# Patient Record
Sex: Female | Born: 1950
Health system: Southern US, Community
[De-identification: ages and names within clinical notes are randomized; demographics above are authoritative.]

## PROBLEM LIST (undated history)

## (undated) DIAGNOSIS — E669 Obesity, unspecified: Secondary | ICD-10-CM

## (undated) DIAGNOSIS — G4733 Obstructive sleep apnea (adult) (pediatric): Secondary | ICD-10-CM

## (undated) DIAGNOSIS — E785 Hyperlipidemia, unspecified: Secondary | ICD-10-CM

## (undated) DIAGNOSIS — I10 Essential (primary) hypertension: Secondary | ICD-10-CM

## (undated) DIAGNOSIS — E119 Type 2 diabetes mellitus without complications: Secondary | ICD-10-CM

## (undated) HISTORY — DX: Obstructive sleep apnea (adult) (pediatric): G47.33

## (undated) HISTORY — DX: Obesity, unspecified: E66.9

## (undated) HISTORY — PX: ABDOMINAL HYSTERECTOMY: SHX81

## (undated) HISTORY — DX: Hyperlipidemia, unspecified: E78.5

## (undated) HISTORY — DX: Essential (primary) hypertension: I10

## (undated) HISTORY — PX: COLONOSCOPY: SHX174

## (undated) HISTORY — DX: Type 2 diabetes mellitus without complications: E11.9

---

## 2004-08-26 ENCOUNTER — Ambulatory Visit: Payer: Self-pay

## 2005-06-21 HISTORY — PX: BILATERAL OOPHORECTOMY: SHX1221

## 2005-08-27 ENCOUNTER — Ambulatory Visit: Payer: Self-pay | Admitting: Otolaryngology

## 2005-09-09 ENCOUNTER — Ambulatory Visit: Payer: Self-pay | Admitting: Family Medicine

## 2005-09-15 ENCOUNTER — Other Ambulatory Visit: Payer: Self-pay

## 2005-09-23 ENCOUNTER — Inpatient Hospital Stay: Payer: Self-pay | Admitting: Unknown Physician Specialty

## 2005-10-13 ENCOUNTER — Ambulatory Visit: Payer: Self-pay | Admitting: Family Medicine

## 2006-09-14 ENCOUNTER — Ambulatory Visit: Payer: Self-pay | Admitting: Internal Medicine

## 2007-10-04 ENCOUNTER — Ambulatory Visit: Payer: Self-pay | Admitting: Internal Medicine

## 2008-12-18 ENCOUNTER — Ambulatory Visit: Payer: Self-pay | Admitting: Internal Medicine

## 2009-02-17 ENCOUNTER — Ambulatory Visit: Payer: Self-pay | Admitting: Family Medicine

## 2010-01-23 ENCOUNTER — Ambulatory Visit: Payer: Self-pay | Admitting: Internal Medicine

## 2010-07-12 ENCOUNTER — Encounter: Payer: Self-pay | Admitting: Nephrology

## 2011-02-09 ENCOUNTER — Telehealth: Payer: Self-pay | Admitting: Internal Medicine

## 2011-02-09 NOTE — Telephone Encounter (Signed)
Needs refill Citropram 20 mg, [facility] [address]

## 2011-02-10 NOTE — Telephone Encounter (Signed)
Patient called and needs a refill on her citalopram 20 mg one tablet daily.  Is this ok to refill?   I don't remember patient that is why I am asking.  Please advise.

## 2011-02-10 NOTE — Telephone Encounter (Signed)
Ok to refill citalopram with 3 refills

## 2011-02-11 MED ORDER — CITALOPRAM HYDROBROMIDE 20 MG PO TABS: 20.0000 mg | ORAL_TABLET | Freq: Every day | ORAL | Status: DC

## 2011-02-11 MED ORDER — AMLODIPINE BESYLATE 10 MG PO TABS: 10.0000 mg | ORAL_TABLET | Freq: Every day | ORAL | Status: DC

## 2011-02-15 ENCOUNTER — Telehealth: Payer: Self-pay | Admitting: Internal Medicine

## 2011-02-15 DIAGNOSIS — F329 Major depressive disorder, single episode, unspecified: Secondary | ICD-10-CM

## 2011-02-15 DIAGNOSIS — F32A Depression, unspecified: Secondary | ICD-10-CM

## 2011-02-15 MED ORDER — CITALOPRAM HYDROBROMIDE 20 MG PO TABS: ORAL_TABLET | ORAL | Status: DC

## 2011-02-15 NOTE — Telephone Encounter (Signed)
Patient called and stated the pharmacy will not refill the Rx we just sent in for citalopram 20 mg, because it being refilled too soon.  She stated you told her she could double up taking two tablets instead of one while on her period.  She said we have to send in a new Rx to the pharmacy for them to be able to refill it.  How does it need to be worded and I will send in it.  Please advise

## 2011-02-15 NOTE — Telephone Encounter (Signed)
Patient is requesting Rx to be changed, please contact patient ASAP

## 2011-02-15 NOTE — Telephone Encounter (Signed)
Dr Darrick Huntsman sent new rx for Celexa with an additional #7 per month (3 months equals 111) to pharmacy

## 2011-02-16 NOTE — Telephone Encounter (Signed)
Patient notified of Rx Refill

## 2011-03-08 ENCOUNTER — Telehealth: Payer: Self-pay | Admitting: Internal Medicine

## 2011-03-08 NOTE — Telephone Encounter (Signed)
Pt is faxing over  A statement from state health regarding her medication.  They need dr Darrick Huntsman approval stating that Penny Butler has to take medication. This needs to be done 03/21/11 The phone number  [phone]

## 2011-03-24 ENCOUNTER — Telehealth: Payer: Self-pay | Admitting: Internal Medicine

## 2011-03-24 NOTE — Telephone Encounter (Signed)
Opened in error

## 2011-06-01 ENCOUNTER — Encounter: Payer: Self-pay | Admitting: Internal Medicine

## 2011-06-01 ENCOUNTER — Ambulatory Visit (INDEPENDENT_AMBULATORY_CARE_PROVIDER_SITE_OTHER): Payer: BC Managed Care – PPO | Admitting: Internal Medicine

## 2011-06-01 VITALS — BP 118/64 | HR 96 | Temp 98.2°F | Resp 14 | Ht 61.0 in | Wt 171.2 lb

## 2011-06-01 DIAGNOSIS — E785 Hyperlipidemia, unspecified: Secondary | ICD-10-CM

## 2011-06-01 DIAGNOSIS — E119 Type 2 diabetes mellitus without complications: Secondary | ICD-10-CM

## 2011-06-01 DIAGNOSIS — E669 Obesity, unspecified: Secondary | ICD-10-CM

## 2011-06-01 DIAGNOSIS — I10 Essential (primary) hypertension: Secondary | ICD-10-CM | POA: Insufficient documentation

## 2011-06-01 DIAGNOSIS — E1169 Type 2 diabetes mellitus with other specified complication: Secondary | ICD-10-CM | POA: Insufficient documentation

## 2011-06-01 MED ORDER — PHENTERMINE HCL 37.5 MG PO TABS: 37.5000 mg | ORAL_TABLET | Freq: Every day | ORAL | Status: DC

## 2011-06-01 MED ORDER — AMLODIPINE BESYLATE 5 MG PO TABS: 5.0000 mg | ORAL_TABLET | Freq: Every day | ORAL | Status: DC

## 2011-06-01 MED ORDER — PHENTERMINE HCL 37.5 MG PO CAPS: 18.7500 mg | ORAL_CAPSULE | Freq: Two times a day (BID) | ORAL | Status: DC

## 2011-06-01 NOTE — Progress Notes (Signed)
  Subjective:    Patient ID: Penny Butler, female    DOB: Oct 28, 1950, 60 y.o.   MRN: [id number]  HPI Penny Butler is a 60 yo AA female with a history of boederline diabetes, hypertension and obesity who has been taking phentermine, metformin and citalopram as patt of a comprehesive weight loss program for the past 6 months.  She began exercising 2 months ago using the [employer] [facility].  She has no chest pain , elevated blood pressure, or tachycardia.  She has felt light headed for the last several days.  Past Medical History  Diagnosis Date  . Hypertension   . Obesity    Current Outpatient Prescriptions on File Prior to Visit  Medication Sig Dispense Refill  . citalopram (CELEXA) 20 MG tablet Take 1 tablet daily, and 2 daily during menstrual period  111 tablet  3       Review of Systems  Constitutional: Negative for fever, chills and unexpected weight change.  HENT: Negative for hearing loss, ear pain, nosebleeds, congestion, sore throat, facial swelling, rhinorrhea, sneezing, mouth sores, trouble swallowing, neck pain, neck stiffness, voice change, postnasal drip, sinus pressure, tinnitus and ear discharge.   Eyes: Negative for pain, discharge, redness and visual disturbance.  Respiratory: Negative for cough, chest tightness, shortness of breath, wheezing and stridor.   Cardiovascular: Negative for chest pain, palpitations and leg swelling.  Musculoskeletal: Negative for myalgias and arthralgias.  Skin: Negative for color change and rash.  Neurological: Negative for dizziness, weakness, light-headedness and headaches.  Hematological: Negative for adenopathy.   BP 118/64  Pulse 96  Temp(Src) 98.2 F (36.8 C) (Oral)  Resp 14  Ht 5\' 1"  (1.549 m)  Wt 171 lb 4 oz (77.678 kg)  BMI 32.36 kg/m2  SpO2 98%     Objective:   Physical Exam  Constitutional: She is oriented to person, place, and time. She appears well-developed and well-nourished.  HENT:  Mouth/Throat: Oropharynx is  clear and moist.  Eyes: EOM are normal. Pupils are equal, round, and reactive to light. No scleral icterus.  Neck: Normal range of motion. Neck supple. No JVD present. No thyromegaly present.  Cardiovascular: Normal rate, regular rhythm, normal heart sounds and intact distal pulses.   Pulmonary/Chest: Effort normal and breath sounds normal.  Abdominal: Soft. Bowel sounds are normal. She exhibits no mass. There is no tenderness.  Musculoskeletal: Normal range of motion. She exhibits no edema.  Lymphadenopathy:    She has no cervical adenopathy.  Neurological: She is alert and oriented to person, place, and time.  Skin: Skin is warm and dry.  Psychiatric: She has a normal mood and affect.       Assessment & Plan:

## 2011-06-01 NOTE — Assessment & Plan Note (Signed)
She has had hypertriglyceridemia and low HDL that she has been treating witih diet and exercise..  Repeat lipids due

## 2011-06-01 NOTE — Patient Instructions (Addendum)
Reduce the amlodipine to 5 mg daily for blood pressure   Return next Thursday for fasting labs including A1c and cholesterol

## 2011-06-01 NOTE — Assessment & Plan Note (Signed)
Improved with weight loss.  Reduce amlodipine to 5 mg daily.

## 2011-06-01 NOTE — Assessment & Plan Note (Signed)
Improving with use of phentermine.  She has lost 9 lbs sine July and is now exercising 4 days per week .Marland Kitchen

## 2011-06-02 ENCOUNTER — Encounter: Payer: Self-pay | Admitting: Internal Medicine

## 2011-06-02 DIAGNOSIS — G4733 Obstructive sleep apnea (adult) (pediatric): Secondary | ICD-10-CM | POA: Insufficient documentation

## 2011-06-10 ENCOUNTER — Other Ambulatory Visit (INDEPENDENT_AMBULATORY_CARE_PROVIDER_SITE_OTHER): Payer: Self-pay | Admitting: *Deleted

## 2011-06-10 DIAGNOSIS — E119 Type 2 diabetes mellitus without complications: Secondary | ICD-10-CM

## 2011-06-10 LAB — COMPREHENSIVE METABOLIC PANEL
ALT: 21 U/L (ref 0–35)
Albumin: 4.1 g/dL (ref 3.5–5.2)
Alkaline Phosphatase: 83 U/L (ref 39–117)
CO2: 26 mEq/L (ref 19–32)
GFR: 77.7 mL/min (ref >60.00)
Potassium: 3.7 mEq/L (ref 3.5–5.1)
Sodium: 141 mEq/L (ref 135–145)
Total Bilirubin: 0.5 mg/dL (ref 0.3–1.2)
Total Protein: 6.9 g/dL (ref 6.0–8.3)

## 2011-06-10 LAB — LDL CHOLESTEROL, DIRECT: Direct LDL: 178.4 mg/dL

## 2011-06-10 LAB — LIPID PANEL: HDL: 50.9 mg/dL (ref >39.00)

## 2011-06-10 LAB — HEMOGLOBIN A1C: Hgb A1c MFr Bld: 5.4 % (ref 4.6–6.5)

## 2011-07-13 ENCOUNTER — Telehealth: Payer: Self-pay | Admitting: Internal Medicine

## 2011-07-13 DIAGNOSIS — J310 Chronic rhinitis: Secondary | ICD-10-CM

## 2011-07-13 MED ORDER — FLUTICASONE PROPIONATE 50 MCG/ACT NA SUSP: 2.0000 | Freq: Every day | NASAL | Status: DC

## 2011-07-13 NOTE — Telephone Encounter (Signed)
I have sent an rx for a [facility] spray [facility].  1 squirt in each nostril daily

## 2011-07-13 NOTE — Telephone Encounter (Signed)
Patient having a lot of nasal drainage nothing over the counter is helping, wanting something called into the pharmacy.

## 2011-08-01 ENCOUNTER — Other Ambulatory Visit: Payer: Self-pay | Admitting: Internal Medicine

## 2011-08-02 ENCOUNTER — Telehealth: Payer: Self-pay | Admitting: Internal Medicine

## 2011-08-02 MED ORDER — METFORMIN HCL 500 MG PO TABS: ORAL_TABLET | ORAL | Status: DC

## 2011-08-02 NOTE — Telephone Encounter (Signed)
Metformin er 500mg  tablet   Take 1 at bedtime   90 supply   [facility] [address] Pt only has 1 pill left  [phone]

## 2011-09-23 ENCOUNTER — Ambulatory Visit: Payer: Self-pay | Admitting: Internal Medicine

## 2011-09-30 ENCOUNTER — Telehealth: Payer: Self-pay | Admitting: Internal Medicine

## 2011-09-30 NOTE — Telephone Encounter (Signed)
Left message asking patient to return my call, I called to notify her that her mammogram is normal.

## 2011-10-05 ENCOUNTER — Encounter: Payer: Self-pay | Admitting: Internal Medicine

## 2011-10-11 ENCOUNTER — Telehealth: Payer: Self-pay | Admitting: Internal Medicine

## 2011-10-11 NOTE — Telephone Encounter (Signed)
Patient notified her mammogram was normal.

## 2011-12-30 ENCOUNTER — Encounter: Payer: Self-pay | Admitting: Internal Medicine

## 2011-12-30 ENCOUNTER — Ambulatory Visit (INDEPENDENT_AMBULATORY_CARE_PROVIDER_SITE_OTHER): Payer: BC Managed Care – PPO | Admitting: Internal Medicine

## 2011-12-30 VITALS — BP 128/80 | HR 78 | Temp 98.4°F | Resp 16 | Wt 164.8 lb

## 2011-12-30 DIAGNOSIS — E669 Obesity, unspecified: Secondary | ICD-10-CM

## 2011-12-30 DIAGNOSIS — I10 Essential (primary) hypertension: Secondary | ICD-10-CM

## 2011-12-30 DIAGNOSIS — E785 Hyperlipidemia, unspecified: Secondary | ICD-10-CM

## 2011-12-30 LAB — LIPID PANEL
Cholesterol: 266 mg/dL — ABNORMAL HIGH (ref 0–200)
LDL Cholesterol: 190 mg/dL — ABNORMAL HIGH (ref 0–99)
Triglycerides: 110 mg/dL (ref <150)

## 2011-12-30 NOTE — Progress Notes (Signed)
Patient ID: Penny Butler, female   DOB: [birth year], 61 y.o.   MRN: [id number]  Patient Active Problem List  Diagnosis  . Obesity (BMI 30-39.9)  . Other and unspecified hyperlipidemia  . Hypertension  . OSA (obstructive sleep apnea)    Subjective:  CC:   Chief Complaint  Patient presents with  . Follow-up    HPI:   Penny Bambach Cooperis a 61 y.o. female who presents for  follow up on obesity, hypertension and hyperlipidemia.  She continues to lose weight using a low glycemic index diet and regular exercise.Marland Kitchen Has decreased her use of phentermine to once or twice a month but has resumed metformin ER 500 mg twice daily because her wt loss has slowed down compared to previous months.  Feels great, no diarrhea, joint pain, abdominal pain.  Had blood drawn for fasting lipids today.   Past Medical History  Diagnosis Date  . Hypertension   . Obesity   . OSA (obstructive sleep apnea)     now off of CAPAP without symptoms     Past Surgical History  Procedure Date  . Abdominal hysterectomy   . Bilateral oophorectomy 2007    [place]    The following portions of the patient's history were reviewed and updated as appropriate: Allergies, current medications, and problem list.    Review of Systems:  Comprehensive  review of systems was negative .     History   Social History  . Marital Status: Married    Spouse Name: N/A    Number of Children: N/A  . Years of Education: N/A   Occupational History  . Not on file.   Social History Main Topics  . Smoking status: Never Smoker   . Smokeless tobacco: Never Used  . Alcohol Use: Yes  . Drug Use: No  . Sexually Active: Yes   Other Topics Concern  . Not on file   Social History Narrative  . No narrative on file    Objective:  BP 128/80  Pulse 78  Temp 98.4 F (36.9 C) (Oral)  Resp 16  Wt 164 lb 12 oz (74.73 kg)  SpO2 95%  General appearance: alert, cooperative and appears stated age Ears: normal TM's and  external ear canals both ears Throat: lips, mucosa, and tongue normal; teeth and gums normal Neck: no adenopathy, no carotid bruit, supple, symmetrical, trachea midline and thyroid not enlarged, symmetric, no tenderness/mass/nodules Back: symmetric, no curvature. ROM normal. No CVA tenderness. Lungs: clear to auscultation bilaterally Heart: regular rate and rhythm, S1, S2 normal, no murmur, click, rub or gallop Abdomen: soft, non-tender; bowel sounds normal; no masses,  no organomegaly Pulses: 2+ and symmetric Skin: Skin color, texture, turgor normal. No rashes or lesions Lymph nodes: Cervical, supraclavicular, and axillary nodes normal.  Assessment and Plan:  Hypertension Improving with wt loss.  At prior visit amlodipine dose was reduced to 5 mg daily.  Continue for now,  Drop to 2.5 mg for persistent systolics < 120  Other and unspecified hyperlipidemia Despite wt loss LDL reamins high at 190.  Recommending trial of red yeast rice  Obesity (BMI 30-39.9) Improving steadily, BMI now 31.  contineu diet and exercise and use of phentermine reviewed.    Updated Medication List Outpatient Encounter Prescriptions as of 12/30/2011  Medication Sig Dispense Refill  . amLODipine (NORVASC) 5 MG tablet Take 1 tablet (5 mg total) by mouth daily.  90 tablet  1  . citalopram (CELEXA) 20 MG tablet Take 1 tablet daily,  and 2 daily during menstrual period  111 tablet  3  . metFORMIN (GLUCOPHAGE-XR) 500 MG 24 hr tablet TAKE ONE TABLET BY MOUTH AT BEDTIME  90 tablet  3  . phentermine 37.5 MG capsule Take 37.5 mg by mouth every morning.      Marland Kitchen DISCONTD: fish oil-omega-3 fatty acids 1000 MG capsule Take 2 g by mouth daily.        Marland Kitchen DISCONTD: fluticasone (FLONASE) 50 MCG/ACT nasal spray Place 2 sprays into the nose daily.  1 g  5  . DISCONTD: metFORMIN (GLUCOPHAGE) 500 MG tablet Take one tablet at bedtime.  90 tablet  1     Orders Placed This Encounter  Procedures  . Lipid panel    No Follow-up on  file.

## 2011-12-30 NOTE — Patient Instructions (Signed)
If bp starts to stay below 120/80,  You can reduce the amlodipine to 1/2 tablet or 2.5  Mg daily

## 2011-12-31 NOTE — Assessment & Plan Note (Signed)
Despite wt loss LDL reamins high at 190.  Recommending trial of red yeast rice

## 2011-12-31 NOTE — Assessment & Plan Note (Signed)
Improving with wt loss.  At prior visit amlodipine dose was reduced to 5 mg daily.  Continue for now,  Drop to 2.5 mg for persistent systolics < 120

## 2011-12-31 NOTE — Assessment & Plan Note (Signed)
Improving steadily, BMI now 31.  contineu diet and exercise and use of phentermine reviewed.

## 2012-01-03 ENCOUNTER — Telehealth: Payer: Self-pay | Admitting: *Deleted

## 2012-01-03 NOTE — Telephone Encounter (Signed)
Pt called back regarding results of labs, pt informed of lab results and of MD's advisement.

## 2012-01-04 MED ORDER — RED YEAST RICE EXTRACT 600 MG PO CAPS: 1.0000 | ORAL_CAPSULE | Freq: Two times a day (BID) | ORAL | Status: DC

## 2012-01-04 NOTE — Addendum Note (Signed)
Addended by: Duncan Dull on: 01/04/2012 05:59 PM   Modules accepted: Orders

## 2012-01-26 ENCOUNTER — Other Ambulatory Visit: Payer: Self-pay | Admitting: Internal Medicine

## 2012-01-27 ENCOUNTER — Other Ambulatory Visit: Payer: Self-pay | Admitting: Internal Medicine

## 2012-01-27 NOTE — Telephone Encounter (Signed)
I'm not sure what voice mail she is referring to,  I do not give my cell phone number to patients .  Please remind patientas about our refill policy

## 2012-01-27 NOTE — Telephone Encounter (Signed)
Rx called to [facility]

## 2012-01-27 NOTE — Telephone Encounter (Signed)
Patient reminded.  Please advise on refill.

## 2012-01-27 NOTE — Telephone Encounter (Signed)
Pt called checking on rx of phentermine  Pt stated she left detailed message on your voice mail yesterday [facility] [address] Pt checked with drug store this morning and they didn't have rx Please advise pt when called in

## 2012-01-28 MED ORDER — PHENTERMINE HCL 37.5 MG PO CAPS: 37.5000 mg | ORAL_CAPSULE | ORAL | Status: DC

## 2012-01-28 NOTE — Telephone Encounter (Signed)
Rx called in ,. Patient notified

## 2012-01-28 NOTE — Addendum Note (Signed)
Addended by: Darletta Moll A on: 01/28/2012 08:51 AM   Modules accepted: Orders

## 2012-01-28 NOTE — Telephone Encounter (Signed)
Ok to refill for 30 days with 2 refills

## 2012-04-03 ENCOUNTER — Other Ambulatory Visit: Payer: Self-pay | Admitting: Internal Medicine

## 2012-04-03 NOTE — Telephone Encounter (Signed)
Pt was calling back about rx she is wondering if she could get it called in today if possible.

## 2012-04-04 ENCOUNTER — Other Ambulatory Visit: Payer: Self-pay | Admitting: *Deleted

## 2012-04-04 MED ORDER — CITALOPRAM HYDROBROMIDE 20 MG PO TABS: ORAL_TABLET | ORAL | Status: DC

## 2012-04-05 ENCOUNTER — Other Ambulatory Visit: Payer: Self-pay

## 2012-05-03 ENCOUNTER — Other Ambulatory Visit: Payer: Self-pay | Admitting: Internal Medicine

## 2012-05-04 NOTE — Telephone Encounter (Signed)
Refill request for Amlodipine 5 mg # 90 0 R sent to [facility]

## 2012-06-28 ENCOUNTER — Telehealth: Payer: Self-pay | Admitting: General Practice

## 2012-06-28 ENCOUNTER — Encounter: Payer: Self-pay | Admitting: General Practice

## 2012-06-28 MED ORDER — PHENTERMINE HCL 37.5 MG PO TABS: 18.0000 mg | ORAL_TABLET | Freq: Two times a day (BID) | ORAL | Status: DC

## 2012-06-28 NOTE — Telephone Encounter (Signed)
Pt wants to know due to severe PMS she would like to know if she could try Belviq. Has not been on phentermine in months. Cravings are so severe do we prescribe this new med. If not can she get back on Phentermine.

## 2012-06-28 NOTE — Telephone Encounter (Signed)
I have not used Belviq yet, so am not comfortable with it. Will authorize phentermine.  Ok to refill,  Authorized in epic

## 2012-06-29 NOTE — Telephone Encounter (Signed)
Phentermin called in to pharmacy.

## 2012-07-19 ENCOUNTER — Other Ambulatory Visit: Payer: Self-pay | Admitting: Internal Medicine

## 2012-09-05 ENCOUNTER — Other Ambulatory Visit: Payer: Self-pay | Admitting: Internal Medicine

## 2012-09-05 NOTE — Telephone Encounter (Signed)
Patient needs CMET prior to refill of any more of medication  It has been over a year.

## 2012-09-07 NOTE — Telephone Encounter (Signed)
Med filled. Pt needs OV for additional refills.

## 2012-09-08 ENCOUNTER — Other Ambulatory Visit (INDEPENDENT_AMBULATORY_CARE_PROVIDER_SITE_OTHER): Payer: BC Managed Care – PPO

## 2012-09-08 DIAGNOSIS — E785 Hyperlipidemia, unspecified: Secondary | ICD-10-CM

## 2012-09-08 LAB — COMPREHENSIVE METABOLIC PANEL
ALT: 29 U/L (ref 0–35)
AST: 24 U/L (ref 0–37)
Albumin: 3.8 g/dL (ref 3.5–5.2)
BUN: 17 mg/dL (ref 6–23)
Calcium: 9.3 mg/dL (ref 8.4–10.5)
Chloride: 106 mEq/L (ref 96–112)
Potassium: 3.9 mEq/L (ref 3.5–5.1)
Sodium: 139 mEq/L (ref 135–145)
Total Protein: 6.8 g/dL (ref 6.0–8.3)

## 2012-09-08 LAB — LIPID PANEL: Triglycerides: 81 mg/dL (ref 0.0–149.0)

## 2012-09-08 LAB — LDL CHOLESTEROL, DIRECT: Direct LDL: 194.2 mg/dL

## 2012-09-11 ENCOUNTER — Encounter: Payer: Self-pay | Admitting: General Practice

## 2012-10-02 ENCOUNTER — Other Ambulatory Visit: Payer: Self-pay | Admitting: Internal Medicine

## 2012-10-02 NOTE — Telephone Encounter (Signed)
Pt last seen on 12/2011. Please advise

## 2012-11-23 ENCOUNTER — Ambulatory Visit: Payer: Self-pay | Admitting: Internal Medicine

## 2013-01-01 ENCOUNTER — Other Ambulatory Visit: Payer: Self-pay | Admitting: Internal Medicine

## 2013-01-05 ENCOUNTER — Encounter: Payer: Self-pay | Admitting: Internal Medicine

## 2013-03-13 ENCOUNTER — Other Ambulatory Visit: Payer: Self-pay | Admitting: Internal Medicine

## 2013-03-13 NOTE — Telephone Encounter (Signed)
Last seen on 12/30/11-okay to refill? No future appt scheduled

## 2013-07-30 ENCOUNTER — Other Ambulatory Visit: Payer: Self-pay | Admitting: Internal Medicine

## 2013-07-30 ENCOUNTER — Telehealth: Payer: Self-pay | Admitting: Internal Medicine

## 2013-07-30 NOTE — Telephone Encounter (Signed)
The patient only has one pill left  amLODipine (NORVASC) 5 MG tablet

## 2013-07-31 NOTE — Telephone Encounter (Signed)
Last visit 12/30/11, refill?

## 2013-08-01 MED ORDER — AMLODIPINE BESYLATE 5 MG PO TABS: ORAL_TABLET | ORAL | Status: DC

## 2013-08-01 NOTE — Telephone Encounter (Signed)
Refill one 30 days only.  Has not been seen in over one year so she needs a  30 minute visit.

## 2013-08-01 NOTE — Telephone Encounter (Signed)
Patient notified

## 2013-08-01 NOTE — Telephone Encounter (Signed)
Please advise may I refill.

## 2013-08-01 NOTE — Telephone Encounter (Signed)
Patient called inquiring about refill,Patient notified she needs to schedule appointment.May refill?

## 2013-08-01 NOTE — Telephone Encounter (Signed)
Patient called back to follow up about medication she is scheduled for next Tuesday at 1:15. Please advise her when her medication has been called in.

## 2013-08-01 NOTE — Telephone Encounter (Signed)
refill sent 

## 2013-08-07 ENCOUNTER — Ambulatory Visit: Payer: BC Managed Care – PPO | Admitting: Internal Medicine

## 2013-08-09 ENCOUNTER — Encounter: Payer: Self-pay | Admitting: Internal Medicine

## 2013-08-09 ENCOUNTER — Ambulatory Visit (INDEPENDENT_AMBULATORY_CARE_PROVIDER_SITE_OTHER): Payer: BC Managed Care – PPO | Admitting: Internal Medicine

## 2013-08-09 ENCOUNTER — Encounter (INDEPENDENT_AMBULATORY_CARE_PROVIDER_SITE_OTHER): Payer: Self-pay

## 2013-08-09 VITALS — BP 136/82 | HR 83 | Temp 98.6°F | Resp 16 | Wt 187.5 lb

## 2013-08-09 DIAGNOSIS — Z90722 Acquired absence of ovaries, bilateral: Secondary | ICD-10-CM

## 2013-08-09 DIAGNOSIS — Z1239 Encounter for other screening for malignant neoplasm of breast: Secondary | ICD-10-CM

## 2013-08-09 DIAGNOSIS — N951 Menopausal and female climacteric states: Secondary | ICD-10-CM

## 2013-08-09 DIAGNOSIS — I10 Essential (primary) hypertension: Secondary | ICD-10-CM

## 2013-08-09 DIAGNOSIS — E785 Hyperlipidemia, unspecified: Secondary | ICD-10-CM

## 2013-08-09 DIAGNOSIS — E669 Obesity, unspecified: Secondary | ICD-10-CM

## 2013-08-09 DIAGNOSIS — Z90710 Acquired absence of both cervix and uterus: Secondary | ICD-10-CM

## 2013-08-09 DIAGNOSIS — E559 Vitamin D deficiency, unspecified: Secondary | ICD-10-CM

## 2013-08-09 DIAGNOSIS — G4733 Obstructive sleep apnea (adult) (pediatric): Secondary | ICD-10-CM

## 2013-08-09 DIAGNOSIS — Z9079 Acquired absence of other genital organ(s): Secondary | ICD-10-CM

## 2013-08-09 LAB — LDL CHOLESTEROL, DIRECT: Direct LDL: 192.6 mg/dL

## 2013-08-09 LAB — COMPREHENSIVE METABOLIC PANEL
ALBUMIN: 4.4 g/dL (ref 3.5–5.2)
ALK PHOS: 90 U/L (ref 39–117)
ALT: 28 U/L (ref 0–35)
AST: 25 U/L (ref 0–37)
BUN: 25 mg/dL — AB (ref 6–23)
CALCIUM: 10.1 mg/dL (ref 8.4–10.5)
CHLORIDE: 108 meq/L (ref 96–112)
CO2: 25 mEq/L (ref 19–32)
Creatinine, Ser: 0.9 mg/dL (ref 0.4–1.2)
GFR: 83.16 mL/min (ref >60.00)
Glucose, Bld: 97 mg/dL (ref 70–99)
POTASSIUM: 4.2 meq/L (ref 3.5–5.1)
SODIUM: 142 meq/L (ref 135–145)
TOTAL PROTEIN: 7.7 g/dL (ref 6.0–8.3)
Total Bilirubin: 0.3 mg/dL (ref 0.3–1.2)

## 2013-08-09 LAB — TSH: TSH: 2.04 u[IU]/mL (ref 0.35–5.50)

## 2013-08-09 LAB — HEMOGLOBIN A1C: Hgb A1c MFr Bld: 5.7 % (ref 4.6–6.5)

## 2013-08-09 LAB — MICROALBUMIN / CREATININE URINE RATIO
Creatinine,U: 251.9 mg/dL
MICROALB UR: 1.2 mg/dL (ref 0.0–1.9)
Microalb Creat Ratio: 0.5 mg/g (ref 0.0–30.0)

## 2013-08-09 NOTE — Patient Instructions (Addendum)
I recommend taking 2 or 3 Fibercon capsules with a full glass of water or coffee 30 minutes before your main meals to help curb your appetite (they will expand in your stomach and fill you up.)  You need to find a "buddy" or a regular exercise program to stick to and maintain  4 or 5 days per week   I  will send you the contact information for my trainer, Nichelle Crump-Murray.  She lives in [place] and has a variety of programs,  Including on line exercise plans (try "you tubing"  "[employer], " which is her company)  This is my version of a  "Low GI"  Diet:  It will still lower your blood sugars and allow you to lose 4 to 8  lbs  per month if you follow it carefully.  Your goal with exercise is a minimum of 30 minutes of aerobic exercise 5 days per week (Walking does not count once it becomes easy!)    All of the foods can be found at grocery stores and in bulk at [employer].  The Atkins protein bars and shakes are available in more varieties at Target, [employer]Mart and [employer].     7 AM Breakfast:  Choose from the following:  Low carbohydrate Protein  Shakes (I recommend the [employer]vantEdge "Carb Control" shakes  Or the low carb shakes by Atkins.    2.5 carbs   Arnold's "Sandwhich Thin"toasted  w/ peanut butter (no jelly: about 20 net carbs  "Bagel Thin" with cream cheese and salmon: about 20 carbs   a scrambled egg/bacon/cheese burrito made with Mission's "carb balance" whole wheat tortilla  (about 10 net carbs )   Avoid cereal and bananas, oatmeal and cream of wheat and grits. They are loaded with carbohydrates!   10 AM: high protein snack  Protein bar by Atkins (the snack size, under 200 cal, usually < 6 net carbs).    A stick of cheese:  Around 1 carb,  100 cal     Dannon Light n Fit [place] Yogurt  (80 cal, 8 carbs)  Other so called "protein bars" and Greek yogurts tend to be loaded with carbohydrates.  Remember, in food advertising, the word "energy" is synonymous for "  carbohydrate."  Lunch:   A Sandwich using the bread choices listed, Can use any  Eggs,  lunchmeat, grilled meat or canned tuna), avocado, regular mayo/mustard  and cheese.  A Salad using blue cheese, ranch,  Goddess or vinagrette,  No croutons or "confetti" and no "candied nuts" but regular nuts OK.   No pretzels or chips.  Pickles and miniature sweet peppers are a good low carb alternative that provide a "crunch"  The bread is the only source of carbohydrate in a sandwich and  can be decreased by trying some of these alternatives to traditional loaf bread  Joseph's makes a pita bread and a flat bread that are 50 cal and 4 net carbs available at BJs and WalMart.  This can be toasted to use with hummous as well  Toufayan makes a low carb flatbread that's 100 cal and 9 net carbs available at [employer] and [employer] makes 2 sizes of  Low carb whole wheat tortilla  (The large one is 210 cal and 6 net carbs) Avoid "Low fat dressings, as well as Reyne Dumas and [address] dressings They are loaded with sugar!   3 PM/ Mid day  Snack:  Consider  1 ounce  of  almonds, walnuts, pistachios, pecans, peanuts,  Macadamia nuts or a nut medley.  Avoid "granola"; the dried cranberries and raisins are loaded with carbohydrates. Mixed nuts as long as there are no raisins,  cranberries or dried fruit.     6 PM  Dinner:     Meat/fowl/fish with a green salad, and either broccoli, cauliflower, green beans, spinach, brussel sprouts or  Lima beans. DO NOT BREAD THE PROTEIN!!      There is a low carb pasta by Dreamfield's that is acceptable and tastes great: only 5 digestible carbs/serving.( All grocery stores but BJs carry it )  Try Kai LevinsMichel Angelo's chicken piccata or chicken or eggplant parm over low carb pasta.(Lowes and BJs)   Clifton CustardAaron Sanchez's "Carnitas" (pulled pork, no sauce,  0 carbs) or his beef pot roast to make a dinner burrito (at BJ's)  Pesto over low carb pasta (bj's sells a good quality pesto in the  center refrigerated section of the deli   Whole wheat pasta is still full of digestible carbs and  Not as low in glycemic index as Dreamfield's.   Brown rice is still rice,  So skip the rice and noodles if you eat Congohinese or [place]hai (or at least limit to 1/2 cup)  9 PM snack :   Breyer's "low carb" fudgsicle or  ice cream bar (Carb Smart line), or  Weight Watcher's ice cream bar , or another "no sugar added" ice cream;  a serving of fresh berries/cherries with whipped cream   Cheese or DANNON'S LlGHT [employer] FIT [place] YOGURT  Avoid bananas, pineapple, grapes  and watermelon on a regular basis because they are high in sugar.  THINK OF THEM AS DESSERT  Remember that snack Substitutions should be less than 10 NET carbs per serving and meals < 20 carbs. Remember to subtract fiber grams to get the "net carbs."

## 2013-08-09 NOTE — Progress Notes (Signed)
Patient ID: Penny Butler, female   DOB: 12-Dec-1950, 63 y.o.   MRN: [id number]  .    Patient Active Problem List   Diagnosis Date Noted  . OSA (obstructive sleep apnea)   . Obesity (BMI 30-39.9) 06/01/2011  . Other and unspecified hyperlipidemia 06/01/2011  . Hypertension 06/01/2011    Subjective:  CC:   Chief Complaint  Patient presents with  . Follow-up    medication    HPI:   Penny Butler is a 63 y.o. female who presents for  Follow up on chronic conditions including obesity, hyperlipidemia and  Hypertension.  She was last  seen July 2013.  Has gained > 20 lbs since last visit, BMI is now 35   She is following a low carb diet, 5 to 6  Smaller meals daily , but not exercising regularly.  She had success losing weight while on phentermine but when she stopped it, her appetite returned.  She has  tried other meds from the bariatric center in the past.   Menopausal syndrome has become very distressing due to periodic food cravings, voriacious appetite  accompanied by breast tenderness and bloating.  She is using natural progesterone spray which is helping the hot flashes.  .     Past Medical History  Diagnosis Date  . Hypertension   . Obesity   . OSA (obstructive sleep apnea)     now off of CAPAP without symptoms     Past Surgical History  Procedure Laterality Date  . Abdominal hysterectomy    . Bilateral oophorectomy  2007    [place]       The following portions of the patient's history were reviewed and updated as appropriate: Allergies, current medications, and problem list.    Review of Systems:   Patient denies headache, fevers, malaise, unintentional weight loss, skin rash, eye pain, sinus congestion and sinus pain, sore throat, dysphagia,  hemoptysis , cough, dyspnea, wheezing, chest pain, palpitations, orthopnea, edema, abdominal pain, nausea, melena, diarrhea, constipation, flank pain, dysuria, hematuria, urinary  Frequency, nocturia, numbness,  tingling, seizures,  Focal weakness, Loss of consciousness,  Tremor, insomnia, depression, anxiety, and suicidal ideation.     History   Social History  . Marital Status: Married    Spouse Name: N/A    Number of Children: N/A  . Years of Education: N/A   Occupational History  . Not on file.   Social History Main Topics  . Smoking status: Never Smoker   . Smokeless tobacco: Never Used  . Alcohol Use: Yes  . Drug Use: No  . Sexual Activity: Yes   Other Topics Concern  . Not on file   Social History Narrative  . No narrative on file    Objective:  Filed Vitals:   08/09/13 1122  BP: 136/82  Pulse: 83  Temp: 98.6 F (37 C)  Resp: 16     General appearance: alert, cooperative and appears stated age Ears: normal TM's and external ear canals both ears Throat: lips, mucosa, and tongue normal; teeth and gums normal Neck: no adenopathy, no carotid bruit, supple, symmetrical, trachea midline and thyroid not enlarged, symmetric, no tenderness/mass/nodules Back: symmetric, no curvature. ROM normal. No CVA tenderness. Lungs: clear to auscultation bilaterally Heart: regular rate and rhythm, S1, S2 normal, no murmur, click, rub or gallop Abdomen: soft, non-tender; bowel sounds normal; no masses,  no organomegaly Pulses: 2+ and symmetric Skin: Skin color, texture, turgor normal. No rashes or lesions Lymph nodes: Cervical, supraclavicular, and  axillary nodes normal.  Assessment and Plan:  Hypertension Well controlled on current regimen. Renal function stable, no changes today.  Lab Results  Component Value Date   CREATININE 0.9 08/09/2013   Lab Results  Component Value Date   NA 142 08/09/2013   K 4.2 08/09/2013   CL 108 08/09/2013   CO2 25 08/09/2013     Obesity (BMI 30-39.9) I have addressed  BMI and recommended wt loss of 10% of body weight over the next 6 months using a low glycemic index diet and regular exercise a minimum of 5 days per week. She does not want to  resume phentermine.  Recommended contineing metformin and a trial of fiber supplements 30 minutes prior to main meals to create early satiety    Other and unspecified hyperlipidemia No improvement with red yeast rice on LDL,  which remains > 190. Her 10 yr risk of CAD is 15% .  Will recommend low dose atorvastatin and baby aspirin   OSA (obstructive sleep apnea) Diagnosed by sleep study. She is wearing her CPAP every night a minimum of 6 hours per night.   Menopause syndrome Not well controlled on topical compounded progesterone .  Discussed trial of Brisdelle,  Samples given.    A total of 40 minutes was spent with patient more than half of which was spent in counseling, reviewing records from other prviders and coordination of care.  Updated Medication List Outpatient Encounter Prescriptions as of 08/09/2013  Medication Sig  . amLODipine (NORVASC) 5 MG tablet TAKE ONE TABLET BY MOUTH ONCE DAILY  . citalopram (CELEXA) 20 MG tablet TAKE ONE TABLET BY MOUTH EVERY DAY AND TWO TABLETS DAILY DURING MENSTRUAL PERIOD  . citalopram (CELEXA) 20 MG tablet TAKE ONE TABLET BY MOUTH EVERY DAY AND TWO TABLETS DAILY DURING MENSTRUAL PERIOD  . phentermine (ADIPEX-P) 37.5 MG tablet Take 0.5 tablets (18.75 mg total) by mouth 2 (two) times daily before a meal.  . metFORMIN (GLUCOPHAGE-XR) 500 MG 24 hr tablet TAKE ONE TABLET BY MOUTH AT BEDTIME. *NEEDS OFFICE VISIT FOR ADDITIONAL REFILLS*  . PARoxetine Mesylate (BRISDELLE) 7.5 MG CAPS Take 1 tablet by mouth daily.  . Red Yeast Rice Extract ([facility] RED YEAST RICE) 600 MG CAPS Take 1 capsule (600 mg total) by mouth 2 (two) times daily before a meal.

## 2013-08-10 ENCOUNTER — Telehealth: Payer: Self-pay | Admitting: Internal Medicine

## 2013-08-10 LAB — VITAMIN D 25 HYDROXY (VIT D DEFICIENCY, FRACTURES): Vit D, 25-Hydroxy: 83 ng/mL (ref 30–89)

## 2013-08-10 NOTE — Telephone Encounter (Signed)
Relevant patient education assigned to patient using Emmi. ° °

## 2013-08-11 ENCOUNTER — Encounter: Payer: Self-pay | Admitting: Internal Medicine

## 2013-08-11 DIAGNOSIS — Z90710 Acquired absence of both cervix and uterus: Secondary | ICD-10-CM | POA: Insufficient documentation

## 2013-08-11 DIAGNOSIS — Z9079 Acquired absence of other genital organ(s): Secondary | ICD-10-CM

## 2013-08-11 DIAGNOSIS — N951 Menopausal and female climacteric states: Secondary | ICD-10-CM | POA: Insufficient documentation

## 2013-08-11 DIAGNOSIS — Z90722 Acquired absence of ovaries, bilateral: Secondary | ICD-10-CM

## 2013-08-11 MED ORDER — PAROXETINE MESYLATE 7.5 MG PO CAPS
1.0000 | ORAL_CAPSULE | Freq: Every day | ORAL | Status: DC
Start: 2013-08-11 — End: 2013-11-16

## 2013-08-11 NOTE — Assessment & Plan Note (Signed)
No improvement with red yeast rice on LDL,  which remains > 190. Her 10 yr risk of CAD is 15% .  Will recommend low dose atorvastatin and baby aspirin

## 2013-08-11 NOTE — Assessment & Plan Note (Signed)
I have addressed  BMI and recommended wt loss of 10% of body weight over the next 6 months using a low glycemic index diet and regular exercise a minimum of 5 days per week. She does not want to resume phentermine

## 2013-08-11 NOTE — Assessment & Plan Note (Addendum)
Not well controlled on topical compounded progesterone .  Discussed trial of Brisdelle,  Samples given.

## 2013-08-11 NOTE — Assessment & Plan Note (Signed)
Well controlled on current regimen. Renal function stable, no changes today.  Lab Results  Component Value Date   CREATININE 0.9 08/09/2013   Lab Results  Component Value Date   NA 142 08/09/2013   K 4.2 08/09/2013   CL 108 08/09/2013   CO2 25 08/09/2013

## 2013-08-11 NOTE — Assessment & Plan Note (Signed)
Diagnosed by sleep study. She is wearing her CPAP every night a minimum of 6 hours per night.  

## 2013-08-13 ENCOUNTER — Encounter: Payer: Self-pay | Admitting: *Deleted

## 2013-08-13 ENCOUNTER — Encounter: Payer: Self-pay | Admitting: Internal Medicine

## 2013-08-13 MED ORDER — SIMVASTATIN 20 MG PO TABS: 20.0000 mg | ORAL_TABLET | Freq: Every day | ORAL | Status: DC

## 2013-08-13 NOTE — Addendum Note (Signed)
Addended by: Sherlene ShamsULLO, Constancia Geeting L on: 08/13/2013 05:44 PM   Modules accepted: Orders

## 2013-08-14 MED ORDER — LORCASERIN HCL 10 MG PO TABS: 1.0000 | ORAL_TABLET | Freq: Every day | ORAL | Status: DC

## 2013-08-14 NOTE — Telephone Encounter (Signed)
Patient requesting trial of belviq per last visit discussion,  Can it be called in? Let patient  know if not,

## 2013-08-15 NOTE — Telephone Encounter (Signed)
Rx faxed to pharmacy  

## 2013-08-23 ENCOUNTER — Encounter: Payer: Self-pay | Admitting: Internal Medicine

## 2013-09-10 ENCOUNTER — Encounter: Payer: Self-pay | Admitting: Internal Medicine

## 2013-09-11 ENCOUNTER — Encounter: Payer: Self-pay | Admitting: Internal Medicine

## 2013-10-14 ENCOUNTER — Other Ambulatory Visit: Payer: Self-pay | Admitting: Internal Medicine

## 2013-10-14 ENCOUNTER — Encounter: Payer: Self-pay | Admitting: Internal Medicine

## 2013-10-14 MED ORDER — METFORMIN HCL ER 500 MG PO TB24: ORAL_TABLET | ORAL | Status: DC

## 2013-10-16 ENCOUNTER — Other Ambulatory Visit: Payer: Self-pay | Admitting: Internal Medicine

## 2013-10-16 MED ORDER — METFORMIN HCL ER 500 MG PO TB24: ORAL_TABLET | ORAL | Status: DC

## 2013-10-24 ENCOUNTER — Telehealth: Payer: Self-pay | Admitting: Internal Medicine

## 2013-10-24 MED ORDER — AMLODIPINE BESYLATE 5 MG PO TABS: ORAL_TABLET | ORAL | Status: DC

## 2013-10-24 NOTE — Telephone Encounter (Signed)
Amlodipine has not been rejected, no refill request received. Rx sent to pharmacy by escript. Pt notified, left message on number provided below.

## 2013-10-24 NOTE — Telephone Encounter (Signed)
Pt called checking to see why her amlodpine was rejected.  Please advise  Pt needs rx sent to [facility] in [place]  She took last pill last night  Please let her know when this is called in   You can leave message  At work number   [phone] ext [phone]

## 2013-10-31 ENCOUNTER — Encounter: Payer: Self-pay | Admitting: Internal Medicine

## 2013-10-31 DIAGNOSIS — E669 Obesity, unspecified: Secondary | ICD-10-CM

## 2013-11-05 ENCOUNTER — Encounter: Payer: Self-pay | Admitting: Internal Medicine

## 2013-11-13 ENCOUNTER — Telehealth: Payer: Self-pay | Admitting: Internal Medicine

## 2013-11-13 NOTE — Telephone Encounter (Signed)
Patient is dissatisfied with the services she is receiving from the CPAP equipment she has been using [facility] in [place]. She states they haven't contacted her back and she needs the supplies called some where local. Please advise. Patient does have an appt this Friday.

## 2013-11-14 NOTE — Telephone Encounter (Signed)
Please advise supplies needed.

## 2013-11-14 NOTE — Telephone Encounter (Signed)
Please find out where and when her sleep study was done.,  Then  call [facility] and find out if we can order supplies for patiient  without a prior study from them

## 2013-11-16 ENCOUNTER — Ambulatory Visit (INDEPENDENT_AMBULATORY_CARE_PROVIDER_SITE_OTHER): Payer: BC Managed Care – PPO | Admitting: Internal Medicine

## 2013-11-16 ENCOUNTER — Encounter: Payer: Self-pay | Admitting: Internal Medicine

## 2013-11-16 VITALS — BP 148/80 | HR 98 | Temp 98.5°F | Resp 18 | Ht 60.0 in | Wt 191.5 lb

## 2013-11-16 DIAGNOSIS — E785 Hyperlipidemia, unspecified: Secondary | ICD-10-CM

## 2013-11-16 DIAGNOSIS — Z1211 Encounter for screening for malignant neoplasm of colon: Secondary | ICD-10-CM

## 2013-11-16 DIAGNOSIS — Z79899 Other long term (current) drug therapy: Secondary | ICD-10-CM

## 2013-11-16 DIAGNOSIS — I10 Essential (primary) hypertension: Secondary | ICD-10-CM

## 2013-11-16 DIAGNOSIS — G4733 Obstructive sleep apnea (adult) (pediatric): Secondary | ICD-10-CM

## 2013-11-16 DIAGNOSIS — E669 Obesity, unspecified: Secondary | ICD-10-CM

## 2013-11-16 NOTE — Progress Notes (Signed)
Pre-visit discussion using our clinic review tool. No additional management support is needed unless otherwise documented below in the visit note.  

## 2013-11-16 NOTE — Telephone Encounter (Signed)
[facility] sleep study, has been 9 years since last sleep study.FYI

## 2013-11-16 NOTE — Progress Notes (Signed)
Patient ID: Penny Butler, female   DOB: 1951/05/25, 63 y.o.   MRN: [id number]  Patient Active Problem List   Diagnosis Date Noted  . Menopause syndrome 08/11/2013  . S/P TAH-BSO (total abdominal hysterectomy and bilateral salpingo-oophorectomy) 08/11/2013  . OSA (obstructive sleep apnea)   . Obesity (BMI 30-39.9) 06/01/2011  . Other and unspecified hyperlipidemia 06/01/2011  . Hypertension 06/01/2011    Subjective:  CC:   Chief Complaint  Patient presents with  . Follow-up    3 month  . Hyperlipidemia    Red yeast rice    HPI:   Penny Butler is a 63 y.o. female who presents for Follow up on obesity ,  OSA and hypertension   Has not had a sleep study in 9 years,  Done at [facility],  may have been ordered by prior ENT doctor.   She is dissatisfied with the company supplying her CPAP supplies and wants to change providers.  Mask is ill fitting  [facility] [place].     2) wants to undergo bariatric surgery .  Has attended the seminar. Has to meet her deductible      Past Surgical History  Procedure Laterality Date  . Abdominal hysterectomy    . Bilateral oophorectomy  2007    [place]       The following portions of the patient's history were reviewed and updated as appropriate: Allergies, current medications, and problem list.    Review of Systems:   Patient denies headache, fevers, malaise, unintentional weight loss, skin rash, eye pain, sinus congestion and sinus pain, sore throat, dysphagia,  hemoptysis , cough, dyspnea, wheezing, chest pain, palpitations, orthopnea, edema, abdominal pain, nausea, melena, diarrhea, constipation, flank pain, dysuria, hematuria, urinary  Frequency, nocturia, numbness, tingling, seizures,  Focal weakness, Loss of consciousness,  Tremor, insomnia, depression, anxiety, and suicidal ideation.     History   Social History  . Marital Status: Married    Spouse Name: N/A    Number of Children: N/A  .  Years of Education: N/A   Occupational History  . Not on file.   Social History Main Topics  . Smoking status: Never Smoker   . Smokeless tobacco: Never Used  . Alcohol Use: Yes  . Drug Use: No  . Sexual Activity: Yes   Other Topics Concern  . Not on file   Social History Narrative  . No narrative on file    Objective:  Filed Vitals:   11/16/13 1418  BP: 148/80  Pulse: 98  Temp: 98.5 F (36.9 C)  Resp: 18     General appearance: alert, cooperative and appears stated age Ears: normal TM's and external ear canals both ears Throat: lips, mucosa, and tongue normal; teeth and gums normal Neck: no adenopathy, no carotid bruit, supple, symmetrical, trachea midline and thyroid not enlarged, symmetric, no tenderness/mass/nodules Back: symmetric, no curvature. ROM normal. No CVA tenderness. Lungs: clear to auscultation bilaterally Heart: regular rate and rhythm, S1, S2 normal, no murmur, click, rub or gallop Abdomen: soft, non-tender; bowel sounds normal; no masses,  no organomegaly Pulses: 2+ and symmetric Skin: Skin color, texture, turgor normal. No rashes or lesions Lymph nodes: Cervical, supraclavicular, and axillary nodes normal.  Assessment and Plan:  Other and unspecified hyperlipidemia Has been taking  red yeast rice  twice daily since last visit and will return for fasting lipids,  Untreated LDL was 190. Her 10 yyr risk of CAD is 15% .   Lab Results  Component Value Date   CHOL 256* 09/08/2012   HDL 48.60 09/08/2012   LDLCALC 190* 12/30/2011   LDLDIRECT 192.6 08/09/2013   TRIG 81.0 09/08/2012   CHOLHDL 5 09/08/2012    Hypertension Well controlled on current regimen. Renal function stable, no changes today.  Obesity (BMI 30-39.9) I have addressed  BMI and recommended a low glycemic index diet utilizing smaller more frequent meals to increase metabolism.  I have also recommended that patient start exercising with a goal of 30 minutes of aerobic exercise a minimum  of 5 days per week.  She is considering bariatric surgery    OSA (obstructive sleep apnea) Diagnosed by sleep study over 6 years ago at [facility]. . She is wearing her CPAP every night a minimum of 6 hours per night but is dissatisfied with her current supplier and is requesting referral to another provider.     Updated Medication List Outpatient Encounter Prescriptions as of 11/16/2013  Medication Sig  . amLODipine (NORVASC) 5 MG tablet TAKE ONE TABLET BY MOUTH ONCE DAILY  . citalopram (CELEXA) 20 MG tablet TAKE ONE TABLET BY MOUTH EVERY DAY AND TWO TABLETS DAILY DURING MENSTRUAL PERIOD  . citalopram (CELEXA) 20 MG tablet TAKE ONE TABLET BY MOUTH EVERY DAY AND TWO TABLETS DAILY DURING MENSTRUAL PERIOD  . metFORMIN (GLUCOPHAGE-XR) 500 MG 24 hr tablet TAKE ONE TABLET BY MOUTH AT BEDTIME.  . Red Yeast Rice Extract ([facility] RED YEAST RICE) 600 MG CAPS Take 1 capsule (600 mg total) by mouth 2 (two) times daily before a meal.  . Lorcaserin HCl 10 MG TABS Take 1 tablet by mouth daily at 12 noon.  . [DISCONTINUED] PARoxetine Mesylate (BRISDELLE) 7.5 MG CAPS Take 1 tablet by mouth daily.  . [DISCONTINUED] phentermine (ADIPEX-P) 37.5 MG tablet Take 0.5 tablets (18.75 mg total) by mouth 2 (two) times daily before a meal.  . [DISCONTINUED] simvastatin (ZOCOR) 20 MG tablet Take 1 tablet (20 mg total) by mouth at bedtime.     Orders Placed This Encounter  Procedures  . Comprehensive metabolic panel  . TSH  . Lipid panel  . Ambulatory referral to Gastroenterology    No Follow-up on file.

## 2013-11-18 ENCOUNTER — Encounter: Payer: Self-pay | Admitting: Internal Medicine

## 2013-11-18 NOTE — Assessment & Plan Note (Signed)
Well controlled on current regimen. Renal function stable, no changes today. 

## 2013-11-18 NOTE — Assessment & Plan Note (Signed)
I have addressed  BMI and recommended a low glycemic index diet utilizing smaller more frequent meals to increase metabolism.  I have also recommended that patient start exercising with a goal of 30 minutes of aerobic exercise a minimum of 5 days per week.  She is considering bariatric surgery

## 2013-11-18 NOTE — Assessment & Plan Note (Addendum)
Has been taking  red yeast rice  twice daily since last visit and will return for fasting lipids,  Untreated LDL was 190. Her 10 yyr risk of CAD is 15% .   Lab Results  Component Value Date   CHOL 256* 09/08/2012   HDL 48.60 09/08/2012   LDLCALC 190* 12/30/2011   LDLDIRECT 192.6 08/09/2013   TRIG 81.0 09/08/2012   CHOLHDL 5 09/08/2012

## 2013-11-18 NOTE — Assessment & Plan Note (Signed)
Diagnosed by sleep study over 6 years ago at [facility]. . She is wearing her CPAP every night a minimum of 6 hours per night but is dissatisfied with her current supplier and is requesting referral to another provider.

## 2013-11-19 ENCOUNTER — Encounter: Payer: Self-pay | Admitting: Internal Medicine

## 2013-11-20 ENCOUNTER — Telehealth: Payer: Self-pay | Admitting: Internal Medicine

## 2013-11-20 DIAGNOSIS — G4733 Obstructive sleep apnea (adult) (pediatric): Secondary | ICD-10-CM

## 2013-11-20 DIAGNOSIS — Z9989 Dependence on other enabling machines and devices: Principal | ICD-10-CM

## 2013-11-20 NOTE — Telephone Encounter (Signed)
The patient is needing an order for CPAP supplies and to check her CPAP machine with [facility].

## 2013-11-20 NOTE — Telephone Encounter (Signed)
Order made.  Patient will likely need to have another sleep study because it has been over 5 years and no prior recordds are available.

## 2013-11-20 NOTE — Telephone Encounter (Signed)
Please advise 

## 2013-11-21 NOTE — Telephone Encounter (Signed)
Please let patient know,.  i will set up sleep study if she wants to proceed.

## 2013-11-21 NOTE — Telephone Encounter (Signed)
Tried to return call to patient to inform patient that yes study was completed but no titration study was done. Patient voicemail stated not excepting calls,

## 2013-11-21 NOTE — Telephone Encounter (Signed)
Pt returned call.  States she did complete sleep study, stayed overnight.  States all paperwork/records were to be sent to [place] yesterday.  Pt states she needs to pick up new supplies tomorrow.  Asking for a call to confirm she did complete study and to be sure we received records.

## 2013-11-21 NOTE — Telephone Encounter (Signed)
Patient is wanting [facility] to monitor and service a C_PAP provided by another company not supplies: [facility] monitors their on CPAP machines and supplies but not another companies because  Of responsibilities, plus insurance will not pay according to [facility] without study and titration.

## 2013-11-21 NOTE — Telephone Encounter (Signed)
Talked with [facility] concerning patient the sleep study was never completed by patient will need this before insurance will pay. [facility] can provide at patient cost. FYI

## 2013-11-21 NOTE — Telephone Encounter (Signed)
See if [facility] will supply CPA without the titration study

## 2013-11-22 ENCOUNTER — Other Ambulatory Visit (INDEPENDENT_AMBULATORY_CARE_PROVIDER_SITE_OTHER): Payer: BC Managed Care – PPO

## 2013-11-22 DIAGNOSIS — Z79899 Other long term (current) drug therapy: Secondary | ICD-10-CM

## 2013-11-22 DIAGNOSIS — E785 Hyperlipidemia, unspecified: Secondary | ICD-10-CM

## 2013-11-22 DIAGNOSIS — R748 Abnormal levels of other serum enzymes: Secondary | ICD-10-CM

## 2013-11-22 LAB — COMPREHENSIVE METABOLIC PANEL
ALBUMIN: 3.7 g/dL (ref 3.5–5.2)
ALT: 65 U/L — ABNORMAL HIGH (ref 0–35)
AST: 45 U/L — ABNORMAL HIGH (ref 0–37)
Alkaline Phosphatase: 94 U/L (ref 39–117)
BUN: 13 mg/dL (ref 6–23)
CALCIUM: 9.3 mg/dL (ref 8.4–10.5)
CHLORIDE: 105 meq/L (ref 96–112)
CO2: 26 meq/L (ref 19–32)
Creatinine, Ser: 0.8 mg/dL (ref 0.4–1.2)
GFR: 95.33 mL/min (ref >60.00)
GLUCOSE: 101 mg/dL — AB (ref 70–99)
POTASSIUM: 3.4 meq/L — AB (ref 3.5–5.1)
Sodium: 138 mEq/L (ref 135–145)
Total Bilirubin: 0.6 mg/dL (ref 0.2–1.2)
Total Protein: 6.6 g/dL (ref 6.0–8.3)

## 2013-11-22 LAB — LIPID PANEL
CHOLESTEROL: 172 mg/dL (ref 0–200)
HDL: 42.2 mg/dL (ref >39.00)
LDL CALC: 108 mg/dL — AB (ref 0–99)
NonHDL: 129.8
TRIGLYCERIDES: 107 mg/dL (ref 0.0–149.0)
Total CHOL/HDL Ratio: 4
VLDL: 21.4 mg/dL (ref 0.0–40.0)

## 2013-11-22 LAB — TSH: TSH: 1.67 u[IU]/mL (ref 0.35–4.50)

## 2013-11-23 ENCOUNTER — Telehealth: Payer: Self-pay | Admitting: Internal Medicine

## 2013-11-23 NOTE — Telephone Encounter (Signed)
Patient would like the referral for new sleep study.

## 2013-11-23 NOTE — Telephone Encounter (Signed)
Patient taking OTC chloracetin for sinus and  BP has increased to 167/102 and patient missed 2 days of  BP medication patient ask if she can or should take another Amlodipine 5mg . Advised patient not to take anything until hearing from MD

## 2013-11-23 NOTE — Telephone Encounter (Signed)
Referral is in process as requested 

## 2013-11-23 NOTE — Telephone Encounter (Signed)
Spoke with pt. Discussed that BP elevated likely because she missed her medication, not because of antihistamine. She is taking second dose of Amlodipine tonight. Going to recheck BP in morning. Will be in touch with office Monday if persistent BP elevation.

## 2013-11-25 ENCOUNTER — Encounter: Payer: Self-pay | Admitting: Internal Medicine

## 2013-11-25 DIAGNOSIS — R748 Abnormal levels of other serum enzymes: Secondary | ICD-10-CM | POA: Insufficient documentation

## 2013-11-25 NOTE — Assessment & Plan Note (Signed)
Patient asked to return in one month for additional testing.

## 2013-11-25 NOTE — Addendum Note (Signed)
Addended by: Sherlene Shams on: 11/25/2013 09:38 AM   Modules accepted: Orders

## 2013-11-26 ENCOUNTER — Encounter: Payer: Self-pay | Admitting: Internal Medicine

## 2013-11-26 ENCOUNTER — Telehealth: Payer: Self-pay | Admitting: Internal Medicine

## 2013-11-26 NOTE — Telephone Encounter (Signed)
Patient called about paper work that was filled out and a message sent to her via Earleen Reaper stating that it was ready. Patient stated that the doctors name needs to be changed to Dr. Smitty Cords. Please call when paper is ready/msn

## 2013-11-27 ENCOUNTER — Ambulatory Visit: Payer: Self-pay | Admitting: Internal Medicine

## 2013-11-27 LAB — HM MAMMOGRAPHY: HM MAMMO: NEGATIVE

## 2013-11-27 NOTE — Telephone Encounter (Signed)
Also attached meds and problem list, per pt request in mychart message

## 2013-11-27 NOTE — Telephone Encounter (Signed)
Notes have been attached to letter, pt notified ready for pickup

## 2013-11-27 NOTE — Telephone Encounter (Signed)
Letter has been corrected, pt notified ready for pick up

## 2013-11-27 NOTE — Telephone Encounter (Signed)
Revised letter. Pt notified ready for pickup.

## 2013-11-29 NOTE — Telephone Encounter (Signed)
Asking about CPAP.  Does she need another sleep study?  States she has been waiting for a response to her questions. Has several questions and would like a call.

## 2013-11-30 NOTE — Telephone Encounter (Signed)
Pt called to check status of sleep study.  States she spoke with [facility] this morning and was told that they can use an SD card to get her records out of her machine.  Pt states [facility] but is waiting for the referral to be complete.  States that she was told she does not have to redo the sleep study or the titration if they can use the SD card to get the information from her machine.  States [facility] does not have any information on her regarding being referred, but the pt states this was to have been done weeks ago.  Pt states this needs to be done urgently as she has been waiting and needs to get all of this taken care of before she goes back to work.

## 2013-12-03 ENCOUNTER — Encounter: Payer: Self-pay | Admitting: Internal Medicine

## 2013-12-04 ENCOUNTER — Ambulatory Visit: Payer: Self-pay | Admitting: Specialist

## 2013-12-04 NOTE — Telephone Encounter (Signed)
Faxed DME and office notes along with insurance to [facility] for patient.

## 2013-12-12 ENCOUNTER — Encounter: Payer: Self-pay | Admitting: *Deleted

## 2013-12-24 ENCOUNTER — Ambulatory Visit: Payer: Self-pay | Admitting: Unknown Physician Specialty

## 2013-12-25 ENCOUNTER — Encounter: Payer: Self-pay | Admitting: Internal Medicine

## 2013-12-25 NOTE — Progress Notes (Signed)
Mailed patient a copy of unread MyChart Message for 12/18/13.

## 2013-12-27 LAB — PATHOLOGY REPORT

## 2014-02-11 ENCOUNTER — Encounter: Payer: Self-pay | Admitting: Internal Medicine

## 2014-02-15 ENCOUNTER — Encounter: Payer: Self-pay | Admitting: Internal Medicine

## 2014-05-08 ENCOUNTER — Other Ambulatory Visit: Payer: Self-pay | Admitting: Internal Medicine

## 2014-06-11 ENCOUNTER — Other Ambulatory Visit: Payer: Self-pay | Admitting: Internal Medicine

## 2014-06-17 ENCOUNTER — Telehealth: Payer: Self-pay | Admitting: Internal Medicine

## 2014-06-17 NOTE — Telephone Encounter (Signed)
Pt called to request appt to be seen. Pt needs refill on blood pressure medications. Pt has enough rx for the end of the week. Please advise where to add to schedule/msn

## 2014-06-17 NOTE — Telephone Encounter (Signed)
This was refilled 06/11/14, can schedule next available

## 2014-07-16 ENCOUNTER — Ambulatory Visit (INDEPENDENT_AMBULATORY_CARE_PROVIDER_SITE_OTHER): Payer: BC Managed Care – PPO | Admitting: Internal Medicine

## 2014-07-16 ENCOUNTER — Ambulatory Visit: Payer: BC Managed Care – PPO | Admitting: Internal Medicine

## 2014-07-16 ENCOUNTER — Encounter: Payer: Self-pay | Admitting: Internal Medicine

## 2014-07-16 VITALS — BP 122/74 | HR 72 | Resp 14 | Ht 60.0 in | Wt 194.5 lb

## 2014-07-16 DIAGNOSIS — G4733 Obstructive sleep apnea (adult) (pediatric): Secondary | ICD-10-CM

## 2014-07-16 DIAGNOSIS — E669 Obesity, unspecified: Secondary | ICD-10-CM

## 2014-07-16 DIAGNOSIS — N951 Menopausal and female climacteric states: Secondary | ICD-10-CM

## 2014-07-16 MED ORDER — AMLODIPINE BESYLATE 5 MG PO TABS: 5.0000 mg | ORAL_TABLET | Freq: Every day | ORAL | Status: DC

## 2014-07-16 MED ORDER — METFORMIN HCL ER 500 MG PO TB24: 1000.0000 mg | ORAL_TABLET | Freq: Every day | ORAL | Status: DC

## 2014-07-16 MED ORDER — SPIRONOLACTONE 25 MG PO TABS: 25.0000 mg | ORAL_TABLET | Freq: Every day | ORAL | Status: DC

## 2014-07-16 MED ORDER — METFORMIN HCL ER 500 MG PO TB24: ORAL_TABLET | ORAL | Status: DC

## 2014-07-16 NOTE — Progress Notes (Signed)
Pre visit review using our clinic review tool, if applicable. No additional management support is needed unless otherwise documented below in the visit note. 

## 2014-07-16 NOTE — Progress Notes (Signed)
Patient ID: Penny Butler, female   DOB: 03-Jul-1950, 64 y.o.   MRN: [id number]   / Patient Active Problem List   Diagnosis Date Noted  . Other nonspecific abnormal serum enzyme levels 11/25/2013  . Menopause syndrome 08/11/2013  . S/P TAH-BSO (total abdominal hysterectomy and bilateral salpingo-oophorectomy) 08/11/2013  . OSA (obstructive sleep apnea)   . Obesity (BMI 30-39.9) 06/01/2011  . Other and unspecified hyperlipidemia 06/01/2011  . Hypertension 06/01/2011    Subjective:  CC:   Chief Complaint  Patient presents with  . Medication Refill    HPI:   Penny Butler is a 64 y.o. female who presents for   FOLOW UP ON OBESITY.  Has gained 3 lbs since last visit in May  Considering bariatric surgery but would not be covered by insurance.  Still struggles with a voracious  Appetite once a momth  for 10 days,  Out of every month   Worried about a cyst on kidney found on ultrasound  . Wt Readings from Last 3 Encounters:  07/16/14 194 lb 8 oz (88.225 kg)  11/16/13 191 lb 8 oz (86.864 kg)  08/09/13 187 lb 8 oz (85.049 kg)    She=is tolerating her CPAP brtter with new machine    Past Medical History  Diagnosis Date  . Hypertension   . Obesity   . OSA (obstructive sleep apnea)     now off of CAPAP without symptoms     Past Surgical History  Procedure Laterality Date  . Abdominal hysterectomy    . Bilateral oophorectomy  2007    [place]       The following portions of the patient's history were reviewed and updated as appropriate: Allergies, current medications, and problem list.    Review of Systems:   Patient denies headache, fevers, malaise, unintentional weight loss, skin rash, eye pain, sinus congestion and sinus pain, sore throat, dysphagia,  hemoptysis , cough, dyspnea, wheezing, chest pain, palpitations, orthopnea, edema, abdominal pain, nausea, melena, diarrhea, constipation, flank pain, dysuria, hematuria, urinary  Frequency, nocturia,  numbness, tingling, seizures,  Focal weakness, Loss of consciousness,  Tremor, insomnia, depression, anxiety, and suicidal ideation.     History   Social History  . Marital Status: Married    Spouse Name: N/A    Number of Children: N/A  . Years of Education: N/A   Occupational History  . Not on file.   Social History Main Topics  . Smoking status: Never Smoker   . Smokeless tobacco: Never Used  . Alcohol Use: Yes  . Drug Use: No  . Sexual Activity: Yes   Other Topics Concern  . Not on file   Social History Narrative    Objective:  Filed Vitals:   07/16/14 1601  BP: 122/74  Pulse: 72  Resp: 14     General appearance: alert, cooperative and appears stated age Ears: normal TM's and external ear canals both ears Throat: lips, mucosa, and tongue normal; teeth and gums normal Neck: no adenopathy, no carotid bruit, supple, symmetrical, trachea midline and thyroid not enlarged, symmetric, no tenderness/mass/nodules Back: symmetric, no curvature. ROM normal. No CVA tenderness. Lungs: clear to auscultation bilaterally Heart: regular rate and rhythm, S1, S2 normal, no murmur, click, rub or gallop Abdomen: soft, non-tender; bowel sounds normal; no masses,  no organomegaly Pulses: 2+ and symmetric Skin: Skin color, texture, turgor normal. No rashes or lesions Lymph nodes: Cervical, supraclavicular, and axillary nodes normal.  Assessment and Plan:  Obesity (BMI 30-39.9) I have addressed  BMI and recommended a low glycemic index diet utilizing smaller more frequent meals to increase metabolism.  I have also recommended that patient start exercising with a goal of 30 minutes of aerobic exercise a minimum of 5 days per week.  She is considering bariatric surgery       OSA (obstructive sleep apnea) Diagnosed by sleep study. She is wearing her CPAP every night a minimum of 6 hours per night.    Menopause syndrome Discussed trial of lexapro.  A total of 25    minutes of  face to face time was spent with patient more than half of which was spent in counselling and coordination of care      Updated Medication List Outpatient Encounter Prescriptions as of 07/16/2014  Medication Sig  . amLODipine (NORVASC) 5 MG tablet Take 1 tablet (5 mg total) by mouth daily.  . [DISCONTINUED] amLODipine (NORVASC) 5 MG tablet TAKE 1 TABLET BY MOUTH EVERY DAY  . [DISCONTINUED] metFORMIN (GLUCOPHAGE-XR) 500 MG 24 hr tablet TAKE ONE TABLET BY MOUTH AT BEDTIME.  . [DISCONTINUED] metFORMIN (GLUCOPHAGE-XR) 500 MG 24 hr tablet TAKE ONE TABLET BY MOUTH AT BEDTIME.  . [DISCONTINUED] metFORMIN (GLUCOPHAGE-XR) 500 MG 24 hr tablet Take 2 tablets (1,000 mg total) by mouth daily. TAKE ONE TABLET BY MOUTH AT BEDTIME.  Marland Kitchen spironolactone (ALDACTONE) 25 MG tablet Take 1 tablet (25 mg total) by mouth daily.  . [DISCONTINUED] citalopram (CELEXA) 20 MG tablet TAKE ONE TABLET BY MOUTH EVERY DAY AND TWO TABLETS DAILY DURING MENSTRUAL PERIOD  . [DISCONTINUED] citalopram (CELEXA) 20 MG tablet TAKE ONE TABLET BY MOUTH EVERY DAY AND TWO TABLETS DAILY DURING MENSTRUAL PERIOD  . [DISCONTINUED] Lorcaserin HCl 10 MG TABS Take 1 tablet by mouth daily at 12 noon.  . [DISCONTINUED] Red Yeast Rice Extract ([facility]S RED YEAST RICE) 600 MG CAPS Take 1 capsule (600 mg total) by mouth 2 (two) times daily before a meal.     No orders of the defined types were placed in this encounter.    No Follow-up on file.

## 2014-07-16 NOTE — Patient Instructions (Signed)
You can try Muscle Milk chocolate  It is dairy free.  And may come in strawberry as well.  Ow carb   I also recommend trying the [employer]  Product   You can use spironolactone as needed for bloating, it is a mild diuretic so take it in the morning

## 2014-07-17 ENCOUNTER — Other Ambulatory Visit: Payer: Self-pay | Admitting: *Deleted

## 2014-07-17 ENCOUNTER — Telehealth: Payer: Self-pay | Admitting: Internal Medicine

## 2014-07-17 MED ORDER — METFORMIN HCL ER 500 MG PO TB24: 1000.0000 mg | ORAL_TABLET | Freq: Every day | ORAL | Status: DC

## 2014-07-17 NOTE — Assessment & Plan Note (Signed)
I have addressed  BMI and recommended a low glycemic index diet utilizing smaller more frequent meals to increase metabolism.  I have also recommended that patient start exercising with a goal of 30 minutes of aerobic exercise a minimum of 5 days per week.  She is considering bariatric surgery   

## 2014-07-17 NOTE — Telephone Encounter (Signed)
sent my chart

## 2014-07-17 NOTE — Assessment & Plan Note (Signed)
Diagnosed by sleep study. She is wearing her CPAP every night a minimum of 6 hours per night.  

## 2014-07-17 NOTE — Assessment & Plan Note (Signed)
Discussed trial of lexapro.

## 2014-07-17 NOTE — Telephone Encounter (Signed)
Pt request to find out a GYN that Dr. Darrick Huntsmanullo would recommend.msn

## 2014-07-17 NOTE — Telephone Encounter (Signed)
Penny Butler  At [facility]

## 2014-08-30 ENCOUNTER — Ambulatory Visit: Payer: BC Managed Care – PPO | Admitting: Internal Medicine

## 2014-09-18 ENCOUNTER — Ambulatory Visit: Payer: BC Managed Care – PPO | Admitting: Internal Medicine

## 2014-11-19 ENCOUNTER — Telehealth: Payer: Self-pay | Admitting: *Deleted

## 2014-11-19 NOTE — Telephone Encounter (Signed)
Spoke with pt, advised of MDs message 

## 2014-11-19 NOTE — Telephone Encounter (Signed)
I do not call in  Antibiotics for "sinus infections"  And I do not treat patient =s with antibiotics even if the have an appt unless patient has taken a non antibiotic regimen for 5 days. Tell her to take generic OTC benadryl  In the evening for drainage,  Sudafed PE  10 to 20 mg every 6 hours for the congestion,  and flush her sinuses twice daily with [facility]'s snus rinse

## 2014-11-19 NOTE — Telephone Encounter (Signed)
Pt called states she has a sinus infection, she is requesting a Rx to be called in.  I offered pt an appoint she declined.  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  Please advise

## 2014-12-06 ENCOUNTER — Telehealth: Payer: Self-pay

## 2014-12-06 ENCOUNTER — Other Ambulatory Visit: Payer: Self-pay | Admitting: Internal Medicine

## 2014-12-06 DIAGNOSIS — Z1231 Encounter for screening mammogram for malignant neoplasm of breast: Secondary | ICD-10-CM

## 2014-12-06 MED ORDER — PHENTERMINE HCL 37.5 MG PO TABS: ORAL_TABLET | ORAL | Status: DC

## 2014-12-06 MED ORDER — ESCITALOPRAM OXALATE 10 MG PO TABS: 10.0000 mg | ORAL_TABLET | Freq: Every day | ORAL | Status: DC

## 2014-12-06 MED ORDER — CITALOPRAM HYDROBROMIDE 10 MG PO TABS: 10.0000 mg | ORAL_TABLET | Freq: Every day | ORAL | Status: DC

## 2014-12-06 NOTE — Telephone Encounter (Signed)
Fax form pharmacy needing PA for Phentermine, started online and approved. Pharmacy notified.

## 2014-12-06 NOTE — Telephone Encounter (Signed)
Called [facility] and cancelled Lexapro and will call patient and let her know

## 2014-12-06 NOTE — Telephone Encounter (Signed)
Please call pharmacy and cancel the  Lexapro,  But fill the citalopram and phentermine

## 2014-12-06 NOTE — Telephone Encounter (Signed)
Pt wanted to be put back on Celexa NOT Lexapro, she has never been on it. Please advise. Thank you.

## 2014-12-06 NOTE — Telephone Encounter (Signed)
The patient called and is hoping speak with the nurse regarding her increasing anxiety.  I offered her an appointment with Dr.Tullo for the next 30 min slot (July 6th) but she refused stating she was hoping the nurse might be able to provide advise.  Pt callback - [phone]

## 2014-12-06 NOTE — Telephone Encounter (Signed)
Pt states that she would like to get back on the low carb diet program that she was on 2 years ago with Dr Arlana Pouch. States that she has gotten back up to over 200 pounds and just can't take it anymore. She has been under a lot of stresses with family issues and it is playing havoc with her HBP and pre diabetes. She wanted to get back into see you but you did not have an appointment until July and she is going on vacation next Saturday. She would like get back on Phentermine and Lexapro for the craving and axiety. Please advise.

## 2014-12-06 NOTE — Addendum Note (Signed)
Addended by: Sherlene Shams on: 12/06/2014 02:00 PM   Modules accepted: Orders, Medications

## 2014-12-06 NOTE — Telephone Encounter (Signed)
I will refill both and please give her an appt in July..  Makes sure she starts with 5 mg of lexapro for the first week, then increases to full tablet

## 2014-12-09 ENCOUNTER — Ambulatory Visit
Admission: RE | Admit: 2014-12-09 | Discharge: 2014-12-09 | Disposition: A | Discharge status: Home / Self Care | Payer: BC Managed Care – PPO | Source: Ambulatory Visit | Attending: Internal Medicine | Admitting: Internal Medicine

## 2014-12-09 DIAGNOSIS — Z1231 Encounter for screening mammogram for malignant neoplasm of breast: Secondary | ICD-10-CM | POA: Diagnosis not present

## 2014-12-30 ENCOUNTER — Encounter: Payer: Self-pay | Admitting: Internal Medicine

## 2014-12-30 ENCOUNTER — Ambulatory Visit (INDEPENDENT_AMBULATORY_CARE_PROVIDER_SITE_OTHER): Payer: BC Managed Care – PPO | Admitting: Internal Medicine

## 2014-12-30 VITALS — BP 130/78 | HR 91 | Temp 98.0°F | Resp 14 | Ht 60.0 in | Wt 200.0 lb

## 2014-12-30 DIAGNOSIS — Z1159 Encounter for screening for other viral diseases: Secondary | ICD-10-CM

## 2014-12-30 DIAGNOSIS — F411 Generalized anxiety disorder: Secondary | ICD-10-CM

## 2014-12-30 DIAGNOSIS — E669 Obesity, unspecified: Secondary | ICD-10-CM

## 2014-12-30 DIAGNOSIS — E785 Hyperlipidemia, unspecified: Secondary | ICD-10-CM | POA: Diagnosis not present

## 2014-12-30 DIAGNOSIS — I10 Essential (primary) hypertension: Secondary | ICD-10-CM

## 2014-12-30 DIAGNOSIS — R5383 Other fatigue: Secondary | ICD-10-CM

## 2014-12-30 LAB — LDL CHOLESTEROL, DIRECT: LDL DIRECT: 163 mg/dL

## 2014-12-30 LAB — COMPREHENSIVE METABOLIC PANEL
ALBUMIN: 4.2 g/dL (ref 3.5–5.2)
ALK PHOS: 81 U/L (ref 39–117)
ALT: 18 U/L (ref 0–35)
AST: 17 U/L (ref 0–37)
BILIRUBIN TOTAL: 0.3 mg/dL (ref 0.2–1.2)
BUN: 23 mg/dL (ref 6–23)
CALCIUM: 10.1 mg/dL (ref 8.4–10.5)
CHLORIDE: 106 meq/L (ref 96–112)
CO2: 23 meq/L (ref 19–32)
CREATININE: 1.03 mg/dL (ref 0.40–1.20)
GFR: 69.93 mL/min (ref >60.00)
Glucose, Bld: 93 mg/dL (ref 70–99)
POTASSIUM: 4.4 meq/L (ref 3.5–5.1)
Sodium: 140 mEq/L (ref 135–145)
Total Protein: 6.9 g/dL (ref 6.0–8.3)

## 2014-12-30 LAB — CBC WITH DIFFERENTIAL/PLATELET
Basophils Absolute: 0 10*3/uL (ref 0.0–0.1)
Basophils Relative: 0.5 % (ref 0.0–3.0)
EOS ABS: 0.1 10*3/uL (ref 0.0–0.7)
EOS PCT: 1.8 % (ref 0.0–5.0)
HEMATOCRIT: 41.1 % (ref 36.0–46.0)
Hemoglobin: 13.8 g/dL (ref 12.0–15.0)
LYMPHS PCT: 31.6 % (ref 12.0–46.0)
Lymphs Abs: 2.6 10*3/uL (ref 0.7–4.0)
MCHC: 33.7 g/dL (ref 30.0–36.0)
MCV: 84.2 fl (ref 78.0–100.0)
Monocytes Absolute: 0.7 10*3/uL (ref 0.1–1.0)
Monocytes Relative: 8.7 % (ref 3.0–12.0)
Neutro Abs: 4.6 10*3/uL (ref 1.4–7.7)
Neutrophils Relative %: 57.4 % (ref 43.0–77.0)
Platelets: 283 10*3/uL (ref 150.0–400.0)
RBC: 4.88 Mil/uL (ref 3.87–5.11)
RDW: 13.5 % (ref 11.5–15.5)
WBC: 8.1 10*3/uL (ref 4.0–10.5)

## 2014-12-30 LAB — TSH: TSH: 1.66 u[IU]/mL (ref 0.35–4.50)

## 2014-12-30 MED ORDER — PHENTERMINE HCL 37.5 MG PO TABS: ORAL_TABLET | ORAL | Status: DC

## 2014-12-30 NOTE — Patient Instructions (Signed)
.  I have authorized the use of phentermine for 3 months.  Please  return to see me in 3 months  r take the whole tablet in the morning,  Goal weight loss is  5% of your starting weight by the end of the  3 months   For you ,  Based on today's reading,  Your goal Is 10  lbs

## 2014-12-30 NOTE — Progress Notes (Signed)
Pre visit review using our clinic review tool, if applicable. No additional management support is needed unless otherwise documented below in the visit note. 

## 2014-12-30 NOTE — Progress Notes (Signed)
Subjective:  Patient ID: Penny Butler, female    DOB: 1951-03-29  Age: 64 y.o. MRN: [id number]  CC: The primary encounter diagnosis was Obesity. Diagnoses of Essential hypertension, Hyperlipidemia, Need for hepatitis C screening test, Other fatigue, Obesity (BMI 30-39.9), and GAD (generalized anxiety disorder) were also pertinent to this visit.  HPI Penny Butler presents for follow up on  Hypertension, GAD and obesity.  She resumed citalopram and phentermine one month ago.  Her mood has greatly improved ,  She is noting an im[rovement in her constant worrying ,  And sleeping better.  She has been folllowing a carbohydrate restricted diet, even while on vacatio last week.  But has not started exercising yet.   Mammgram and colonoscopy are  up to date.   Outpatient Prescriptions Prior to Visit  Medication Sig Dispense Refill  . amLODipine (NORVASC) 5 MG tablet Take 1 tablet (5 mg total) by mouth daily. 90 tablet 3  . citalopram (CELEXA) 10 MG tablet Take 1 tablet (10 mg total) by mouth daily. 30 tablet 1  . metFORMIN (GLUCOPHAGE-XR) 500 MG 24 hr tablet Take 2 tablets (1,000 mg total) by mouth daily. 180 tablet 1  . spironolactone (ALDACTONE) 25 MG tablet Take 1 tablet (25 mg total) by mouth daily. 30 tablet 1  . phentermine (ADIPEX-P) 37.5 MG tablet 1/2 tablet in the am and early afternoon 30 tablet o   No facility-administered medications prior to visit.    Review of Systems;  Patient denies headache, fevers, malaise, unintentional weight loss, skin rash, eye pain, sinus congestion and sinus pain, sore throat, dysphagia,  hemoptysis , cough, dyspnea, wheezing, chest pain, palpitations, orthopnea, edema, abdominal pain, nausea, melena, diarrhea, constipation, flank pain, dysuria, hematuria, urinary  Frequency, nocturia, numbness, tingling, seizures,  Focal weakness, Loss of consciousness,  Tremor, insomnia, depression, anxiety, and suicidal ideation.      Objective:  BP 130/78  mmHg  Pulse 91  Temp(Src) 98 F (36.7 C) (Oral)  Resp 14  Ht 5' (1.524 m)  Wt 200 lb (90.719 kg)  BMI 39.06 kg/m2  SpO2 97%  BP Readings from Last 3 Encounters:  12/30/14 130/78  07/16/14 122/74  11/16/13 148/80    Wt Readings from Last 3 Encounters:  12/30/14 200 lb (90.719 kg)  07/16/14 194 lb 8 oz (88.225 kg)  11/16/13 191 lb 8 oz (86.864 kg)    General appearance: alert, cooperative and appears stated age Ears: normal TM's and external ear canals both ears Throat: lips, mucosa, and tongue normal; teeth and gums normal Neck: no adenopathy, no carotid bruit, supple, symmetrical, trachea midline and thyroid not enlarged, symmetric, no tenderness/mass/nodules Back: symmetric, no curvature. ROM normal. No CVA tenderness. Lungs: clear to auscultation bilaterally Heart: regular rate and rhythm, S1, S2 normal, no murmur, click, rub or gallop Abdomen: soft, non-tender; bowel sounds normal; no masses,  no organomegaly Pulses: 2+ and symmetric Skin: Skin color, texture, turgor normal. No rashes or lesions Lymph nodes: Cervical, supraclavicular, and axillary nodes normal.  Lab Results  Component Value Date   HGBA1C 5.7 08/09/2013   HGBA1C 5.4 06/10/2011    Lab Results  Component Value Date   CREATININE 1.03 12/30/2014   CREATININE 0.8 11/22/2013   CREATININE 0.9 08/09/2013    Lab Results  Component Value Date   WBC 8.1 12/30/2014   HGB 13.8 12/30/2014   HCT 41.1 12/30/2014   PLT 283.0 12/30/2014   GLUCOSE 93 12/30/2014   CHOL 172 11/22/2013   TRIG 107.0 11/22/2013  HDL 42.20 11/22/2013   LDLDIRECT 163.0 12/30/2014   LDLCALC 108* 11/22/2013   ALT 18 12/30/2014   AST 17 12/30/2014   NA 140 12/30/2014   K 4.4 12/30/2014   CL 106 12/30/2014   CREATININE 1.03 12/30/2014   BUN 23 12/30/2014   CO2 23 12/30/2014   TSH 1.66 12/30/2014   HGBA1C 5.7 08/09/2013   MICROALBUR 1.2 08/09/2013    Mm Digital Screening Bilateral  12/09/2014   CLINICAL DATA:  Screening.   EXAM: DIGITAL SCREENING BILATERAL MAMMOGRAM WITH CAD  COMPARISON:  Previous exam(s).  ACR Breast Density Category b: There are scattered areas of fibroglandular density.  FINDINGS: There are no findings suspicious for malignancy. Images were processed with CAD.  IMPRESSION: No mammographic evidence of malignancy. A result letter of this screening mammogram will be mailed directly to the patient.  RECOMMENDATION: Screening mammogram in one year. (Code:[id number]-B-01Y)  BI-RADS CATEGORY  1: Negative.   Electronically Signed   By: Harmon Pier M.D.   On: 12/09/2014 12:37    Assessment & Plan:   Problem List Items Addressed This Visit      Unprioritized   Obesity (BMI 30-39.9)    She has had difficulty losing weight due to increased appetite and lack of regular exercise and is requesting a refill  Phentermine.  She understands that  the medication will be discontinued if she has not lost 5% of her body weight over the next 3 months, which , based on today's weight is 10 lbs.  Wt Readings from Last 3 Encounters:  12/30/14 200 lb (90.719 kg)  07/16/14 194 lb 8 oz (88.225 kg)  11/16/13 191 lb 8 oz (86.864 kg)        Relevant Medications   phentermine (ADIPEX-P) 37.5 MG tablet   Hypertension    Well controlled on current regimen. Renal function stable, no changes today.  Lab Results  Component Value Date   CREATININE 1.03 12/30/2014   Lab Results  Component Value Date   NA 140 12/30/2014   K 4.4 12/30/2014   CL 106 12/30/2014   CO2 23 12/30/2014         Relevant Orders   Comprehensive metabolic panel (Completed)   GAD (generalized anxiety disorder)    Improved with initiation of citalopram      Hyperlipidemia   Relevant Orders   LDL cholesterol, direct (Completed)   TSH (Completed)    Other Visit Diagnoses    Obesity    -  Primary    Relevant Medications    phentermine (ADIPEX-P) 37.5 MG tablet    Other Relevant Orders    TSH (Completed)    Need for hepatitis C screening test         Relevant Orders    Hepatitis C antibody (Completed)    Other fatigue        Relevant Orders    CBC with Differential/Platelet (Completed)       I am having Penny Butler maintain her amLODipine, spironolactone, metFORMIN, citalopram, and phentermine.  Meds ordered this encounter  Medications  . phentermine (ADIPEX-P) 37.5 MG tablet    Sig: 1/2 tablet in the am and early afternoon    Dispense:  30 tablet    Refill:  2    Medications Discontinued During This Encounter  Medication Reason  . phentermine (ADIPEX-P) 37.5 MG tablet Reorder    Follow-up: No Follow-up on file.   Sherlene Shams, MD

## 2014-12-31 DIAGNOSIS — F411 Generalized anxiety disorder: Secondary | ICD-10-CM | POA: Insufficient documentation

## 2014-12-31 LAB — HEPATITIS C ANTIBODY: HCV Ab: NEGATIVE

## 2014-12-31 NOTE — Assessment & Plan Note (Signed)
She has had difficulty losing weight due to increased appetite and lack of regular exercise and is requesting a refill  Phentermine.  She understands that  the medication will be discontinued if she has not lost 5% of her body weight over the next 3 months, which , based on today's weight is 10 lbs.  Wt Readings from Last 3 Encounters:  12/30/14 200 lb (90.719 kg)  07/16/14 194 lb 8 oz (88.225 kg)  11/16/13 191 lb 8 oz (86.864 kg)

## 2014-12-31 NOTE — Assessment & Plan Note (Signed)
Improved with initiation of citalopram

## 2014-12-31 NOTE — Assessment & Plan Note (Signed)
Well controlled on current regimen. Renal function stable, no changes today.  Lab Results  Component Value Date   CREATININE 1.03 12/30/2014   Lab Results  Component Value Date   NA 140 12/30/2014   K 4.4 12/30/2014   CL 106 12/30/2014   CO2 23 12/30/2014

## 2015-01-02 ENCOUNTER — Encounter: Payer: Self-pay | Admitting: Internal Medicine

## 2015-01-17 NOTE — Telephone Encounter (Signed)
Mailed patient the [employer].

## 2015-02-08 ENCOUNTER — Other Ambulatory Visit: Payer: Self-pay | Admitting: Internal Medicine

## 2015-03-04 ENCOUNTER — Other Ambulatory Visit: Payer: Self-pay | Admitting: *Deleted

## 2015-03-04 MED ORDER — CITALOPRAM HYDROBROMIDE 10 MG PO TABS: ORAL_TABLET | ORAL | Status: DC

## 2015-04-10 ENCOUNTER — Telehealth: Payer: Self-pay

## 2015-04-10 NOTE — Telephone Encounter (Signed)
PA for phentermine completed, Spoke with Raynelle [facility]Julie at [facility], approved for 03/11/15-04/09/16 case Number [id number]

## 2015-06-19 ENCOUNTER — Telehealth: Payer: Self-pay | Admitting: *Deleted

## 2015-06-19 DIAGNOSIS — G4733 Obstructive sleep apnea (adult) (pediatric): Secondary | ICD-10-CM

## 2015-06-19 NOTE — Telephone Encounter (Signed)
Last OV with PCP 12/30/14 Please advise

## 2015-06-19 NOTE — Telephone Encounter (Signed)
Patient stated that she uses a Cpap machine and at this pont the machine has started to leak air from the hose, and she's not resting well. Patient has requested a script from Dr. Darrick [facility]ullo to [facility] to [facility] supplies for her Cpap. The script can be faxed to [phone]. Please call patient if script when script is sent over.

## 2015-06-20 NOTE — Telephone Encounter (Signed)
Notified patient of message per Dr. Melina Schoolsullo's note.  Script faxed.

## 2015-06-20 NOTE — Telephone Encounter (Signed)
dme order written , printed  .  But she may requird a different mask if the leak is not fixed by replacing the hoses , if she has lost weight her mask may not be fitting well.

## 2015-07-25 ENCOUNTER — Ambulatory Visit (INDEPENDENT_AMBULATORY_CARE_PROVIDER_SITE_OTHER): Payer: BC Managed Care – PPO | Admitting: Family Medicine

## 2015-07-25 ENCOUNTER — Encounter: Payer: Self-pay | Admitting: Family Medicine

## 2015-07-25 ENCOUNTER — Telehealth: Payer: Self-pay | Admitting: Internal Medicine

## 2015-07-25 VITALS — BP 140/92 | HR 86 | Temp 98.5°F | Ht 60.0 in | Wt 190.0 lb

## 2015-07-25 DIAGNOSIS — R05 Cough: Secondary | ICD-10-CM | POA: Diagnosis not present

## 2015-07-25 DIAGNOSIS — R059 Cough, unspecified: Secondary | ICD-10-CM

## 2015-07-25 MED ORDER — PREDNISONE 50 MG PO TABS: ORAL_TABLET | ORAL | Status: DC

## 2015-07-25 MED ORDER — BENZONATATE 100 MG PO CAPS
100.0000 mg | ORAL_CAPSULE | Freq: Three times a day (TID) | ORAL | Status: DC | PRN
Start: 2015-07-25 — End: 2015-10-21

## 2015-07-25 MED ORDER — HYDROCOD POLST-CPM POLST ER 10-8 MG/5ML PO SUER: 5.0000 mL | Freq: Two times a day (BID) | ORAL | Status: DC | PRN

## 2015-07-25 NOTE — Assessment & Plan Note (Signed)
New problem. Exam reassuring. Likely viral in origin. Treating with tessalon and tussionex. Rx for prednisone given if fails to improve or worsens.

## 2015-07-25 NOTE — Telephone Encounter (Signed)
Pt going to see Dr. Adriana Simas today at 11:30am

## 2015-07-25 NOTE — Progress Notes (Signed)
Pre visit review using our clinic review tool, if applicable. No additional management support is needed unless otherwise documented below in the visit note. 

## 2015-07-25 NOTE — Patient Instructions (Signed)
Use the Tessalon during the day for cough.  Use the tussionex at night.  If this lingers, take the prednisone as prescribed.  Follow up as needed.  Take care  Dr. Adriana Simas

## 2015-07-25 NOTE — Progress Notes (Signed)
Subjective:  Patient ID: Penny Butler, female    DOB: 1951-01-24  Age: 65 y.o. MRN: [id number]  CC: Cough, congestion  HPI:  65 year old female presents to clinic today for an acute visit with the above complaints.  Patient states that she has had postnasal drip and cough since Sunday. She states that she feels like it settling in her chest. Cough is nonproductive and intermittent. No associated fevers or chills. She does report some associated chest tightness. No exacerbating or relieving factors.  Social Hx   Social History   Social History  . Marital Status: Married    Spouse Name: N/A  . Number of Children: N/A  . Years of Education: N/A   Social History Main Topics  . Smoking status: Never Smoker   . Smokeless tobacco: Never Used  . Alcohol Use: Yes  . Drug Use: No  . Sexual Activity: Yes   Other Topics Concern  . None   Social History Narrative   Review of Systems  Constitutional: Negative.   HENT: Positive for postnasal drip.   Respiratory: Positive for cough and chest tightness.    Objective:  BP 140/92 mmHg  Pulse 86  Temp(Src) 98.5 F (36.9 C) (Oral)  Ht 5' (1.524 m)  Wt 190 lb (86.183 kg)  BMI 37.11 kg/m2  SpO2 98%  BP/Weight 07/25/2015 12/30/2014 07/16/2014  Systolic BP 140 130 122  Diastolic BP 92 78 74  Wt. (Lbs) 190 200 194.5  BMI 37.11 39.06 37.99   Physical Exam  Constitutional: She is oriented to person, place, and time. She appears well-developed. No distress.  HENT:  Head: Normocephalic and atraumatic.  Mouth/Throat: Oropharynx is clear and moist.  Normal TM's bilaterally.  Cardiovascular: Normal rate and regular rhythm.   No murmur heard. Pulmonary/Chest: Effort normal. No respiratory distress. She has no wheezes. She has no rales.  Neurological: She is alert and oriented to person, place, and time.  Psychiatric: She has a normal mood and affect.  Vitals reviewed.  Lab Results  Component Value Date   WBC 8.1 12/30/2014   HGB  13.8 12/30/2014   HCT 41.1 12/30/2014   PLT 283.0 12/30/2014   GLUCOSE 93 12/30/2014   CHOL 172 11/22/2013   TRIG 107.0 11/22/2013   HDL 42.20 11/22/2013   LDLDIRECT 163.0 12/30/2014   LDLCALC 108* 11/22/2013   ALT 18 12/30/2014   AST 17 12/30/2014   NA 140 12/30/2014   K 4.4 12/30/2014   CL 106 12/30/2014   CREATININE 1.03 12/30/2014   BUN 23 12/30/2014   CO2 23 12/30/2014   TSH 1.66 12/30/2014   HGBA1C 5.7 08/09/2013   MICROALBUR 1.2 08/09/2013   Assessment & Plan:   Problem List Items Addressed This Visit    Cough - Primary    New problem. Exam reassuring. Likely viral in origin. Treating with tessalon and tussionex. Rx for prednisone given if fails to improve or worsens.         Meds ordered this encounter  Medications  . benzonatate (TESSALON) 100 MG capsule    Sig: Take 1 capsule (100 mg total) by mouth 3 (three) times daily as needed.    Dispense:  30 capsule    Refill:  0  . chlorpheniramine-HYDROcodone (TUSSIONEX PENNKINETIC ER) 10-8 MG/5ML SUER    Sig: Take 5 mLs by mouth every 12 (twelve) hours as needed.    Dispense:  115 mL    Refill:  0  . predniSONE (DELTASONE) 50 MG tablet  Sig: 1 tablet daily x 5 days.    Dispense:  5 tablet    Refill:  0   Follow-up: PRN  Everlene Other DO [facility]

## 2015-07-25 NOTE — Telephone Encounter (Signed)
Pt called about Coughing deep down congestion pain in chest from coughing/Possible Bronchitis. Pt does not want to see any other provider. No appt avail to sch. Let me know where to sch. Call pt @ [phone].

## 2015-10-09 ENCOUNTER — Other Ambulatory Visit: Payer: Self-pay | Admitting: Internal Medicine

## 2015-10-15 ENCOUNTER — Ambulatory Visit: Payer: BC Managed Care – PPO | Admitting: Nurse Practitioner

## 2015-10-21 ENCOUNTER — Encounter: Payer: Self-pay | Admitting: Internal Medicine

## 2015-10-21 ENCOUNTER — Ambulatory Visit (INDEPENDENT_AMBULATORY_CARE_PROVIDER_SITE_OTHER): Payer: BC Managed Care – PPO | Admitting: Internal Medicine

## 2015-10-21 VITALS — BP 140/90 | HR 87 | Temp 98.4°F | Resp 12 | Ht 60.0 in | Wt 195.8 lb

## 2015-10-21 DIAGNOSIS — G4733 Obstructive sleep apnea (adult) (pediatric): Secondary | ICD-10-CM

## 2015-10-21 DIAGNOSIS — E669 Obesity, unspecified: Secondary | ICD-10-CM

## 2015-10-21 DIAGNOSIS — E785 Hyperlipidemia, unspecified: Secondary | ICD-10-CM

## 2015-10-21 DIAGNOSIS — I10 Essential (primary) hypertension: Secondary | ICD-10-CM | POA: Diagnosis not present

## 2015-10-21 DIAGNOSIS — N63 Unspecified lump in breast: Secondary | ICD-10-CM

## 2015-10-21 DIAGNOSIS — N6320 Unspecified lump in the left breast, unspecified quadrant: Secondary | ICD-10-CM

## 2015-10-21 DIAGNOSIS — Z803 Family history of malignant neoplasm of breast: Secondary | ICD-10-CM | POA: Diagnosis not present

## 2015-10-21 DIAGNOSIS — R7301 Impaired fasting glucose: Secondary | ICD-10-CM

## 2015-10-21 MED ORDER — AMLODIPINE BESYLATE 10 MG PO TABS: 10.0000 mg | ORAL_TABLET | Freq: Every day | ORAL | Status: DC

## 2015-10-21 MED ORDER — NALTREXONE-BUPROPION HCL ER 8-90 MG PO TB12: ORAL_TABLET | ORAL | Status: DC

## 2015-10-21 NOTE — Progress Notes (Signed)
Pre-visit discussion using our clinic review tool. No additional management support is needed unless otherwise documented below in the visit note.  

## 2015-10-21 NOTE — Assessment & Plan Note (Addendum)
Not well controlled on current regimen of amlodipine 5 mg  ,  Will increase to 10 mg . Lab Results  Component Value Date   CREATININE 1.03 12/30/2014   Lab Results  Component Value Date   NA 140 12/30/2014   K 4.4 12/30/2014   CL 106 12/30/2014   CO2 23 12/30/2014

## 2015-10-21 NOTE — Patient Instructions (Addendum)
We need to get your blood pressure under better control before we start contrave.  Please increase the amlodipine to 10 mg daily and return for fasting labs and BP check in one week .    Bupropion; Naltrexone extended-release tablets What is this medicine? BUPROPION; NALTREXONE (byoo PROE pee on; nal TREX one) is a combination product used to promote and maintain weight loss in obese adults or overweight adults who also have weight related medical problems. This medicine should be used with a reduced calorie diet and increased physical activity. This medicine may be used for other purposes; ask your health care provider or pharmacist if you have questions. What should I tell my health care provider before I take this medicine? They need to know if you have any of these conditions: -an eating disorder, such as anorexia or bulimia -diabetes -glaucoma -head injury -heart disease -high blood pressure -history of a drug or alcohol abuse problem -history of a tumor or infection of your brain or spine -history of stroke -history of irregular heartbeat -kidney disease -liver disease -mental illness such as bipolar disorder or psychosis -seizures -suicidal thoughts, plans, or attempt; a previous suicide attempt by you or a family member -an unusual or allergic reaction to bupropion, naltrexone, other medicines, foods, dyes, or preservatives breast-feeding -pregnant or trying to become pregnant How should I use this medicine? Take this medicine by mouth with a glass of water. Follow the directions on the prescription label. Take this medicine in the morning and in the evenings as directed by your healthcare professional. Bonita Quin can take it with or without food. Do not take with high-fat meals as this may increase your risk of seizures. Do not crush, chew, or cut these tablets. Do not take your medicine more often than directed. Do not stop taking this medicine suddenly except upon the advice of your  doctor. A special MedGuide will be given to you by the pharmacist with each prescription and refill. Be sure to read this information carefully each time. Talk to your pediatrician regarding the use of this medicine in children. Special care may be needed. Overdosage: If you think you have taken too much of this medicine contact a poison control center or emergency room at once. NOTE: This medicine is only for you. Do not share this medicine with others. What if I miss a dose? If you miss a dose, skip the missed dose and take your next tablet at the regular time. Do not take double or extra doses. What may interact with this medicine? Do not take this medicine with any of the following medications: -any prescription or street opioid drug like codiene, heroin, methadone -linezolid -MAOIs like Carbex, Eldepryl, Marplan, Nardil, and Parnate -methylene blue (injected into a vein) -other medicines that contain bupropion like Zyban or Wellbutrin This medicine may also interact with the following medications: -alcohol -certain medicines for anxiety or sleep -certain medicines for blood pressure like metoprolol, propranolol -certain medicines for depression or psychotic disturbances -certain medicines for HIV or AIDS like efavirenz, lopinavir, nelfinavir, ritonavir -certain medicines for irregular heart beat like propafenone, flecainide -certain medicines for Parkinson's disease like amantadine, levodopa -certain medicines for seizures like carbamazepine, phenytoin, phenobarbital -cimetidine -clopidogrel -cyclophosphamide -disulfiram -furazolidone -isoniazid -nicotine -orphenadrine -procarbazine -steroid medicines like prednisone or cortisone -stimulant medicines for attention disorders, weight loss, or to stay awake -tamoxifen -theophylline -thioridazine -thiotepa -ticlopidine -tramadol -warfarin This list may not describe all possible interactions. Give your health care provider a  list of all the  medicines, herbs, non-prescription drugs, or dietary supplements you use. Also tell them if you smoke, drink alcohol, or use illegal drugs. Some items may interact with your medicine. What should I watch for while using this medicine? This medicine is intended to be used in addition to a healthy diet and appropriate exercise. The best results are achieved this way. Do not increase or in any way change your dose without consulting your doctor or health care professional. Do not take this medicine with other prescription or over-the-counter weight loss products without consulting your doctor or health care professional. Your doctor should tell you to stop taking this medicine if you do not lose a certain amount of weight within the first 12 weeks of treatment. Visit your doctor or health care professional for regular checkups. Your doctor may order blood tests or other tests to see how you are doing. This medicine may affect blood sugar levels. If you have diabetes, check with your doctor or health care professional before you change your diet or the dose of your diabetic medicine. Patients and their families should watch out for new or worsening depression or thoughts of suicide. Also watch out for sudden changes in feelings such as feeling anxious, agitated, panicky, irritable, hostile, aggressive, impulsive, severely restless, overly excited and hyperactive, or not being able to sleep. If this happens, especially at the beginning of treatment or after a change in dose, call your health care professional. Avoid alcoholic drinks while taking this medicine. Drinking large amounts of alcoholic beverages, using sleeping or anxiety medicines, or quickly stopping the use of these agents while taking this medicine may increase your risk for a seizure. What side effects may I notice from receiving this medicine? Side effects that you should report to your doctor or health care professional as soon as  possible: -allergic reactions like skin rash, itching or hives, swelling of the face, lips, or tongue -breathing problems -changes in vision, hearing -chest pain -confusion -dark urine -depressed mood -fast or irregular heart beat -fever -hallucination, loss of contact with reality -increased blood pressure -light-colored stools -redness, blistering, peeling or loosening of the skin, including inside the mouth -right upper belly pain -seizures -suicidal thoughts or other mood changes -unusually weak or tired -vomiting -yellowing of the eyes or skin Side effects that usually do not require medical attention (Report these to your doctor or health care professional if they continue or are bothersome.): -constipation -diarrhea -dizziness -dry mouth -headache -nausea -trouble sleeping This list may not describe all possible side effects. Call your doctor for medical advice about side effects. You may report side effects to FDA at 1-800-FDA-1088. Where should I keep my medicine? Keep out of the reach of children. Store at room temperature between 15 and 30 degrees C (59 and 86 degrees F). Throw away any unused medicine after the expiration date. NOTE: This sheet is a summary. It may not cover all possible information. If you have questions about this medicine, talk to your doctor, pharmacist, or health care provider.    2016, [employer]vier/[employer] Standard. (2013-03-14 15:17:29)

## 2015-10-21 NOTE — Progress Notes (Signed)
Subjective:  Patient ID: Penny Butler, female    DOB: 03-24-1951  Age: 65 y.o. MRN: [id number]  CC: The primary encounter diagnosis was Breast mass, left. Diagnoses of Family history of breast cancer in first degree relative, Obesity, Essential hypertension, Impaired fasting glucose, Obesity (BMI 30-39.9), OSA (obstructive sleep apnea), and Hyperlipidemia were also pertinent to this visit.  HPI Penny Butler presents for follow up on multiple issues:   1) she noted a tender nodule in left axilla last week .  Sister was diagnosed 2 weeks ago with stage 2 Breast ca at age of 53 . Patient is up to date on mammograms and has not had any recent infections    2) Obesity   Has stopped taking phentermine because it failed to achieve sustainable weight loss.  She is frustrated by her inability to control her appetite and maintain a consistent regimen with regard to diet.  She blames her difficult n being an "estrogen overproducer." and has been reading about bioidentical hormone and naturopathic therapies and has been using a progesterone cream that she purchased OTC from [facility].  She is also taking a product that is supposed to be a "liver cleanse "   She has had a net weight gain of 20 lbs over the last 5 years.   Treated for viral URI recently.      Outpatient Prescriptions Prior to Visit  Medication Sig Dispense Refill  . metFORMIN (GLUCOPHAGE-XR) 500 MG 24 hr tablet Take 2 tablets (1,000 mg total) by mouth daily. 180 tablet 1  . amLODipine (NORVASC) 5 MG tablet TAKE 1 TABLET BY MOUTH EVERY DAY 90 tablet 0  . phentermine (ADIPEX-P) 37.5 MG tablet 1/2 tablet in the am and early afternoon (Patient not taking: Reported on 07/25/2015) 30 tablet 2  . spironolactone (ALDACTONE) 25 MG tablet Take 1 tablet (25 mg total) by mouth daily. (Patient not taking: Reported on 10/21/2015) 30 tablet 1  . benzonatate (TESSALON) 100 MG capsule Take 1 capsule (100 mg total) by mouth 3 (three) times daily  as needed. 30 capsule 0  . chlorpheniramine-HYDROcodone (TUSSIONEX PENNKINETIC ER) 10-8 MG/5ML SUER Take 5 mLs by mouth every 12 (twelve) hours as needed. 115 mL 0  . citalopram (CELEXA) 10 MG tablet TAKE 1 TABLET(10 MG) BY MOUTH DAILY (Patient not taking: Reported on 10/21/2015) 30 tablet 6  . predniSONE (DELTASONE) 50 MG tablet 1 tablet daily x 5 days. 5 tablet 0   No facility-administered medications prior to visit.    Review of Systems;  Patient denies headache, fevers, malaise, unintentional weight loss, skin rash, eye pain, sinus congestion and sinus pain, sore throat, dysphagia,  hemoptysis , cough, dyspnea, wheezing, chest pain, palpitations, orthopnea, edema, abdominal pain, nausea, melena, diarrhea, constipation, flank pain, dysuria, hematuria, urinary  Frequency, nocturia, numbness, tingling, seizures,  Focal weakness, Loss of consciousness,  Tremor, insomnia, depression, anxiety, and suicidal ideation.      Objective:  BP 140/90 mmHg  Pulse 87  Temp(Src) 98.4 F (36.9 C) (Oral)  Resp 12  Ht 5' (1.524 m)  Wt 195 lb 12 oz (88.792 kg)  BMI 38.23 kg/m2  SpO2 97%  BP Readings from Last 3 Encounters:  10/21/15 140/90  07/25/15 140/92  12/30/14 130/78    Wt Readings from Last 3 Encounters:  10/21/15 195 lb 12 oz (88.792 kg)  07/25/15 190 lb (86.183 kg)  12/30/14 200 lb (90.719 kg)    General appearance: alert, cooperative and appears stated age Breast exam :  right breast normal,  Bb sized  nodule left breast 1:00 position  Under the pec muscle  Lungs: clear to auscultation bilaterally Heart: regular rate and rhythm, S1, S2 normal, no murmur, click, rub or gallop Abdomen: soft, non-tender; bowel sounds normal; no masses,  no organomegaly Pulses: 2+ and symmetric Skin: Skin color, texture, turgor normal. No rashes or lesions Lymph nodes: Cervical, supraclavicular, and axillary nodes normal.  Lab Results  Component Value Date   HGBA1C 5.7 08/09/2013   HGBA1C 5.4  06/10/2011    Lab Results  Component Value Date   CREATININE 1.03 12/30/2014   CREATININE 0.8 11/22/2013   CREATININE 0.9 08/09/2013    Lab Results  Component Value Date   WBC 8.1 12/30/2014   HGB 13.8 12/30/2014   HCT 41.1 12/30/2014   PLT 283.0 12/30/2014   GLUCOSE 93 12/30/2014   CHOL 172 11/22/2013   TRIG 107.0 11/22/2013   HDL 42.20 11/22/2013   LDLDIRECT 163.0 12/30/2014   LDLCALC 108* 11/22/2013   ALT 18 12/30/2014   AST 17 12/30/2014   NA 140 12/30/2014   K 4.4 12/30/2014   CL 106 12/30/2014   CREATININE 1.03 12/30/2014   BUN 23 12/30/2014   CO2 23 12/30/2014   TSH 1.66 12/30/2014   HGBA1C 5.7 08/09/2013   MICROALBUR 1.2 08/09/2013    Mm Digital Screening Bilateral  12/09/2014  CLINICAL DATA:  Screening. EXAM: DIGITAL SCREENING BILATERAL MAMMOGRAM WITH CAD COMPARISON:  Previous exam(s). ACR Breast Density Category b: There are scattered areas of fibroglandular density. FINDINGS: There are no findings suspicious for malignancy. Images were processed with CAD. IMPRESSION: No mammographic evidence of malignancy. A result letter of this screening mammogram will be mailed directly to the patient. RECOMMENDATION: Screening mammogram in one year. (Code:[id number]-B-01Y) BI-RADS CATEGORY  1: Negative. Electronically Signed   By: Harmon PierJeffrey  Hu M.D.   On: 12/09/2014 12:37    Assessment & Plan:   Problem List Items Addressed This Visit    Obesity (BMI 30-39.9)    A total of 25 minutes of face to face time was spent with patient more than half of which was spent in counselling about her obesity and her efforts thus far to lose weight.  Advised her to consider a trial of Contrave . MOA discussed .  She will need to get her BP under control before starting the medication.  She will return for fasting labs ASAP       Relevant Medications   Naltrexone-Bupropion HCl ER 8-90 MG TB12   Hyperlipidemia    Treated in the past with  red yeast rice  twice daily,  Nonne in several months, She   will return for fasting lipids,  Untreated LDL was 190. Her 10 yr risk of CAD is 15% .   Lab Results  Component Value Date   CHOL 172 11/22/2013   HDL 42.20 11/22/2013   LDLCALC 108* 11/22/2013   LDLDIRECT 163.0 12/30/2014   TRIG 107.0 11/22/2013   CHOLHDL 4 11/22/2013          Relevant Medications   amLODipine (NORVASC) 10 MG tablet   Hypertension    Not well controlled on current regimen of amlodipine 5 mg  ,  Will increase to 10 mg . Lab Results  Component Value Date   CREATININE 1.03 12/30/2014   Lab Results  Component Value Date   NA 140 12/30/2014   K 4.4 12/30/2014   CL 106 12/30/2014   CO2 23 12/30/2014  Relevant Medications   amLODipine (NORVASC) 10 MG tablet   Other Relevant Orders   Microalbumin / creatinine urine ratio   Lipid panel   LDL cholesterol, direct   OSA (obstructive sleep apnea)    Diagnosed by sleep study. She is wearing her CPAP every night a minimum of 6 hours per night and notes improved wakefulness         Breast mass, left - Primary    Diagnostic mammogram of left breast ordered.       Relevant Orders   MM Digital Diagnostic Unilat L   US BREAST COMPLETE UNI LEFT INC AXILLA    Other Visit Diagnoses    Family history of breast cancer in first degree relative        Relevant Orders    MM Digital Diagnostic Unilat L    US BREAST COMPLETE UNI LEFT INC AXILLA    Obesity        Relevant Medications    Naltrexone-Bupropion HCl ER 8-90 MG TB12    Other Relevant Orders    Comprehensive metabolic panel    Impaired fasting glucose        Relevant Orders    Hemoglobin A1c       I have discontinued Ms. Luty's citalopram, benzonatate, chlorpheniramine-HYDROcodone, and predniSONE. I have also changed her amLODipine. Additionally, I am having her start on Naltrexone-Bupropion HCl ER. Lastly, I am having her maintain her spironolactone, metFORMIN, and phentermine.  Meds ordered this encounter  Medications  .  Naltrexone-Bupropion HCl ER 8-90 MG TB12    Sig: One tablet every morning for one week, then twice daily for one week.. Increase gradually to 2 tablets twice daily    Dispense:  120 tablet    Refill:  0  . amLODipine (NORVASC) 10 MG tablet    Sig: Take 1 tablet (10 mg total) by mouth daily.    Dispense:  30 tablet    Refill:  2    Medications Discontinued During This Encounter  Medication Reason  . benzonatate (TESSALON) 100 MG capsule Patient Discharge  . chlorpheniramine-HYDROcodone (TUSSIONEX PENNKINETIC ER) 10-8 MG/5ML SUER Patient Preference  . citalopram (CELEXA) 10 MG tablet Patient Preference  . predniSONE (DELTASONE) 50 MG tablet Completed Course  . amLODipine (NORVASC) 5 MG tablet Reorder    Follow-up: Return in about 4 weeks (around 11/18/2015) for FASTING LABS asap .   Sherlene Shams, MD

## 2015-10-23 NOTE — Assessment & Plan Note (Signed)
Diagnosed by sleep study. She is wearing her CPAP every night a minimum of 6 hours per night and notes improved wakefulness

## 2015-10-23 NOTE — Assessment & Plan Note (Addendum)
Treated in the past with  red yeast rice  twice daily,  Nonne in several months, She  will return for fasting lipids,  Untreated LDL was 190. Her 10 yr risk of CAD is 15% .   Lab Results  Component Value Date   CHOL 172 11/22/2013   HDL 42.20 11/22/2013   LDLCALC 108* 11/22/2013   LDLDIRECT 163.0 12/30/2014   TRIG 107.0 11/22/2013   CHOLHDL 4 11/22/2013

## 2015-10-23 NOTE — Assessment & Plan Note (Signed)
A total of 25 minutes of face to face time was spent with patient more than half of which was spent in counselling about her obesity and her efforts thus far to lose weight.  Advised her to consider a trial of Contrave . MOA discussed .  She will need to get her BP under control before starting the medication.  She will return for fasting labs ASAP

## 2015-10-23 NOTE — Assessment & Plan Note (Signed)
Diagnostic mammogram of left breast ordered.

## 2015-10-24 ENCOUNTER — Other Ambulatory Visit (INDEPENDENT_AMBULATORY_CARE_PROVIDER_SITE_OTHER): Payer: BC Managed Care – PPO

## 2015-10-24 DIAGNOSIS — R7301 Impaired fasting glucose: Secondary | ICD-10-CM

## 2015-10-24 DIAGNOSIS — E669 Obesity, unspecified: Secondary | ICD-10-CM

## 2015-10-24 DIAGNOSIS — I10 Essential (primary) hypertension: Secondary | ICD-10-CM

## 2015-10-24 LAB — LIPID PANEL
CHOLESTEROL: 181 mg/dL (ref 0–200)
HDL: 41.3 mg/dL (ref >39.00)
LDL Cholesterol: 116 mg/dL — ABNORMAL HIGH (ref 0–99)
NonHDL: 140.08
TRIGLYCERIDES: 122 mg/dL (ref 0.0–149.0)
Total CHOL/HDL Ratio: 4
VLDL: 24.4 mg/dL (ref 0.0–40.0)

## 2015-10-24 LAB — COMPREHENSIVE METABOLIC PANEL
ALT: 22 U/L (ref 0–35)
AST: 19 U/L (ref 0–37)
Albumin: 3.9 g/dL (ref 3.5–5.2)
Alkaline Phosphatase: 82 U/L (ref 39–117)
BUN: 19 mg/dL (ref 6–23)
CALCIUM: 9.2 mg/dL (ref 8.4–10.5)
CHLORIDE: 110 meq/L (ref 96–112)
CO2: 22 meq/L (ref 19–32)
Creatinine, Ser: 0.72 mg/dL (ref 0.40–1.20)
GFR: 105.43 mL/min (ref >60.00)
GLUCOSE: 124 mg/dL — AB (ref 70–99)
POTASSIUM: 3.7 meq/L (ref 3.5–5.1)
Sodium: 141 mEq/L (ref 135–145)
Total Bilirubin: 0.3 mg/dL (ref 0.2–1.2)
Total Protein: 6.6 g/dL (ref 6.0–8.3)

## 2015-10-24 LAB — MICROALBUMIN / CREATININE URINE RATIO
CREATININE, U: 166.4 mg/dL
MICROALB UR: 1.4 mg/dL (ref 0.0–1.9)
MICROALB/CREAT RATIO: 0.8 mg/g (ref 0.0–30.0)

## 2015-10-24 LAB — LDL CHOLESTEROL, DIRECT: Direct LDL: 121 mg/dL

## 2015-10-24 LAB — HEMOGLOBIN A1C: Hgb A1c MFr Bld: 5.9 % (ref 4.6–6.5)

## 2015-10-27 ENCOUNTER — Telehealth: Payer: Self-pay

## 2015-10-27 ENCOUNTER — Ambulatory Visit
Admission: RE | Admit: 2015-10-27 | Discharge: 2015-10-27 | Disposition: A | Discharge status: Home / Self Care | Payer: BC Managed Care – PPO | Source: Ambulatory Visit | Attending: Internal Medicine | Admitting: Internal Medicine

## 2015-10-27 DIAGNOSIS — N6320 Unspecified lump in the left breast, unspecified quadrant: Secondary | ICD-10-CM

## 2015-10-27 DIAGNOSIS — R928 Other abnormal and inconclusive findings on diagnostic imaging of breast: Secondary | ICD-10-CM | POA: Diagnosis not present

## 2015-10-27 DIAGNOSIS — Z803 Family history of malignant neoplasm of breast: Secondary | ICD-10-CM

## 2015-10-27 DIAGNOSIS — N63 Unspecified lump in breast: Secondary | ICD-10-CM | POA: Diagnosis present

## 2015-10-27 NOTE — Telephone Encounter (Signed)
Patient called and wanted to know if a PA was completed for Contrave? Have you started one? thanks

## 2015-10-27 NOTE — Telephone Encounter (Signed)
Pa started completed and faxed to patient plan . Awaiting decision. FYI

## 2015-10-28 ENCOUNTER — Encounter: Payer: Self-pay | Admitting: *Deleted

## 2015-10-30 ENCOUNTER — Encounter: Payer: Self-pay | Admitting: Internal Medicine

## 2015-11-03 NOTE — Telephone Encounter (Signed)
Attempted to notify the patient, unable to leave a VM.

## 2015-11-03 NOTE — Telephone Encounter (Signed)
Completed the PA over the phone, approved for 4 months.    Spoke with aLex.

## 2015-11-03 NOTE — Telephone Encounter (Signed)
Patient requested stated that the PA was never received and requested the nurse to all [phone]( speak with Clydie BraunKaren) She stated that this number can be called directly to give a prior authorization, and this matter can be resolved today.

## 2015-11-04 ENCOUNTER — Other Ambulatory Visit: Payer: Self-pay | Admitting: Internal Medicine

## 2015-11-04 MED ORDER — LORCASERIN HCL 10 MG PO TABS: 10.0000 mg | ORAL_TABLET | Freq: Two times a day (BID) | ORAL | Status: DC

## 2015-11-04 NOTE — Telephone Encounter (Signed)
Patient wants to try Belviq now that we have approval for Contrave.

## 2015-11-05 NOTE — Telephone Encounter (Signed)
Belviq faxed to pharmacy.

## 2015-11-07 ENCOUNTER — Encounter: Payer: Self-pay | Admitting: Internal Medicine

## 2015-11-07 ENCOUNTER — Telehealth: Payer: Self-pay

## 2015-11-07 NOTE — Telephone Encounter (Signed)
PA for Belviq completed on cover my meds.

## 2015-11-10 ENCOUNTER — Encounter: Payer: Self-pay | Admitting: Internal Medicine

## 2015-11-10 DIAGNOSIS — Z1239 Encounter for other screening for malignant neoplasm of breast: Secondary | ICD-10-CM

## 2015-11-10 NOTE — Telephone Encounter (Signed)
Please schedule patient Mammogram in [place].

## 2015-11-12 NOTE — Telephone Encounter (Signed)
PA for Belviq was approved from 11/11/2015-02/11/2016.

## 2015-11-21 ENCOUNTER — Encounter: Payer: Self-pay | Admitting: Internal Medicine

## 2015-11-25 ENCOUNTER — Encounter: Payer: Self-pay | Admitting: Internal Medicine

## 2015-11-25 ENCOUNTER — Ambulatory Visit (INDEPENDENT_AMBULATORY_CARE_PROVIDER_SITE_OTHER): Payer: BC Managed Care – PPO | Admitting: Internal Medicine

## 2015-11-25 VITALS — BP 146/84 | HR 95 | Temp 98.2°F | Resp 12 | Ht 60.0 in | Wt 195.0 lb

## 2015-11-25 DIAGNOSIS — I10 Essential (primary) hypertension: Secondary | ICD-10-CM

## 2015-11-25 DIAGNOSIS — G4733 Obstructive sleep apnea (adult) (pediatric): Secondary | ICD-10-CM

## 2015-11-25 DIAGNOSIS — E669 Obesity, unspecified: Secondary | ICD-10-CM

## 2015-11-25 MED ORDER — SPIRONOLACTONE 25 MG PO TABS: 25.0000 mg | ORAL_TABLET | Freq: Every day | ORAL | Status: DC

## 2015-11-25 NOTE — Progress Notes (Signed)
Subjective:  Patient ID: Penny Butler, female    DOB: 12-04-1950  Age: 65 y.o. MRN: [id number]  CC: The primary encounter diagnosis was Obesity (BMI 30-39.9). A diagnosis of OSA (obstructive sleep apnea) was also pertinent to this visit.  HPI Penny Butler presents for follow up on weight gain and obesit.  ,  Has failed therapy with phentermine and Belviq  Due to increased appetite.   Has not lost any weight since her last visit .  She not tolerate Belviq due to dizziness and light headedness symptoms which were intolerable and  resolved when she stopped the medication and bloating.  . Only took it for 5 days   She tried resuming phentermine since she had a few left, but did not feel that it helped.  She is not exercising regularly, but "trying" to get 10,000 steps in on a daily basis.   She is not interested in [place] . afer reading about the potential side effects.  She is not interested in bariatric surgery referral..   s Outpatient Prescriptions Prior to Visit  Medication Sig Dispense Refill  . amLODipine (NORVASC) 10 MG tablet Take 1 tablet (10 mg total) by mouth daily. 30 tablet 2  . metFORMIN (GLUCOPHAGE-XR) 500 MG 24 hr tablet Take 2 tablets (1,000 mg total) by mouth daily. 180 tablet 1  . phentermine (ADIPEX-P) 37.5 MG tablet 1/2 tablet in the am and early afternoon 30 tablet 2  . Lorcaserin HCl (BELVIQ) 10 MG TABS Take 10 mg by mouth 2 (two) times daily. (Patient not taking: Reported on 11/25/2015) 60 tablet 2  . Naltrexone-Bupropion HCl ER 8-90 MG TB12 One tablet every morning for one week, then twice daily for one week.. Increase gradually to 2 tablets twice daily (Patient not taking: Reported on 11/25/2015) 120 tablet 0  . spironolactone (ALDACTONE) 25 MG tablet Take 1 tablet (25 mg total) by mouth daily. (Patient not taking: Reported on 10/21/2015) 30 tablet 1   No facility-administered medications prior to visit.    Review of Systems;  Patient denies headache,  fevers, malaise, unintentional weight loss, skin rash, eye pain, sinus congestion and sinus pain, sore throat, dysphagia,  hemoptysis , cough, dyspnea, wheezing, chest pain, palpitations, orthopnea, edema, abdominal pain, nausea, melena, diarrhea, constipation, flank pain, dysuria, hematuria, urinary  Frequency, nocturia, numbness, tingling, seizures,  Focal weakness, Loss of consciousness,  Tremor, insomnia, depression, anxiety, and suicidal ideation.      Objective:  BP 146/84 mmHg  Pulse 95  Temp(Src) 98.2 F (36.8 C) (Oral)  Resp 12  Ht 5' (1.524 m)  Wt 195 lb (88.451 kg)  BMI 38.08 kg/m2  SpO2 99%  BP Readings from Last 3 Encounters:  11/25/15 146/84  10/21/15 140/90  07/25/15 140/92    Wt Readings from Last 3 Encounters:  11/25/15 195 lb (88.451 kg)  10/21/15 195 lb 12 oz (88.792 kg)  07/25/15 190 lb (86.183 kg)    General appearance: alert, cooperative and appears stated age Ears: normal TM's and external ear canals both ears Throat: lips, mucosa, and tongue normal; teeth and gums normal Neck: no adenopathy, no carotid bruit, supple, symmetrical, trachea midline and thyroid not enlarged, symmetric, no tenderness/mass/nodules Back: symmetric, no curvature. ROM normal. No CVA tenderness. Lungs: clear to auscultation bilaterally Heart: regular rate and rhythm, S1, S2 normal, no murmur, click, rub or gallop Abdomen: soft, non-tender; bowel sounds normal; no masses,  no organomegaly Pulses: 2+ and symmetric Skin: Skin color, texture, turgor normal. No rashes or  lesions Lymph nodes: Cervical, supraclavicular, and axillary nodes normal.  Lab Results  Component Value Date   HGBA1C 5.9 10/24/2015   HGBA1C 5.7 08/09/2013   HGBA1C 5.4 06/10/2011    Lab Results  Component Value Date   CREATININE 0.72 10/24/2015   CREATININE 1.03 12/30/2014   CREATININE 0.8 11/22/2013    Lab Results  Component Value Date   WBC 8.1 12/30/2014   HGB 13.8 12/30/2014   HCT 41.1  12/30/2014   PLT 283.0 12/30/2014   GLUCOSE 124* 10/24/2015   CHOL 181 10/24/2015   TRIG 122.0 10/24/2015   HDL 41.30 10/24/2015   LDLDIRECT 121.0 10/24/2015   LDLCALC 116* 10/24/2015   ALT 22 10/24/2015   AST 19 10/24/2015   NA 141 10/24/2015   K 3.7 10/24/2015   CL 110 10/24/2015   CREATININE 0.72 10/24/2015   BUN 19 10/24/2015   CO2 22 10/24/2015   TSH 1.66 12/30/2014   HGBA1C 5.9 10/24/2015   MICROALBUR 1.4 10/24/2015    US Breast Complete Uni Left Inc Axilla  10/27/2015  CLINICAL DATA:  Small mass felt by the patient in the upper outer left breast. Her sister was recently diagnosed with breast cancer. EXAM: 2D DIGITAL DIAGNOSTIC LEFT MAMMOGRAM WITH CAD AND ADJUNCT TOMO ULTRASOUND LEFT BREAST COMPARISON:  Previous exam(s). ACR Breast Density Category b: There are scattered areas of fibroglandular density. FINDINGS: Stable mammographic appearance of the left breast with no interval findings suspicious for malignancy. Mammographic images were processed with CAD. On physical exam, there are multiple small, rounded palpable fat lobules in the upper outer left breast in the area of patient concern. Targeted ultrasound is performed, showing normal appearing breast tissue throughout the upper outer left breast in the area of patient concern, including normal appearing fat lobules. IMPRESSION: No evidence of malignancy. RECOMMENDATION: Bilateral screening mammogram in 2 months. That will be 1 year since mammographic evaluation of the right breast. I have discussed the findings and recommendations with the patient. Results were also provided in writing at the conclusion of the visit. If applicable, a reminder letter will be sent to the patient regarding the next appointment. BI-RADS CATEGORY  1: Negative. Electronically Signed   By: Beckie Salts M.D.   On: 10/27/2015 16:34   Mm Diag Breast Tomo Uni Left  10/27/2015  CLINICAL DATA:  Small mass felt by the patient in the upper outer left breast. Her  sister was recently diagnosed with breast cancer. EXAM: 2D DIGITAL DIAGNOSTIC LEFT MAMMOGRAM WITH CAD AND ADJUNCT TOMO ULTRASOUND LEFT BREAST COMPARISON:  Previous exam(s). ACR Breast Density Category b: There are scattered areas of fibroglandular density. FINDINGS: Stable mammographic appearance of the left breast with no interval findings suspicious for malignancy. Mammographic images were processed with CAD. On physical exam, there are multiple small, rounded palpable fat lobules in the upper outer left breast in the area of patient concern. Targeted ultrasound is performed, showing normal appearing breast tissue throughout the upper outer left breast in the area of patient concern, including normal appearing fat lobules. IMPRESSION: No evidence of malignancy. RECOMMENDATION: Bilateral screening mammogram in 2 months. That will be 1 year since mammographic evaluation of the right breast. I have discussed the findings and recommendations with the patient. Results were also provided in writing at the conclusion of the visit. If applicable, a reminder letter will be sent to the patient regarding the next appointment. BI-RADS CATEGORY  1: Negative. Electronically Signed   By: Beckie Salts M.D.   On: 10/27/2015  16:34    Assessment & Plan:   Problem List Items Addressed This Visit    Obesity (BMI 30-39.9) - Primary    Patient is resistant to therapy and not interested in referral to weight management center or baritric surgery referral       OSA (obstructive sleep apnea)    Diagnosed by prior sleep study. Patient is using CPAP every night a minimum of 6 hours per night and notes improved daytime wakefulness and decreased fatigue          I have discontinued Ms. Capes's phentermine, Naltrexone-Bupropion HCl ER, and Lorcaserin HCl. I am also having her maintain her metFORMIN, amLODipine, and spironolactone.  Meds ordered this encounter  Medications  . spironolactone (ALDACTONE) 25 MG tablet    Sig:  Take 1 tablet (25 mg total) by mouth daily.    Dispense:  30 tablet    Refill:  5    Medications Discontinued During This Encounter  Medication Reason  . spironolactone (ALDACTONE) 25 MG tablet Reorder  . Lorcaserin HCl (BELVIQ) 10 MG TABS   . phentermine (ADIPEX-P) 37.5 MG tablet   . Naltrexone-Bupropion HCl ER 8-90 MG TB12     Follow-up: Return in about 6 months (around 05/26/2016) for follow up elevated blood sugar.   Sherlene ShamsULLO, TERESA L, MD

## 2015-11-25 NOTE — Patient Instructions (Addendum)
Your blood pressure is elevated today.  Please have it checked again by your school's nurse.  Goal for BP is 130/80  Please return in 6 months to follow up on your elevated fasting glucose that was noted last month .  We will check an A1c at that time.   DASH Eating Plan DASH stands for "Dietary Approaches to Stop Hypertension." The DASH eating plan is a healthy eating plan that has been shown to reduce high blood pressure (hypertension). Additional health benefits may include reducing the risk of type 2 diabetes mellitus, heart disease, and stroke. The DASH eating plan may also help with weight loss. WHAT DO I NEED TO KNOW ABOUT THE DASH EATING PLAN? For the DASH eating plan, you will follow these general guidelines:  Choose foods with a percent daily value for sodium of less than 5% (as listed on the food label).  Use salt-free seasonings or herbs instead of table salt or sea salt.  Check with your health care provider or pharmacist before using salt substitutes.  Eat lower-sodium products, often labeled as "lower sodium" or "no salt added."  Eat fresh foods.  Eat more vegetables, fruits, and low-fat dairy products.  Choose whole grains. Look for the word "whole" as the first word in the ingredient list.  Choose fish and skinless chicken or [place] more often than red meat. Limit fish, poultry, and meat to 6 oz (170 g) each day.  Limit sweets, desserts, sugars, and sugary drinks.  Choose heart-healthy fats.  Limit cheese to 1 oz (28 g) per day.  Eat more home-cooked food and less restaurant, buffet, and fast food.  Limit fried foods.  Cook foods using methods other than frying.  Limit canned vegetables. If you do use them, rinse them well to decrease the sodium.  When eating at a restaurant, ask that your food be prepared with less salt, or no salt if possible. WHAT FOODS CAN I EAT? Seek help from a dietitian for individual calorie needs. Grains Whole grain or whole wheat  bread. Brown rice. Whole grain or whole wheat pasta. Quinoa, bulgur, and whole grain cereals. Low-sodium cereals. Corn or whole wheat flour tortillas. Whole grain cornbread. Whole grain crackers. Low-sodium crackers. Vegetables Fresh or frozen vegetables (raw, steamed, roasted, or grilled). Low-sodium or reduced-sodium tomato and vegetable juices. Low-sodium or reduced-sodium tomato sauce and paste. Low-sodium or reduced-sodium canned vegetables.  Fruits All fresh, canned (in natural juice), or frozen fruits. Meat and Other Protein Products Ground beef (85% or leaner), grass-fed beef, or beef trimmed of fat. Skinless chicken or [place]. Ground chicken or [place]. Pork trimmed of fat. All fish and seafood. Eggs. Dried beans, peas, or lentils. Unsalted nuts and seeds. Unsalted canned beans. Dairy Low-fat dairy products, such as skim or 1% milk, 2% or reduced-fat cheeses, low-fat ricotta or cottage cheese, or plain low-fat yogurt. Low-sodium or reduced-sodium cheeses. Fats and Oils Tub margarines without trans fats. Light or reduced-fat mayonnaise and salad dressings (reduced sodium). Avocado. Safflower, olive, or canola oils. Natural peanut or almond butter. Other Unsalted popcorn and pretzels. The items listed above may not be a complete list of recommended foods or beverages. Contact your dietitian for more options. WHAT FOODS ARE NOT RECOMMENDED? Grains White bread. White pasta. White rice. Refined cornbread. Bagels and croissants. Crackers that contain trans fat. Vegetables Creamed or fried vegetables. Vegetables in a cheese sauce. Regular canned vegetables. Regular canned tomato sauce and paste. Regular tomato and vegetable juices. Fruits Dried fruits. Canned fruit in light  or heavy syrup. Fruit juice. Meat and Other Protein Products Fatty cuts of meat. Ribs, chicken wings, bacon, sausage, bologna, salami, chitterlings, fatback, hot dogs, bratwurst, and packaged luncheon meats. Salted nuts and  seeds. Canned beans with salt. Dairy Whole or 2% milk, cream, half-and-half, and cream cheese. Whole-fat or sweetened yogurt. Full-fat cheeses or blue cheese. Nondairy creamers and whipped toppings. Processed cheese, cheese spreads, or cheese curds. Condiments Onion and garlic salt, seasoned salt, table salt, and sea salt. Canned and packaged gravies. Worcestershire sauce. Tartar sauce. Barbecue sauce. Teriyaki sauce. Soy sauce, including reduced sodium. Steak sauce. Fish sauce. Oyster sauce. Cocktail sauce. Horseradish. Ketchup and mustard. Meat flavorings and tenderizers. Bouillon cubes. Hot sauce. Tabasco sauce. Marinades. Taco seasonings. Relishes. Fats and Oils Butter, stick margarine, lard, shortening, ghee, and bacon fat. Coconut, palm kernel, or palm oils. Regular salad dressings. Other Pickles and olives. Salted popcorn and pretzels. The items listed above may not be a complete list of foods and beverages to avoid. Contact your dietitian for more information. WHERE CAN I FIND MORE INFORMATION? National Heart, Lung, and Blood Institute: [employer]   This information is not intended to replace advice given to you by your health care provider. Make sure you discuss any questions you have with your health care provider.   Document Released: 05/27/2011 Document Revised: 06/28/2014 Document Reviewed: 04/11/2013 Elsevier Interactive Patient Education [employer]

## 2015-11-25 NOTE — Progress Notes (Signed)
Pre-visit discussion using our clinic review tool. No additional management support is needed unless otherwise documented below in the visit note.  

## 2015-11-27 NOTE — Assessment & Plan Note (Signed)
Diagnosed by prior sleep study. Patient is using CPAP every night a minimum of 6 hours per night and notes improved daytime wakefulness and decreased fatigue  

## 2015-11-27 NOTE — Assessment & Plan Note (Signed)
Not at goal today on amlodipine 10 mg daily  .  Patient advised to get BP checked at work and report readings.  ie

## 2015-11-27 NOTE — Assessment & Plan Note (Signed)
Patient is resistant to therapy and not interested in referral to weight management center or baritric surgery referral

## 2015-12-25 ENCOUNTER — Ambulatory Visit: Admission: RE | Admit: 2015-12-25 | Payer: BC Managed Care – PPO | Source: Ambulatory Visit

## 2016-01-02 ENCOUNTER — Ambulatory Visit
Admission: RE | Admit: 2016-01-02 | Discharge: 2016-01-02 | Disposition: A | Discharge status: Home / Self Care | Payer: BC Managed Care – PPO | Source: Ambulatory Visit | Attending: Internal Medicine | Admitting: Internal Medicine

## 2016-01-02 ENCOUNTER — Other Ambulatory Visit: Payer: Self-pay | Admitting: Internal Medicine

## 2016-01-02 DIAGNOSIS — Z1239 Encounter for other screening for malignant neoplasm of breast: Secondary | ICD-10-CM

## 2016-01-02 DIAGNOSIS — Z1231 Encounter for screening mammogram for malignant neoplasm of breast: Secondary | ICD-10-CM | POA: Diagnosis not present

## 2016-01-05 ENCOUNTER — Other Ambulatory Visit: Payer: Self-pay | Admitting: Internal Medicine

## 2016-05-20 ENCOUNTER — Other Ambulatory Visit: Payer: Self-pay | Admitting: Internal Medicine

## 2016-09-15 ENCOUNTER — Ambulatory Visit: Payer: BC Managed Care – PPO | Admitting: Family Medicine

## 2016-09-17 ENCOUNTER — Other Ambulatory Visit: Payer: Self-pay | Admitting: Internal Medicine

## 2016-10-19 ENCOUNTER — Other Ambulatory Visit: Payer: Self-pay | Admitting: Internal Medicine

## 2016-11-28 ENCOUNTER — Other Ambulatory Visit: Payer: Self-pay | Admitting: Internal Medicine

## 2016-11-29 NOTE — Telephone Encounter (Signed)
Refilled: 10/20/2016 Last OV: 11/25/2015 Next OV: not scheduled

## 2016-11-30 NOTE — Telephone Encounter (Signed)
Is it ok to schedule the pt for tomorrow at 11:30am so she doesn't run out of her medication. Pt stated that the reason she hasn't scheduled an appt is because of her insurance. She stated that the state only approved her for the 70/30 plan and she can't afford to pay a big bill, but she is willing to come in for an appt.

## 2016-12-02 NOTE — Telephone Encounter (Signed)
if THE 11:30 SLOT ON Thursday IS STIll available yes,

## 2016-12-02 NOTE — Telephone Encounter (Signed)
Patient requested to be seen next Thursday at 1130 if possible . Please call pt ASAP -per pt request  Pt contact [phone]

## 2016-12-02 NOTE — Telephone Encounter (Signed)
Is it ok the reschedule the pt for next Thursday at 11:30am?

## 2016-12-07 NOTE — Telephone Encounter (Signed)
Spoke with pt and informed her that the Thursday at 11:30appt has been booked already. The pt was not very happy because she was told that it was available last week. The pt was offered the 8:00am appt for tomorrow but stated that she could not make that appt because she has a Animal nutritionistfuneral tomorrow. So the pt has been scheduled for Friday at 11:30am.

## 2016-12-10 ENCOUNTER — Encounter: Payer: Self-pay | Admitting: Internal Medicine

## 2016-12-10 ENCOUNTER — Ambulatory Visit (INDEPENDENT_AMBULATORY_CARE_PROVIDER_SITE_OTHER): Payer: BC Managed Care – PPO | Admitting: Internal Medicine

## 2016-12-10 VITALS — BP 150/84 | HR 83 | Temp 98.6°F | Resp 17 | Ht 60.0 in | Wt 197.4 lb

## 2016-12-10 DIAGNOSIS — E785 Hyperlipidemia, unspecified: Secondary | ICD-10-CM | POA: Diagnosis not present

## 2016-12-10 DIAGNOSIS — I10 Essential (primary) hypertension: Secondary | ICD-10-CM | POA: Diagnosis not present

## 2016-12-10 DIAGNOSIS — F411 Generalized anxiety disorder: Secondary | ICD-10-CM

## 2016-12-10 DIAGNOSIS — G4733 Obstructive sleep apnea (adult) (pediatric): Secondary | ICD-10-CM | POA: Diagnosis not present

## 2016-12-10 DIAGNOSIS — R7303 Prediabetes: Secondary | ICD-10-CM | POA: Diagnosis not present

## 2016-12-10 DIAGNOSIS — E7800 Pure hypercholesterolemia, unspecified: Secondary | ICD-10-CM

## 2016-12-10 LAB — COMPREHENSIVE METABOLIC PANEL
ALBUMIN: 4.4 g/dL (ref 3.5–5.2)
ALT: 23 U/L (ref 0–35)
AST: 17 U/L (ref 0–37)
Alkaline Phosphatase: 81 U/L (ref 39–117)
BUN: 17 mg/dL (ref 6–23)
CHLORIDE: 106 meq/L (ref 96–112)
CO2: 25 mEq/L (ref 19–32)
Calcium: 10.1 mg/dL (ref 8.4–10.5)
Creatinine, Ser: 0.79 mg/dL (ref 0.40–1.20)
GFR: 94.38 mL/min (ref >60.00)
GLUCOSE: 113 mg/dL — AB (ref 70–99)
POTASSIUM: 3.8 meq/L (ref 3.5–5.1)
Sodium: 141 mEq/L (ref 135–145)
TOTAL PROTEIN: 7 g/dL (ref 6.0–8.3)
Total Bilirubin: 0.5 mg/dL (ref 0.2–1.2)

## 2016-12-10 LAB — MICROALBUMIN / CREATININE URINE RATIO
CREATININE, U: 128.1 mg/dL
MICROALB/CREAT RATIO: 1.2 mg/g (ref 0.0–30.0)
Microalb, Ur: 1.6 mg/dL (ref 0.0–1.9)

## 2016-12-10 LAB — HEMOGLOBIN A1C: HEMOGLOBIN A1C: 5.7 % (ref 4.6–6.5)

## 2016-12-10 LAB — LIPID PANEL
Cholesterol: 219 mg/dL — ABNORMAL HIGH (ref 0–200)
HDL: 44.1 mg/dL (ref >39.00)
LDL CALC: 157 mg/dL — AB (ref 0–99)
NONHDL: 175.15
Total CHOL/HDL Ratio: 5
Triglycerides: 91 mg/dL (ref 0.0–149.0)
VLDL: 18.2 mg/dL (ref 0.0–40.0)

## 2016-12-10 NOTE — Patient Instructions (Signed)
Your blood pressure is above the currently recommended agoal of 130/80, so I am recommending that you continue the amlodipne and have your BP checked at your local pharmacy a few times over the next 2 weeks and send me the readings so I can decide if additional therapy is needed   Hypertension Hypertension, commonly called high blood pressure, is when the force of blood pumping through the arteries is too strong. The arteries are the blood vessels that carry blood from the heart throughout the body. Hypertension forces the heart to work harder to pump blood and may cause arteries to become narrow or stiff. Having untreated or uncontrolled hypertension can cause heart attacks, strokes, kidney disease, and other problems. A blood pressure reading consists of a higher number over a lower number. Ideally, your blood pressure should be below 120/80. The first ("top") number is called the systolic pressure. It is a measure of the pressure in your arteries as your heart beats. The second ("bottom") number is called the diastolic pressure. It is a measure of the pressure in your arteries as the heart relaxes. What are the causes? The cause of this condition is not known. What increases the risk? Some risk factors for high blood pressure are under your control. Others are not. Factors you can change  Smoking.  Having type 2 diabetes mellitus, high cholesterol, or both.  Not getting enough exercise or physical activity.  Being overweight.  Having too much fat, sugar, calories, or salt (sodium) in your diet.  Drinking too much alcohol. Factors that are difficult or impossible to change  Having chronic kidney disease.  Having a family history of high blood pressure.  Age. Risk increases with age.  Race. You may be at higher risk if you are African-American.  Gender. Men are at higher risk than women before age 69. After age 80, women are at higher risk than men.  Having obstructive sleep  apnea.  Stress. What are the signs or symptoms? Extremely high blood pressure (hypertensive crisis) may cause:  Headache.  Anxiety.  Shortness of breath.  Nosebleed.  Nausea and vomiting.  Severe chest pain.  Jerky movements you cannot control (seizures).  How is this diagnosed? This condition is diagnosed by measuring your blood pressure while you are seated, with your arm resting on a surface. The cuff of the blood pressure monitor will be placed directly against the skin of your upper arm at the level of your heart. It should be measured at least twice using the same arm. Certain conditions can cause a difference in blood pressure between your right and left arms. Certain factors can cause blood pressure readings to be lower or higher than normal (elevated) for a short period of time:  When your blood pressure is higher when you are in a health care provider's office than when you are at home, this is called white coat hypertension. Most people with this condition do not need medicines.  When your blood pressure is higher at home than when you are in a health care provider's office, this is called masked hypertension. Most people with this condition may need medicines to control blood pressure.  If you have a high blood pressure reading during one visit or you have normal blood pressure with other risk factors:  You may be asked to return on a different day to have your blood pressure checked again.  You may be asked to monitor your blood pressure at home for 1 week or longer.  If you are diagnosed with hypertension, you may have other blood or imaging tests to help your health care provider understand your overall risk for other conditions. How is this treated? This condition is treated by making healthy lifestyle changes, such as eating healthy foods, exercising more, and reducing your alcohol intake. Your health care provider may prescribe medicine if lifestyle changes are  not enough to get your blood pressure under control, and if:  Your systolic blood pressure is above 130.  Your diastolic blood pressure is above 80.  Your personal target blood pressure may vary depending on your medical conditions, your age, and other factors. Follow these instructions at home: Eating and drinking  Eat a diet that is high in fiber and potassium, and low in sodium, added sugar, and fat. An example eating plan is called the DASH (Dietary Approaches to Stop Hypertension) diet. To eat this way: ? Eat plenty of fresh fruits and vegetables. Try to fill half of your plate at each meal with fruits and vegetables. ? Eat whole grains, such as whole wheat pasta, brown rice, or whole grain bread. Fill about one quarter of your plate with whole grains. ? Eat or drink low-fat dairy products, such as skim milk or low-fat yogurt. ? Avoid fatty cuts of meat, processed or cured meats, and poultry with skin. Fill about one quarter of your plate with lean proteins, such as fish, chicken without skin, beans, eggs, and tofu. ? Avoid premade and processed foods. These tend to be higher in sodium, added sugar, and fat.  Reduce your daily sodium intake. Most people with hypertension should eat less than 1,500 mg of sodium a day.  Limit alcohol intake to no more than 1 drink a day for nonpregnant women and 2 drinks a day for men. One drink equals 12 oz of beer, 5 oz of wine, or 1 oz of hard liquor. Lifestyle  Work with your health care provider to maintain a healthy body weight or to lose weight. Ask what an ideal weight is for you.  Get at least 30 minutes of exercise that causes your heart to beat faster (aerobic exercise) most days of the week. Activities may include walking, swimming, or biking.  Include exercise to strengthen your muscles (resistance exercise), such as pilates or lifting weights, as part of your weekly exercise routine. Try to do these types of exercises for 30 minutes at  least 3 days a week.  Do not use any products that contain nicotine or tobacco, such as cigarettes and e-cigarettes. If you need help quitting, ask your health care provider.  Monitor your blood pressure at home as told by your health care provider.  Keep all follow-up visits as told by your health care provider. This is important. Medicines  Take over-the-counter and prescription medicines only as told by your health care provider. Follow directions carefully. Blood pressure medicines must be taken as prescribed.  Do not skip doses of blood pressure medicine. Doing this puts you at risk for problems and can make the medicine less effective.  Ask your health care provider about side effects or reactions to medicines that you should watch for. Contact a health care provider if:  You think you are having a reaction to a medicine you are taking.  You have headaches that keep coming back (recurring).  You feel dizzy.  You have swelling in your ankles.  You have trouble with your vision. Get help right away if:  You develop a severe headache  or confusion.  You have unusual weakness or numbness.  You feel faint.  You have severe pain in your chest or abdomen.  You vomit repeatedly.  You have trouble breathing. Summary  Hypertension is when the force of blood pumping through your arteries is too strong. If this condition is not controlled, it may put you at risk for serious complications.  Your personal target blood pressure may vary depending on your medical conditions, your age, and other factors. For most people, a normal blood pressure is less than 120/80.  Hypertension is treated with lifestyle changes, medicines, or a combination of both. Lifestyle changes include weight loss, eating a healthy, low-sodium diet, exercising more, and limiting alcohol. This information is not intended to replace advice given to you by your health care provider. Make sure you discuss any  questions you have with your health care provider. Document Released: 06/07/2005 Document Revised: 05/05/2016 Document Reviewed: 05/05/2016 Elsevier Interactive Patient Education  [employer]2018 Elsevier Inc.

## 2016-12-10 NOTE — Assessment & Plan Note (Signed)
she reports compliance with medication regimen  but has an elevated reading today in office.  He has been asked to check his BP at work and  submit readings for evaluation. Renal function will be checked today  

## 2016-12-10 NOTE — Progress Notes (Addendum)
Subjective:  Patient ID: Penny Butler, female    DOB: 22-Mar-1951  Age: 66 y.o. MRN: [id number]  CC: The primary encounter diagnosis was Prediabetes. Diagnoses of Hyperlipidemia LDL goal <130, Essential hypertension, Pure hypercholesterolemia, GAD (generalized anxiety disorder), and OSA (obstructive sleep apnea) were also pertinent to this visit.  HPI:  Penny Butler presents for hypertension follow up. Last seen over a year ago.    She is very distressed today because her 36 yr old husband Greggory Stallion has been hospitalized since June 20 with an acute MI and is scheduled to be transferred to [facility] for CABG. She has been staying with him at the hospital.  She NEVER STARTED THE SPIRONOLACTONE.  Not taking the metformin for weight loss    She was diagnosed with sleep apnea last year an has been using her CPAP with good results, except when she has been staying with husband in hospital.   Last meal was  A small egg and sausage breakfast 4 hours ago   Lab Results  Component Value Date   HGBA1C 5.7 12/10/2016          Outpatient Medications Prior to Visit  Medication Sig Dispense Refill  . amLODipine (NORVASC) 10 MG tablet TAKE 1 TABLET BY MOUTH DAILY 30 tablet 0  . metFORMIN (GLUCOPHAGE-XR) 500 MG 24 hr tablet TAKE 2 TABLETS BY MOUTH EVERY DAY (Patient not taking: Reported on 12/10/2016) 180 tablet 2  . spironolactone (ALDACTONE) 25 MG tablet Take 1 tablet (25 mg total) by mouth daily. (Patient not taking: Reported on 12/10/2016) 30 tablet 5   No facility-administered medications prior to visit.     Review of Systems;  Patient denies headache, fevers, malaise, unintentional weight loss, skin rash, eye pain, sinus congestion and sinus pain, sore throat, dysphagia,  hemoptysis , cough, dyspnea, wheezing, chest pain, palpitations, orthopnea, edema, abdominal pain, nausea, melena, diarrhea, constipation, flank pain, dysuria, hematuria, urinary  Frequency, nocturia, numbness,  tingling, seizures,  Focal weakness, Loss of consciousness,  Tremor, insomnia, depression, anxiety, and suicidal ideation.      Objective:  BP (!) 150/84 (BP Location: Left Arm, Patient Position: Sitting, Cuff Size: Large)   Pulse 83   Temp 98.6 F (37 C) (Oral)   Resp 17   Ht 5' (1.524 m)   Wt 197 lb 6.4 oz (89.5 kg)   SpO2 98%   BMI 38.55 kg/m   BP Readings from Last 3 Encounters:  12/10/16 (!) 150/84  11/25/15 (!) 146/84  10/21/15 140/90    Wt Readings from Last 3 Encounters:  12/10/16 197 lb 6.4 oz (89.5 kg)  11/25/15 195 lb (88.5 kg)  10/21/15 195 lb 12 oz (88.8 kg)    General appearance: alert, cooperative and appears stated age Ears: normal TM's and external ear canals both ears Throat: lips, mucosa, and tongue normal; teeth and gums normal Neck: no adenopathy, no carotid bruit, supple, symmetrical, trachea midline and thyroid not enlarged, symmetric, no tenderness/mass/nodules Back: symmetric, no curvature. ROM normal. No CVA tenderness. Lungs: clear to auscultation bilaterally Heart: regular rate and rhythm, S1, S2 normal, no murmur, click, rub or gallop Abdomen: soft, non-tender; bowel sounds normal; no masses,  no organomegaly Pulses: 2+ and symmetric Skin: Skin color, texture, turgor normal. No rashes or lesions Lymph nodes: Cervical, supraclavicular, and axillary nodes normal.  Lab Results  Component Value Date   HGBA1C 5.7 12/10/2016   HGBA1C 5.9 10/24/2015   HGBA1C 5.7 08/09/2013    Lab Results  Component Value Date  CREATININE 0.79 12/10/2016   CREATININE 0.72 10/24/2015   CREATININE 1.03 12/30/2014    Lab Results  Component Value Date   WBC 8.1 12/30/2014   HGB 13.8 12/30/2014   HCT 41.1 12/30/2014   PLT 283.0 12/30/2014   GLUCOSE 113 (H) 12/10/2016   CHOL 219 (H) 12/10/2016   TRIG 91.0 12/10/2016   HDL 44.10 12/10/2016   LDLDIRECT 121.0 10/24/2015   LDLCALC 157 (H) 12/10/2016   ALT 23 12/10/2016   AST 17 12/10/2016   NA 141  12/10/2016   K 3.8 12/10/2016   CL 106 12/10/2016   CREATININE 0.79 12/10/2016   BUN 17 12/10/2016   CO2 25 12/10/2016   TSH 1.66 12/30/2014   HGBA1C 5.7 12/10/2016   MICROALBUR 1.6 12/10/2016    Mm Screening Breast Tomo Bilateral  Result Date: 01/06/2016 CLINICAL DATA:  Screening. EXAM: 2D DIGITAL SCREENING BILATERAL MAMMOGRAM WITH CAD AND ADJUNCT TOMO COMPARISON:  Previous exam(s). ACR Breast Density Category b: There are scattered areas of fibroglandular density. FINDINGS: There are no findings suspicious for malignancy. Images were processed with CAD. IMPRESSION: No mammographic evidence of malignancy. A result letter of this screening mammogram will be mailed directly to the patient. RECOMMENDATION: Screening mammogram in one year. (Code:SM-B-01Y) BI-RADS CATEGORY  1: Negative. Electronically Signed   By: Bary RichardStan  Maynard M.D.   On: 01/06/2016 14:15    Assessment & Plan:   Problem List Items Addressed This Visit    GAD (generalized anxiety disorder)    Previously treated with citalopram. She has now retired and feels that she is managing stress better        Hyperlipidemia    Treated in the past with  red yeast rice  twice daily,  Now with diet alone.  Her lat Untreated LDL was 190. Her 10 yr risk of CAD is 15% .   Lab Results  Component Value Date   CHOL 181 10/24/2015   HDL 41.30 10/24/2015   LDLCALC 116 (H) 10/24/2015   LDLDIRECT 121.0 10/24/2015   TRIG 122.0 10/24/2015   CHOLHDL 4 10/24/2015          Hypertension    she reports compliance with medication regimen  but has an elevated reading today in office.  He has been asked to check his BP at work and  submit readings for evaluation. Renal function will be checked today      OSA (obstructive sleep apnea)    Diagnosed by sleep study. She is wearing her CPAP every night a minimum of 6 hours per night and notes improved daytime wakefulness and decreased fatigue        Other Visit Diagnoses    Prediabetes    -   Primary   Relevant Orders   Comprehensive metabolic panel (Completed)   Hemoglobin A1c (Completed)   Microalbumin / creatinine urine ratio (Completed)   Hyperlipidemia LDL goal <130       Relevant Orders   Lipid panel (Completed)      I have discontinued Penny Butler's spironolactone and metFORMIN.  No orders of the defined types were placed in this encounter.   Medications Discontinued During This Encounter  Medication Reason  . metFORMIN (GLUCOPHAGE-XR) 500 MG 24 hr tablet Patient has not taken in last 30 days  . spironolactone (ALDACTONE) 25 MG tablet Patient has not taken in last 30 days    Follow-up: Return in about 6 months (around 06/11/2017).   Sherlene Shamseresa L Mearl Olver, MD

## 2016-12-10 NOTE — Assessment & Plan Note (Signed)
Previously treated with citalopram. She has now retired and feels that she is managing stress better

## 2016-12-10 NOTE — Assessment & Plan Note (Signed)
Treated in the past with  red yeast rice  twice daily,  Now with diet alone.  Her lat Untreated LDL was 190. Her 10 yr risk of CAD is 15% .   Lab Results  Component Value Date   CHOL 181 10/24/2015   HDL 41.30 10/24/2015   LDLCALC 116 (H) 10/24/2015   LDLDIRECT 121.0 10/24/2015   TRIG 122.0 10/24/2015   CHOLHDL 4 10/24/2015

## 2016-12-11 ENCOUNTER — Encounter: Payer: Self-pay | Admitting: Internal Medicine

## 2016-12-13 ENCOUNTER — Other Ambulatory Visit: Payer: Self-pay | Admitting: Internal Medicine

## 2016-12-24 ENCOUNTER — Other Ambulatory Visit: Payer: Self-pay | Admitting: Internal Medicine

## 2016-12-24 DIAGNOSIS — Z1231 Encounter for screening mammogram for malignant neoplasm of breast: Secondary | ICD-10-CM

## 2016-12-27 ENCOUNTER — Telehealth: Payer: Self-pay | Admitting: *Deleted

## 2016-12-27 NOTE — Telephone Encounter (Signed)
Pt requested lab results  Pt contact [phone]

## 2016-12-27 NOTE — Telephone Encounter (Signed)
Please advise 

## 2016-12-27 NOTE — Telephone Encounter (Signed)
Spoke with pt and she stated that she does not have mychart anymore. The pt requested to have lab results mailed to. Told pt that the results were put in the mail to her today. Pt stated that she would wait to get them in the mail.

## 2016-12-27 NOTE — Telephone Encounter (Signed)
It was e mailed to her on June 23,  [place]d and [place] mailed the unread e mail to her today

## 2016-12-27 NOTE — Telephone Encounter (Signed)
Mailed unread message to patient.  

## 2017-01-16 ENCOUNTER — Other Ambulatory Visit: Payer: Self-pay | Admitting: Family

## 2017-01-28 ENCOUNTER — Ambulatory Visit
Admission: RE | Admit: 2017-01-28 | Discharge: 2017-01-28 | Disposition: A | Discharge status: Home / Self Care | Payer: BC Managed Care – PPO | Source: Ambulatory Visit | Attending: Internal Medicine | Admitting: Internal Medicine

## 2017-01-28 DIAGNOSIS — Z1231 Encounter for screening mammogram for malignant neoplasm of breast: Secondary | ICD-10-CM | POA: Insufficient documentation

## 2017-02-14 ENCOUNTER — Other Ambulatory Visit: Payer: Self-pay | Admitting: Internal Medicine

## 2017-03-13 ENCOUNTER — Other Ambulatory Visit: Payer: Self-pay | Admitting: Internal Medicine

## 2017-03-24 ENCOUNTER — Telehealth: Payer: Self-pay | Admitting: Internal Medicine

## 2017-03-24 DIAGNOSIS — G4733 Obstructive sleep apnea (adult) (pediatric): Secondary | ICD-10-CM

## 2017-03-24 DIAGNOSIS — Z9989 Dependence on other enabling machines and devices: Principal | ICD-10-CM

## 2017-03-24 NOTE — Telephone Encounter (Signed)
Pt called requesting [place] to fax over a prescription to [facility] for CPAP supplies. Please call pt when completed. Thank you!   Call pt @ [phone]

## 2017-03-25 NOTE — Telephone Encounter (Signed)
DME ORDER PRINTED  

## 2017-03-25 NOTE — Telephone Encounter (Signed)
Is this just done like a DME order?

## 2017-03-25 NOTE — Telephone Encounter (Signed)
Attempted to call pt. No answer no voicemail. DME order for CPAP supplies has been faxed to [facility].

## 2017-05-09 ENCOUNTER — Telehealth: Payer: Self-pay

## 2017-05-09 DIAGNOSIS — G4733 Obstructive sleep apnea (adult) (pediatric): Secondary | ICD-10-CM

## 2017-05-09 NOTE — Telephone Encounter (Signed)
Can you addend an office note stating that the pt has been compliant and benefiting from her cpap machine.

## 2017-05-09 NOTE — Assessment & Plan Note (Signed)
Diagnosed by sleep study. She is wearing her CPAP every night a minimum of 6 hours per night and notes improved daytime wakefulness and decreased fatigue  

## 2017-05-09 NOTE — Telephone Encounter (Signed)
Copied from CRM [id number]. Topic: General - Other >> May 09, 2017  9:56 AM Percival SpanishKennedy, Cheryl W wrote:  Brett Albinoaven with [employer] call to say she need some amending notes saying pt is complying and benefiting from cpap machine notes can be faxed to   [phone]

## 2017-05-14 ENCOUNTER — Other Ambulatory Visit: Payer: Self-pay | Admitting: Internal Medicine

## 2017-07-11 ENCOUNTER — Telehealth: Payer: Self-pay

## 2017-07-11 NOTE — Telephone Encounter (Signed)
Copied from CRM [id number]. Topic: General - Other >> Jul 11, 2017 10:36 AM Viviann SpareWhite, Selina wrote: Reason for CRM: Patient is requesting a copy of her lab results done 12/10/17 be mailed to her at [address] [place] [id number] [phone] Judie Petit(M)

## 2017-07-11 NOTE — Telephone Encounter (Signed)
Lab results have been mailed to address provided below.

## 2017-08-13 ENCOUNTER — Other Ambulatory Visit: Payer: Self-pay | Admitting: Internal Medicine

## 2017-11-07 ENCOUNTER — Telehealth: Payer: Self-pay

## 2017-11-07 NOTE — Telephone Encounter (Signed)
Copied from CRM [id number]. Topic: Referral - Question >> Nov 07, 2017 10:07 AM Windy Kalata, NT wrote: Reason for CRM: patient states she was referred to a dermatologist years ago and she would like the doctors name and phone number so that she can give the information to her husband.

## 2017-11-08 NOTE — Telephone Encounter (Signed)
Attempted to call no voicemail. Need to let pt know that we have looked through her chart and don't see where she was referred to a dermatologist so we aren't sure where she went.   PEC may speak with pt.

## 2017-11-11 NOTE — Telephone Encounter (Signed)
mychart message sent

## 2018-01-12 ENCOUNTER — Other Ambulatory Visit: Payer: Self-pay | Admitting: Internal Medicine

## 2018-01-12 DIAGNOSIS — Z1231 Encounter for screening mammogram for malignant neoplasm of breast: Secondary | ICD-10-CM

## 2018-01-16 ENCOUNTER — Ambulatory Visit: Payer: BC Managed Care – PPO | Admitting: Internal Medicine

## 2018-01-16 ENCOUNTER — Encounter: Payer: Self-pay | Admitting: Internal Medicine

## 2018-01-16 VITALS — BP 122/74 | HR 87 | Temp 98.0°F | Resp 16 | Ht 60.0 in | Wt 200.4 lb

## 2018-01-16 DIAGNOSIS — R7303 Prediabetes: Secondary | ICD-10-CM

## 2018-01-16 DIAGNOSIS — I10 Essential (primary) hypertension: Secondary | ICD-10-CM

## 2018-01-16 DIAGNOSIS — Z0000 Encounter for general adult medical examination without abnormal findings: Secondary | ICD-10-CM

## 2018-01-16 DIAGNOSIS — R5383 Other fatigue: Secondary | ICD-10-CM | POA: Diagnosis not present

## 2018-01-16 DIAGNOSIS — N951 Menopausal and female climacteric states: Secondary | ICD-10-CM | POA: Diagnosis not present

## 2018-01-16 DIAGNOSIS — E669 Obesity, unspecified: Secondary | ICD-10-CM | POA: Diagnosis not present

## 2018-01-16 DIAGNOSIS — E7800 Pure hypercholesterolemia, unspecified: Secondary | ICD-10-CM | POA: Diagnosis not present

## 2018-01-16 MED ORDER — ZOSTER VAC RECOMB ADJUVANTED 50 MCG/0.5ML IM SUSR: 0.5000 mL | Freq: Once | INTRAMUSCULAR | 1 refills | Status: AC

## 2018-01-16 MED ORDER — AMLODIPINE BESYLATE 10 MG PO TABS: ORAL_TABLET | ORAL | 3 refills | Status: DC

## 2018-01-16 NOTE — Progress Notes (Signed)
Patient ID: Penny Butler, female    DOB: 1951/05/31  Age: 67 y.o. MRN: [id number]  The patient is here for annual  Preventive  examination and management of other chronic and acute problems.   The risk factors are reflected in the social history.  The roster of all physicians providing medical care to patient - is listed in the Snapshot section of the chart.  Activities of daily living:  The patient is 100% independent in all ADLs: dressing, toileting, feeding as well as independent mobility  Home safety : The patient has smoke detectors in the home. They wear seatbelts.  There are no firearms at home. There is no violence in the home.   There is no risks for hepatitis, STDs or HIV. There is no   history of blood transfusion. They have no travel history to infectious disease endemic areas of the world.  The patient has seen their dentist in the last six month. They have seen their eye doctor in the last year. They admit to slight hearing difficulty with regard to whispered voices and some television programs.  They have deferred audiologic testing in the last year.  They do not  have excessive sun exposure. Discussed the need for sun protection: hats, long sleeves and use of sunscreen if there is significant sun exposure.   Diet: the importance of a healthy diet is discussed. They do have a healthy diet.  The benefits of regular aerobic exercise were discussed. Penny Butler walks 4 times per week ,  20 minutes.   Depression screen: there are no signs or vegative symptoms of depression- irritability, change in appetite, anhedonia, sadness/tearfullness.  Cognitive assessment: the patient manages all their financial and personal affairs and is actively engaged. They could relate day,date,year and events; recalled 2/3 objects at 3 minutes; performed clock-face test normally.  The following portions of the patient's history were reviewed and updated as appropriate: allergies, current medications, past  family history, past medical history,  past surgical history, past social history  and problem list.  Visual acuity was not assessed per patient preference since Penny Butler has regular follow up with her ophthalmologist. Hearing and body mass index were assessed and reviewed.   During the course of the visit the patient was educated and counseled about appropriate screening and preventive services including : fall prevention , diabetes screening, nutrition counseling, colorectal cancer screening, and recommended immunizations.    CC: The primary encounter diagnosis was Pure hypercholesterolemia. Diagnoses of Prediabetes, Fatigue, unspecified type, Morbid obesity (HCC), Menopause syndrome, Obesity (BMI 30-39.9), Essential hypertension, and Encounter for preventive health examination were also pertinent to this visit.   one year follow up on hypertension , obesity and prediabetes. Patient een forced to come in for visit due to refill history of over one year   Hypertension: patient checks blood pressure twice weekly at home.  Readings have been for the most part <130/80 at rest . Patient is following a reduce salt diet most days and is taking medications as prescribed .     Feeling good, but still plagued by symptoms attributed to menopause.  Hot flashes  Not disrupting sleep.   Monthly episodes of feeling bloated, having sugar cravings.  Mood swings. .  NOT  candidate for estrogen due to obesity and FH of DVT/VTE.  Requesting salivary estrogen levels, discouraged from obtaining. Discussed trial of prn spironolactone.   Obesity : no significant change in weight since last visit ,. Discussed referral to Dr Renae Fickle   Last mammogram  January 28 2017,  Currently scheduled for Aug 13 but has been having concerns about  a lump Penny Butler felt in her right inferior axillat at the edge of her breast 10:00 position    History Penny Butler has a past medical history of Hypertension, Obesity, and OSA (obstructive sleep apnea).    Penny Butler has a past surgical history that includes Abdominal hysterectomy and Bilateral oophorectomy (2007).   Her family history includes Breast cancer (age of onset: 59) in her sister.Penny Butler reports that Penny Butler has never smoked. Penny Butler has never used smokeless tobacco. Penny Butler reports that Penny Butler drinks alcohol. Penny Butler reports that Penny Butler does not use drugs.  Outpatient Medications Prior to Visit  Medication Sig Dispense Refill  . amLODipine (NORVASC) 10 MG tablet TAKE 1 TABLET(10 MG) BY MOUTH DAILY 30 tablet 3   No facility-administered medications prior to visit.     Review of Systems  Patient denies headache, fevers, malaise, unintentional weight loss, skin rash, eye pain, sinus congestion and sinus pain, sore throat, dysphagia,  hemoptysis , cough, dyspnea, wheezing, chest pain, palpitations, orthopnea, edema, abdominal pain, nausea, melena, diarrhea, constipation, flank pain, dysuria, hematuria, urinary  Frequency, nocturia, numbness, tingling, seizures,  Focal weakness, Loss of consciousness,  Tremor, insomnia, depression, anxiety, and suicidal ideation.     Objective:  BP 122/74 (BP Location: Left Arm, Patient Position: Sitting, Cuff Size: Large)   Pulse 87   Temp 98 F (36.7 C) (Oral)   Resp 16   Ht 5' (1.524 m)   Wt 200 lb 6.4 oz (90.9 kg)   SpO2 98%   BMI 39.14 kg/m   Physical Exam  General appearance: alert, cooperative and appears stated age Head: Normocephalic, without obvious abnormality, atraumatic Eyes: conjunctivae/corneas clear. PERRL, EOM's intact. Fundi benign. Ears: normal TM's and external ear canals both ears Nose: Nares normal. Septum midline. Mucosa normal. No drainage or sinus tenderness. Throat: lips, mucosa, and tongue normal; teeth and gums normal Neck: no adenopathy, no carotid bruit, no JVD, supple, symmetrical, trachea midline and thyroid not enlarged, symmetric, no tenderness/mass/nodules Lungs: clear to auscultation bilaterally Breasts: normal appearance, no masses  or tenderness Heart: regular rate and rhythm, S1, S2 normal, no murmur, click, rub or gallop Abdomen: soft, non-tender; bowel sounds normal; no masses,  no organomegaly Extremities: extremities normal, atraumatic, no cyanosis or edema Pulses: 2+ and symmetric Skin: Skin color, texture, turgor normal. No rashes or lesions Neurologic: Alert and oriented X 3, normal strength and tone. Normal symmetric reflexes. Normal coordination and gait.     Assessment & Plan:   Problem List Items Addressed This Visit    Obesity (BMI 30-39.9)    Referral to Dr  Renae Fickle.       Menopause syndrome    Her most bothersome symptom is monthly bloating.  Penny Butler is s/p TAH/BSO .  Trial of spironolactone for fluid retention pending evaluation of current renal function and potassium level. .       Hypertension    Well controlled on current regimen. Renal function due  Lab Results  Component Value Date   CREATININE 0.79 12/10/2016   Lab Results  Component Value Date   NA 141 12/10/2016   K 3.8 12/10/2016   CL 106 12/10/2016   CO2 25 12/10/2016         Relevant Medications   amLODipine (NORVASC) 10 MG tablet   Hyperlipidemia - Primary   Relevant Medications   amLODipine (NORVASC) 10 MG tablet   Other Relevant Orders   LDL cholesterol, direct  Encounter for preventive health examination    Annual comprehensive preventive exam was done as well as an evaluation and management of chronic conditions .  During the course of the visit the patient was educated and counseled about appropriate screening and preventive services including :  diabetes screening, lipid analysis with projected  10 year  risk for CAD , nutrition counseling, breast, cervical and colorectal cancer screening, and recommended immunizations.  Printed recommendations for health maintenance sncreenings was give       Other Visit Diagnoses    Prediabetes       Relevant Orders   Hemoglobin A1c   Comprehensive metabolic panel   Fatigue,  unspecified type       Relevant Orders   TSH   Morbid obesity (HCC)       Relevant Orders   Ambulatory referral to Endocrinology      I am having Penny Butler start on Zoster Vaccine Adjuvanted. I am also having her maintain her amLODipine.  Meds ordered this encounter  Medications  . Zoster Vaccine Adjuvanted [facility](SHINGRIX) injection    Sig: Inject 0.5 mLs into the muscle once for 1 dose.    Dispense:  1 each    Refill:  1  . amLODipine (NORVASC) 10 MG tablet    Sig: TAKE 1 TABLET(10 MG) BY MOUTH DAILY    Dispense:  90 tablet    Refill:  3    Medications Discontinued During This Encounter  Medication Reason  . amLODipine (NORVASC) 10 MG tablet Reorder    Follow-up: No follow-ups on file.   Sherlene Shamseresa L Tabytha Gradillas, MD

## 2018-01-16 NOTE — Patient Instructions (Addendum)
We did your physical today!    I did NOT feel any lumps in your breast.  So the mammogram you have scheduled on august 13 is the RIGHT ONE.    I HAVE MADE A REFERRAL T Bhatki Paul.  MD.  She iss the endocrinologist who used to work for [facility] clinic and is now focusing on weight management    I will  call in the diuretic called spironolactone one I see your potassium level from today's labs   Health Maintenance for Postmenopausal Women Menopause is a normal process in which your reproductive ability comes to an end. This process happens gradually over a span of months to years, usually between the ages of 35 and 88. Menopause is complete when you have missed 12 consecutive menstrual periods. It is important to talk with your health care provider about some of the most common conditions that affect postmenopausal women, such as heart disease, cancer, and bone loss (osteoporosis). Adopting a healthy lifestyle and getting preventive care can help to promote your health and wellness. Those actions can also lower your chances of developing some of these common conditions. What should I know about menopause? During menopause, you may experience a number of symptoms, such as:  Moderate-to-severe hot flashes.  Night sweats.  Decrease in sex drive.  Mood swings.  Headaches.  Tiredness.  Irritability.  Memory problems.  Insomnia.  Choosing to treat or not to treat menopausal changes is an individual decision that you make with your health care provider. What should I know about hormone replacement therapy and supplements? Hormone therapy products are effective for treating symptoms that are associated with menopause, such as hot flashes and night sweats. Hormone replacement carries certain risks, especially as you become older. If you are thinking about using estrogen or estrogen with progestin treatments, discuss the benefits and risks with your health care provider. What should I  know about heart disease and stroke? Heart disease, heart attack, and stroke become more likely as you age. This may be due, in part, to the hormonal changes that your body experiences during menopause. These can affect how your body processes dietary fats, triglycerides, and cholesterol. Heart attack and stroke are both medical emergencies. There are many things that you can do to help prevent heart disease and stroke:  Have your blood pressure checked at least every 1-2 years. High blood pressure causes heart disease and increases the risk of stroke.  If you are 79-18 years old, ask your health care provider if you should take aspirin to prevent a heart attack or a stroke.  Do not use any tobacco products, including cigarettes, chewing tobacco, or electronic cigarettes. If you need help quitting, ask your health care provider.  It is important to eat a healthy diet and maintain a healthy weight. ? Be sure to include plenty of vegetables, fruits, low-fat dairy products, and lean protein. ? Avoid eating foods that are high in solid fats, added sugars, or salt (sodium).  Get regular exercise. This is one of the most important things that you can do for your health. ? Try to exercise for at least 150 minutes each week. The type of exercise that you do should increase your heart rate and make you sweat. This is known as moderate-intensity exercise. ? Try to do strengthening exercises at least twice each week. Do these in addition to the moderate-intensity exercise.  Know your numbers.Ask your health care provider to check your cholesterol and your blood glucose. Continue to  have your blood tested as directed by your health care provider.  What should I know about cancer screening? There are several types of cancer. Take the following steps to reduce your risk and to catch any cancer development as early as possible. Breast Cancer  Practice breast self-awareness. ? This means understanding how  your breasts normally appear and feel. ? It also means doing regular breast self-exams. Let your health care provider know about any changes, no matter how small.  If you are 46 or older, have a clinician do a breast exam (clinical breast exam or CBE) every year. Depending on your age, family history, and medical history, it may be recommended that you also have a yearly breast X-ray (mammogram).  If you have a family history of breast cancer, talk with your health care provider about genetic screening.  If you are at high risk for breast cancer, talk with your health care provider about having an MRI and a mammogram every year.  Breast cancer (BRCA) gene test is recommended for women who have family members with BRCA-related cancers. Results of the assessment will determine the need for genetic counseling and BRCA1 and for BRCA2 testing. BRCA-related cancers include these types: ? Breast. This occurs in males or females. ? Ovarian. ? Tubal. This may also be called fallopian tube cancer. ? Cancer of the abdominal or pelvic lining (peritoneal cancer). ? Prostate. ? Pancreatic.  Cervical, Uterine, and Ovarian Cancer Your health care provider may recommend that you be screened regularly for cancer of the pelvic organs. These include your ovaries, uterus, and vagina. This screening involves a pelvic exam, which includes checking for microscopic changes to the surface of your cervix (Pap test).  For women ages 21-65, health care providers may recommend a pelvic exam and a Pap test every three years. For women ages 60-65, they may recommend the Pap test and pelvic exam, combined with testing for human papilloma virus (HPV), every five years. Some types of HPV increase your risk of cervical cancer. Testing for HPV may also be done on women of any age who have unclear Pap test results.  Other health care providers may not recommend any screening for nonpregnant women who are considered low risk for  pelvic cancer and have no symptoms. Ask your health care provider if a screening pelvic exam is right for you.  If you have had past treatment for cervical cancer or a condition that could lead to cancer, you need Pap tests and screening for cancer for at least 20 years after your treatment. If Pap tests have been discontinued for you, your risk factors (such as having a new sexual partner) need to be reassessed to determine if you should start having screenings again. Some women have medical problems that increase the chance of getting cervical cancer. In these cases, your health care provider may recommend that you have screening and Pap tests more often.  If you have a family history of uterine cancer or ovarian cancer, talk with your health care provider about genetic screening.  If you have vaginal bleeding after reaching menopause, tell your health care provider.  There are currently no reliable tests available to screen for ovarian cancer.  Lung Cancer Lung cancer screening is recommended for adults 60-21 years old who are at high risk for lung cancer because of a history of smoking. A yearly low-dose CT scan of the lungs is recommended if you:  Currently smoke.  Have a history of at least 30 pack-years  of smoking and you currently smoke or have quit within the past 15 years. A pack-year is smoking an average of one pack of cigarettes per day for one year.  Yearly screening should:  Continue until it has been 15 years since you quit.  Stop if you develop a health problem that would prevent you from having lung cancer treatment.  Colorectal Cancer  This type of cancer can be detected and can often be prevented.  Routine colorectal cancer screening usually begins at age 13 and continues through age 19.  If you have risk factors for colon cancer, your health care provider may recommend that you be screened at an earlier age.  If you have a family history of colorectal cancer, talk  with your health care provider about genetic screening.  Your health care provider may also recommend using home test kits to check for hidden blood in your stool.  A small camera at the end of a tube can be used to examine your colon directly (sigmoidoscopy or colonoscopy). This is done to check for the earliest forms of colorectal cancer.  Direct examination of the colon should be repeated every 5-10 years until age 36. However, if early forms of precancerous polyps or small growths are found or if you have a family history or genetic risk for colorectal cancer, you may need to be screened more often.  Skin Cancer  Check your skin from head to toe regularly.  Monitor any moles. Be sure to tell your health care provider: ? About any new moles or changes in moles, especially if there is a change in a mole's shape or color. ? If you have a mole that is larger than the size of a pencil eraser.  If any of your family members has a history of skin cancer, especially at a young age, talk with your health care provider about genetic screening.  Always use sunscreen. Apply sunscreen liberally and repeatedly throughout the day.  Whenever you are outside, protect yourself by wearing long sleeves, pants, a wide-brimmed hat, and sunglasses.  What should I know about osteoporosis? Osteoporosis is a condition in which bone destruction happens more quickly than new bone creation. After menopause, you may be at an increased risk for osteoporosis. To help prevent osteoporosis or the bone fractures that can happen because of osteoporosis, the following is recommended:  If you are 85-33 years old, get at least 1,000 mg of calcium and at least 600 mg of vitamin D per day.  If you are older than age 50 but younger than age 61, get at least 1,200 mg of calcium and at least 600 mg of vitamin D per day.  If you are older than age 42, get at least 1,200 mg of calcium and at least 800 mg of vitamin D per  day.  Smoking and excessive alcohol intake increase the risk of osteoporosis. Eat foods that are rich in calcium and vitamin D, and do weight-bearing exercises several times each week as directed by your health care provider. What should I know about how menopause affects my mental health? Depression may occur at any age, but it is more common as you become older. Common symptoms of depression include:  Low or sad mood.  Changes in sleep patterns.  Changes in appetite or eating patterns.  Feeling an overall lack of motivation or enjoyment of activities that you previously enjoyed.  Frequent crying spells.  Talk with your health care provider if you think that you  are experiencing depression. What should I know about immunizations? It is important that you get and maintain your immunizations. These include:  Tetanus, diphtheria, and pertussis (Tdap) booster vaccine.  Influenza every year before the flu season begins.  Pneumonia vaccine.  Shingles vaccine.  Your health care provider may also recommend other immunizations. This information is not intended to replace advice given to you by your health care provider. Make sure you discuss any questions you have with your health care provider. Document Released: 07/30/2005 Document Revised: 12/26/2015 Document Reviewed: 03/11/2015 Elsevier Interactive Patient Education  2018 [employer].

## 2018-01-17 DIAGNOSIS — Z0000 Encounter for general adult medical examination without abnormal findings: Secondary | ICD-10-CM | POA: Insufficient documentation

## 2018-01-17 LAB — COMPREHENSIVE METABOLIC PANEL
ALBUMIN: 4.2 g/dL (ref 3.5–5.2)
ALK PHOS: 83 U/L (ref 39–117)
ALT: 23 U/L (ref 0–35)
AST: 18 U/L (ref 0–37)
BILIRUBIN TOTAL: 0.4 mg/dL (ref 0.2–1.2)
BUN: 24 mg/dL — ABNORMAL HIGH (ref 6–23)
CO2: 27 mEq/L (ref 19–32)
Calcium: 10.2 mg/dL (ref 8.4–10.5)
Chloride: 105 mEq/L (ref 96–112)
Creatinine, Ser: 0.97 mg/dL (ref 0.40–1.20)
GFR: 74.21 mL/min (ref >60.00)
GLUCOSE: 100 mg/dL — AB (ref 70–99)
POTASSIUM: 4 meq/L (ref 3.5–5.1)
SODIUM: 140 meq/L (ref 135–145)
TOTAL PROTEIN: 7.2 g/dL (ref 6.0–8.3)

## 2018-01-17 LAB — TSH: TSH: 2.61 u[IU]/mL (ref 0.35–4.50)

## 2018-01-17 LAB — LDL CHOLESTEROL, DIRECT: Direct LDL: 132 mg/dL

## 2018-01-17 LAB — HEMOGLOBIN A1C: Hgb A1c MFr Bld: 5.9 % (ref 4.6–6.5)

## 2018-01-17 NOTE — Assessment & Plan Note (Signed)
Referral to Dr Paul.

## 2018-01-17 NOTE — Assessment & Plan Note (Signed)
Annual comprehensive preventive exam was done as well as an evaluation and management of chronic conditions .  During the course of the visit the patient was educated and counseled about appropriate screening and preventive services including :  diabetes screening, lipid analysis with projected  10 year  risk for CAD , nutrition counseling, breast, cervical and colorectal cancer screening, and recommended immunizations.  Printed recommendations for health maintenance sncreenings was give

## 2018-01-17 NOTE — Assessment & Plan Note (Signed)
Well controlled on current regimen. Renal function due  Lab Results  Component Value Date   CREATININE 0.79 12/10/2016   Lab Results  Component Value Date   NA 141 12/10/2016   K 3.8 12/10/2016   CL 106 12/10/2016   CO2 25 12/10/2016

## 2018-01-17 NOTE — Assessment & Plan Note (Addendum)
Her most bothersome symptom is monthly bloating.  She is s/p TAH/BSO .  Trial of spironolactone for fluid retention pending evaluation of current renal function and potassium level. .Marland Kitchen

## 2018-01-18 ENCOUNTER — Telehealth: Payer: Self-pay

## 2018-01-18 DIAGNOSIS — E669 Obesity, unspecified: Secondary | ICD-10-CM

## 2018-01-18 NOTE — Telephone Encounter (Signed)
Lab letter mailed. 

## 2018-01-18 NOTE — Telephone Encounter (Signed)
Attempted to call pt back. No answer no voicemail. Please transfer pt to our office.  

## 2018-01-18 NOTE — Telephone Encounter (Signed)
Copied from CRM [id number]. Topic: Quick Communication - Lab Results >> Jan 18, 2018  9:56 AM Crist InfanteHarrald, Kathy J wrote: Pt calling to follow up on labs an and a med (fluid pill) that was to be sent to pharmacy. Pt also wants copy of lab mailed to her home

## 2018-01-18 NOTE — Telephone Encounter (Signed)
Patient is returning Jessica phone call.  

## 2018-01-18 NOTE — Telephone Encounter (Signed)
Attempted to call pt. No answer, no voicemail. Please transfer pt to our office.

## 2018-01-18 NOTE — Telephone Encounter (Signed)
Copied from CRM [id number] [id number]. Topic: Referral - Status >> Jan 18, 2018 10:55 AM Crist InfanteHarrald, Kathy J wrote: Reason for CRM: pt calling to follow up on Endo referral.  Pt unable to get in touch with Dr Renae FicklePaul. I called Dr Renae FicklePaul previous office and [place] your area. Please advise

## 2018-01-18 NOTE — Telephone Encounter (Signed)
Attempted to call pt. No answer, no voicemail. Please transfer to our office.

## 2018-01-23 NOTE — Telephone Encounter (Signed)
Returning call, CB # [phone]

## 2018-01-23 NOTE — Telephone Encounter (Signed)
Patient called office back

## 2018-01-24 NOTE — Telephone Encounter (Signed)
Patient has called office back

## 2018-01-24 NOTE — Telephone Encounter (Signed)
° °  Pt is following up on her request to a endocrinologist  Who also specialize in Weight Management. The previous doctor that she was referred to moved Dr Renae FicklePaul. She said she can go to  [facility] [place] [facility]

## 2018-01-25 NOTE — Telephone Encounter (Signed)
I know of no other endocrinologists who specialize in weight loss.  Dr Dalbert GarnetBeasley is an internist in [place] who specializes,  So I will make that referral to her.

## 2018-01-25 NOTE — Addendum Note (Signed)
Addended by: Sherlene ShamsULLO, Cathlin Buchan L on: 01/25/2018 05:32 PM   Modules accepted: Orders

## 2018-01-25 NOTE — Telephone Encounter (Signed)
Pt called and stated that Dr. Renae FicklePaul has moved and would like to have another referral for endocrinology who specializes in weight loss. Pt stated that she is willing to go to [place].

## 2018-01-26 ENCOUNTER — Telehealth: Payer: Self-pay

## 2018-01-26 MED ORDER — SPIRONOLACTONE 25 MG PO TABS: 25.0000 mg | ORAL_TABLET | Freq: Every day | ORAL | 0 refills | Status: DC

## 2018-01-26 NOTE — Telephone Encounter (Signed)
Spoke with pt and she stated that it was mentioned in her office visit that she may need to start a fluid pill because she retains a lot fluid throughout the day, but that lab work needed to be checked first. The pt is wondering if she needs the fluid pill or not.

## 2018-01-26 NOTE — Telephone Encounter (Signed)
Yes I will prescribe spironolactone,  Which can be taken AS NEEDED , for fluid retention.   In the morning .

## 2018-01-26 NOTE — Telephone Encounter (Signed)
Copied from CRM [id number]. Topic: Quick Communication - See Telephone Encounter >> Jan 26, 2018  2:24 PM Sandy SalaamWoodward, Jessica, CMA wrote: CRM for notification. See Telephone encounter for: 01/26/18.  LMTCB. PEC may speak with pt. >> Jan 26, 2018  2:32 PM Tamela OddiHarris, Brenda J wrote: Patient returned call from [place].  Patient would like a call back.  [phone]

## 2018-01-26 NOTE — Telephone Encounter (Signed)
Spoke with pt to let her know that the referral has been placed for Dr. Dalbert Garn[place]Beasley in [place] and that she should be hearing something from their office in the next week. Explained to pt if she does not hear from them to please give our office a call so we can check on the referral. Pt gave a verbal understanding.

## 2018-01-26 NOTE — Addendum Note (Signed)
Addended by: Sherlene ShamsULLO, TERESA L on: 01/26/2018 01:16 PM   Modules accepted: Orders

## 2018-01-26 NOTE — Addendum Note (Signed)
Addended by: Sherlene ShamsULLO, Milica Gully L on: 01/26/2018 01:12 PM   Modules accepted: Orders

## 2018-01-26 NOTE — Telephone Encounter (Signed)
Attempted to call pt. No answer, no voicemail. PEC may speak with pt if she calls back.

## 2018-01-30 ENCOUNTER — Telehealth: Payer: Self-pay | Admitting: Internal Medicine

## 2018-01-30 NOTE — Telephone Encounter (Signed)
Copied from CRM [id number]. Topic: Quick Communication - Rx Refill/Question >> Jan 30, 2018  9:46 AM Maia Pettiesrtiz, Kristie S wrote: Medication: Progesterone - pt states that Rosanne AshingJim the compound pharmacist at [facility] sent a request for medication last week - Rosanne AshingJim advised pt that she should follow up with the doctor to notify that she is wanting to get this medication so that the order can be sent back - pt states she is estrogen dominant - please advise  Has the patient contacted their pharmacy? Yes  Preferred Pharmacy (with phone number or street name): [facility] - [place], [place] - [address]

## 2018-01-30 NOTE — Telephone Encounter (Signed)
Attempted to call pt. No answer no voicemail. PEC may speak with pt.  

## 2018-01-30 NOTE — Telephone Encounter (Signed)
rx request 

## 2018-01-30 NOTE — Telephone Encounter (Signed)
See previous telephone encounter.

## 2018-01-31 ENCOUNTER — Ambulatory Visit
Admission: RE | Admit: 2018-01-31 | Discharge: 2018-01-31 | Disposition: A | Discharge status: Home / Self Care | Payer: BC Managed Care – PPO | Source: Ambulatory Visit | Attending: Internal Medicine | Admitting: Internal Medicine

## 2018-01-31 DIAGNOSIS — Z1231 Encounter for screening mammogram for malignant neoplasm of breast: Secondary | ICD-10-CM | POA: Insufficient documentation

## 2018-01-31 NOTE — Telephone Encounter (Signed)
Spoke with pt and informed her that Dr. Darrick Huntsmanullo has sent in a fluid pill called spironolactone on an as needed basis. The pt gave a verbal understanding.

## 2018-01-31 NOTE — Telephone Encounter (Signed)
Don't see the progesterone in the pt's current medication list. Don't recall getting the request from [facility] either.

## 2018-01-31 NOTE — Telephone Encounter (Signed)
I HAVE NO IDEA WHAT SHE IS TALKING ABOUT,  AND I DO NOT PRESCRIBE COMPOUNDED HORMONE CREAMS

## 2018-02-01 NOTE — Telephone Encounter (Signed)
Form from [facility] has been placed in red folder.

## 2018-02-01 NOTE — Telephone Encounter (Signed)
rx for progesterone cream signed

## 2018-02-01 NOTE — Telephone Encounter (Signed)
Signed form has been faxed

## 2018-02-22 ENCOUNTER — Ambulatory Visit
Admission: RE | Admit: 2018-02-22 | Discharge: 2018-02-22 | Disposition: A | Discharge status: Home / Self Care | Payer: BC Managed Care – PPO | Source: Ambulatory Visit | Attending: Family | Admitting: Family

## 2018-02-22 ENCOUNTER — Encounter: Payer: Self-pay | Admitting: Family

## 2018-02-22 ENCOUNTER — Ambulatory Visit: Payer: BC Managed Care – PPO | Admitting: Family

## 2018-02-22 ENCOUNTER — Encounter

## 2018-02-22 VITALS — BP 158/90 | HR 80 | Temp 98.7°F | Resp 16 | Wt 204.5 lb

## 2018-02-22 DIAGNOSIS — M545 Low back pain: Secondary | ICD-10-CM | POA: Diagnosis not present

## 2018-02-22 DIAGNOSIS — N139 Obstructive and reflux uropathy, unspecified: Secondary | ICD-10-CM | POA: Diagnosis not present

## 2018-02-22 DIAGNOSIS — N132 Hydronephrosis with renal and ureteral calculous obstruction: Secondary | ICD-10-CM | POA: Insufficient documentation

## 2018-02-22 LAB — POC URINALSYSI DIPSTICK (AUTOMATED)
Bilirubin, UA: NEGATIVE
GLUCOSE UA: NEGATIVE
Ketones, UA: NEGATIVE
NITRITE UA: NEGATIVE
Protein, UA: NEGATIVE
Spec Grav, UA: 1.015 (ref 1.010–1.025)
Urobilinogen, UA: 0.2 E.U./dL
pH, UA: 5.5 (ref 5.0–8.0)

## 2018-02-22 MED ORDER — TAMSULOSIN HCL 0.4 MG PO CAPS: 0.4000 mg | ORAL_CAPSULE | Freq: Every day | ORAL | 0 refills | Status: DC

## 2018-02-22 MED ORDER — HYDROCODONE-ACETAMINOPHEN 5-325 MG PO TABS: 1.0000 | ORAL_TABLET | Freq: Every evening | ORAL | 0 refills | Status: DC | PRN

## 2018-02-22 NOTE — Progress Notes (Signed)
Subjective:    Patient ID: Penny Butler, female    DOB: Sep 04, 1950, 67 y.o.   MRN: [id number]  CC: Penny Butler is a 67 y.o. female who presents today for an acute visit.    HPI: CC: right low back pain x 2 days, unchanged. Intermittent, excruciating when comes on'.  Pain radiates to front rlq. Feels moving.   Endorses N. No vomiting, chillls, fever.Last BM yesterday. Taking  Gas X because thought pain was gas initally. Able to urinate. No dysuria, urinary frequency.   1000mg   tyleonol with temporary relief.   H/o hysterectomy  No h/o renal stone.    Colonoscopy couple of years ago per patient. Unable to see.  HISTORY:  Past Medical History:  Diagnosis Date  . Hypertension   . Obesity   . OSA (obstructive sleep apnea)    now off of CAPAP without symptoms    Past Surgical History:  Procedure Laterality Date  . ABDOMINAL HYSTERECTOMY    . BILATERAL OOPHORECTOMY  2007   [place]   Family History  Problem Relation Age of Onset  . Breast cancer Sister 48    Allergies: Citalopram and Codeine Current Outpatient Medications on File Prior to Visit  Medication Sig Dispense Refill  . amLODipine (NORVASC) 10 MG tablet TAKE 1 TABLET(10 MG) BY MOUTH DAILY 90 tablet 3  . spironolactone (ALDACTONE) 25 MG tablet Take 1 tablet (25 mg total) by mouth daily. As needed for fluid retention 90 tablet 0   No current facility-administered medications on file prior to visit.     Social History   Tobacco Use  . Smoking status: Never Smoker  . Smokeless tobacco: Never Used  Substance Use Topics  . Alcohol use: Yes  . Drug use: No    Review of Systems  Constitutional: Negative for chills and fever.  Respiratory: Negative for cough.   Cardiovascular: Negative for chest pain and palpitations.  Gastrointestinal: Positive for abdominal pain and nausea. Negative for constipation and vomiting.  Genitourinary: Negative for difficulty urinating and pelvic pain.    Musculoskeletal: Positive for back pain.      Objective:    BP (!) 158/90 (BP Location: Left Arm, Patient Position: Sitting, Cuff Size: Large)   Pulse 80   Temp 98.7 F (37.1 C) (Oral)   Resp 16   Wt 204 lb 8 oz (92.8 kg)   SpO2 98%   BMI 39.94 kg/m    Physical Exam  Constitutional: She appears well-developed and well-nourished.  Cardiovascular: Normal rate, regular rhythm, normal heart sounds and normal pulses.  Pulmonary/Chest: Effort normal and breath sounds normal. She has no wheezes. She has no rhonchi. She has no rales.  Abdominal: Soft. Normal appearance and bowel sounds are normal. She exhibits no distension, no fluid wave and no mass. There is no tenderness. There is CVA tenderness. There is no rebound, no guarding, no tenderness at McBurney's point and negative Murphy's sign.  No pain with deep palpation of the abdomen.   Neurological: She is alert.  Skin: Skin is warm and dry.  Psychiatric: She has a normal mood and affect. Her speech is normal and behavior is normal. Thought content normal.  Vitals reviewed.      Assessment & Plan:    1. Acute low back pain, unspecified back pain laterality, with sciatica presence unspecified Patient is well-appearing, she is not septic in appearance. Benign abdominal exam. Exquistite right CVA.   She is clearly in pain however it appears intermittent, respond to  Tylenol. + blood in UA.  Strong suspicion for renal stone.  Pending CT renal at this time.  I went ahead and sent Norco, Flomax to patient's pharmacy as I am concerned that I will get the call after hours and want to ensure patient is able to pick up his prescriptions this evening after CT scan.    I looked up patient on [place] Controlled Substances Reporting System and saw no activity that raised concern of inappropriate use.   Advised patient if pain were to become severe, unmanageable, she would need to go to emergency room.  She verbalized understanding of this.  - POCT  Urinalysis Dipstick (Automated) - CT RENAL STONE STUDY - CBC with Differential/Platelet - Comprehensive metabolic panel  I am having Penny Butler maintain her amLODipine and spironolactone.   No orders of the defined types were placed in this encounter.   Return precautions given.   Risks, benefits, and alternatives of the medications and treatment plan prescribed today were discussed, and patient expressed understanding.   Education regarding symptom management and diagnosis given to patient on AVS.  Continue to follow with Penny Shams, MD for routine health maintenance.   Penny Butler and I agreed with plan.   Rennie Plowman, FNP

## 2018-02-22 NOTE — Patient Instructions (Signed)
May take tyleonol after your Ct scan today  Please note the norco has tyleonol in it and you cannot take more than 4g of tyelonol from ALL sources  Start flomax once we have results of Ct renal and know presence of stone.    Kidney Stones Kidney stones (urolithiasis) are solid, rock-like deposits that form inside of the organs that make urine (kidneys). A kidney stone may form in a kidney and move into the bladder, where it can cause intense pain and block the flow of urine. Kidney stones are created when high levels of certain minerals are found in the urine. They are usually passed through urination, but in some cases, medical treatment may be needed to remove them. What are the causes? Kidney stones may be caused by:  A condition in which certain glands produce too much parathyroid hormone (primary hyperparathyroidism), which causes too much calcium buildup in the blood.  Buildup of uric acid crystals in the bladder (hyperuricosuria). Uric acid is a chemical that the body produces when you eat certain foods. It usually exits the body in the urine.  Narrowing (stricture) of one or both of the tubes that drain urine from the kidneys to the bladder (ureters).  A kidney blockage that is present at birth (congenital obstruction).  Past surgery on the kidney or the ureters, such as gastric bypass surgery.  What increases the risk? The following factors make you more likely to develop kidney stones:  Having had a kidney stone in the past.  Having a family history of kidney stones.  Not drinking enough water.  Eating a diet that is high in protein, salt (sodium), or sugar.  Being overweight or obese.  What are the signs or symptoms? Symptoms of a kidney stone may include:  Nausea.  Vomiting.  Blood in the urine (hematuria).  Pain in the side of the abdomen, right below the ribs (flank pain). Pain usually spreads (radiates) to the groin.  Needing to urinate frequently or  urgently.  How is this diagnosed? This condition may be diagnosed based on:  Your medical history.  A physical exam.  Blood tests.  Urine tests.  CT scan.  Abdominal X-ray.  A procedure to examine the inside of the bladder (cystoscopy).  How is this treated? Treatment for kidney stones depends on the size, location, and makeup of the stones. Treatment may involve:  Analyzing your urine before and after you pass the stone through urination.  Being monitored at the hospital until you pass the stone through urination.  Increasing your fluid intake and decreasing the amount of calcium and protein in your diet.  A procedure to break up kidney stones in the bladder using: ? A focused beam of light (laser therapy). ? Shock waves (extracorporeal shock wave lithotripsy).  Surgery to remove kidney stones. This may be needed if you have severe pain or have stones that block your urinary tract.  Follow these instructions at home: Eating and drinking   Drink enough fluid to keep your urine clear or pale yellow. This will help you to pass the kidney stone.  If directed, change your diet. This may include: ? Limiting how much sodium you eat. ? Eating more fruits and vegetables. ? Limiting how much meat, poultry, fish, and eggs you eat.  Follow instructions from your health care provider about eating or drinking restrictions. General instructions  Collect urine samples as told by your health care provider. You may need to collect a urine sample: ? 24  hours after you pass the stone. ? 8-12 weeks after passing the kidney stone, and every 6-12 months after that.  Strain your urine every time you urinate, for as long as directed. Use the strainer that your health care provider recommends.  Do not throw out the kidney stone after passing it. Keep the stone so it can be tested by your health care provider. Testing the makeup of your kidney stone may help prevent you from getting  kidney stones in the future.  Take over-the-counter and prescription medicines only as told by your health care provider.  Keep all follow-up visits as told by your health care provider. This is important. You may need follow-up X-rays or ultrasounds to make sure that your stone has passed. How is this prevented? To prevent another kidney stone:  Drink enough fluid to keep your urine clear or pale yellow. This is the best way to prevent kidney stones.  Eat a healthy diet and follow recommendations from your health care provider about foods to avoid. You may be instructed to eat a low-protein diet. Recommendations vary depending on the type of kidney stone that you have.  Maintain a healthy weight.  Contact a health care provider if:  You have pain that gets worse or does not get better with medicine. Get help right away if:  You have a fever or chills.  You develop severe pain.  You develop new abdominal pain.  You faint.  You are unable to urinate. This information is not intended to replace advice given to you by your health care provider. Make sure you discuss any questions you have with your health care provider. Document Released: 06/07/2005 Document Revised: 12/26/2015 Document Reviewed: 11/21/2015 Elsevier Interactive Patient Education  [employer].

## 2018-02-23 ENCOUNTER — Telehealth: Payer: Self-pay

## 2018-02-23 DIAGNOSIS — N200 Calculus of kidney: Secondary | ICD-10-CM

## 2018-02-23 DIAGNOSIS — M545 Low back pain: Secondary | ICD-10-CM

## 2018-02-23 LAB — CBC WITH DIFFERENTIAL/PLATELET
BASOS ABS: 0.1 10*3/uL (ref 0.0–0.1)
BASOS PCT: 0.7 % (ref 0.0–3.0)
Eosinophils Absolute: 0.2 10*3/uL (ref 0.0–0.7)
Eosinophils Relative: 1.2 % (ref 0.0–5.0)
HCT: 40.2 % (ref 36.0–46.0)
Hemoglobin: 13.5 g/dL (ref 12.0–15.0)
LYMPHS ABS: 2.8 10*3/uL (ref 0.7–4.0)
Lymphocytes Relative: 22.1 % (ref 12.0–46.0)
MCHC: 33.6 g/dL (ref 30.0–36.0)
MCV: 84.2 fl (ref 78.0–100.0)
MONOS PCT: 9.9 % (ref 3.0–12.0)
Monocytes Absolute: 1.3 10*3/uL — ABNORMAL HIGH (ref 0.1–1.0)
NEUTROS ABS: 8.4 10*3/uL — AB (ref 1.4–7.7)
NEUTROS PCT: 66.1 % (ref 43.0–77.0)
PLATELETS: 302 10*3/uL (ref 150.0–400.0)
RBC: 4.78 Mil/uL (ref 3.87–5.11)
RDW: 13.4 % (ref 11.5–15.5)
WBC: 12.8 10*3/uL — ABNORMAL HIGH (ref 4.0–10.5)

## 2018-02-23 LAB — COMPREHENSIVE METABOLIC PANEL
ALT: 26 U/L (ref 0–35)
AST: 23 U/L (ref 0–37)
Albumin: 4.2 g/dL (ref 3.5–5.2)
Alkaline Phosphatase: 100 U/L (ref 39–117)
BUN: 19 mg/dL (ref 6–23)
CHLORIDE: 103 meq/L (ref 96–112)
CO2: 25 meq/L (ref 19–32)
Calcium: 9.8 mg/dL (ref 8.4–10.5)
Creatinine, Ser: 1.03 mg/dL (ref 0.40–1.20)
GFR: 69.23 mL/min (ref >60.00)
GLUCOSE: 102 mg/dL — AB (ref 70–99)
POTASSIUM: 4 meq/L (ref 3.5–5.1)
Sodium: 137 mEq/L (ref 135–145)
Total Bilirubin: 0.3 mg/dL (ref 0.2–1.2)
Total Protein: 7.3 g/dL (ref 6.0–8.3)

## 2018-02-23 MED ORDER — KETOROLAC TROMETHAMINE 10 MG PO TABS: 10.0000 mg | ORAL_TABLET | Freq: Three times a day (TID) | ORAL | 0 refills | Status: DC | PRN

## 2018-02-23 NOTE — Telephone Encounter (Signed)
Most medications in the narcotic pain med family share similar chemical properties so they can potentially all cause similar side effects  I can offer an anti-inflammatory med for short course to help pain like a oral toradol. I checked her most recently resulted kidney functions and these are good.  Please check with patient that this is OK before I send to pharmacy  Thanks, LG

## 2018-02-23 NOTE — Telephone Encounter (Signed)
Please call patient -- I see an allergy listed to codeine, but the reaction is nausea so that is more of a sensitivity. Please call to find out what her allergy is. If she gets nausea with pain meds, she can take 1/2 tablet at a time so a bigger dose does not upset stomach

## 2018-02-23 NOTE — Telephone Encounter (Signed)
Spoke with patient and she is willing to try medication .

## 2018-02-23 NOTE — Telephone Encounter (Signed)
[facility] [phone] contacted patient is allergic to hydrocodone/acetaminophen 5/325  Please advise.

## 2018-02-23 NOTE — Telephone Encounter (Signed)
Spoke with patient and she states that is a incorrect she has hallicuations. She would like another medication called in .

## 2018-02-24 ENCOUNTER — Telehealth: Payer: Self-pay | Admitting: Family

## 2018-02-24 ENCOUNTER — Other Ambulatory Visit: Payer: Self-pay | Admitting: Family

## 2018-02-24 NOTE — Telephone Encounter (Signed)
Spoke with patient she states she took toradol last night and pain eased up at 10:00pm.  She states she is not in  pain r as of right now.   Again reviewed symptoms that she would need to go to ED for nausea , vomiting fever , not urinating.

## 2018-02-24 NOTE — Telephone Encounter (Signed)
Spoke with Penny Butler at urology whom explained that patient declined earlier appointment due to teaching arrangements  Given options for coming and could work her in based on size of stone.   She gave patient the phone number to call office regarding pain.

## 2018-02-24 NOTE — Telephone Encounter (Signed)
Spoke with patient from home  Advised her regarding right renal stone 87mm  Advised she likely could pass however stones over 51mm can become increasingly hard to pass  Advised fluids  Referral to urology  She will start flomax  Tolerating pain okay at that time  Advised ED if pain worsened or she develeoped fever, N, v

## 2018-02-24 NOTE — Telephone Encounter (Signed)
Penny Butler called me back from urology.   Penny Butler has spoken with Dr Koleen Nimrod , urologist,  regarding WBC 12.8 , which is appropriate for kidney stone of that size.   If 16 or above or running fever, would be concerned.

## 2018-02-24 NOTE — Telephone Encounter (Signed)
Call pt  How is she doing today?  Please ensure she is aware of urology appt 9/19  Again, as I told her, if pain becomes unmanageable or she has increased nausea, vomiting or develops fever, she would need to go to ED

## 2018-02-27 ENCOUNTER — Telehealth: Payer: Self-pay | Admitting: Urology

## 2018-02-27 NOTE — Telephone Encounter (Signed)
Would you call Penny Butler and have her reschedule her appointment for her stone?  She needs follow up to make sure everything has resolved.

## 2018-02-27 NOTE — Telephone Encounter (Signed)
noted 

## 2018-02-28 NOTE — Telephone Encounter (Signed)
She is coming in on the 12th   MB

## 2018-03-02 ENCOUNTER — Telehealth: Payer: Self-pay | Admitting: Internal Medicine

## 2018-03-02 ENCOUNTER — Ambulatory Visit: Payer: Self-pay | Admitting: Urology

## 2018-03-02 NOTE — Telephone Encounter (Signed)
Spoke with Dr. Darrick Huntsmanullo verbally and she stated that if the pt felt okay she could cancel her appt with Urology but that was completely up to the pt. Called the pt to let her know what Dr. Darrick Huntsmanullo had said and pt stated that she would rather follow up with Dr. Darrick Huntsmanullo since she is feeling much better and has not had any pain since last Saturday. Pt has been scheduled for a follow up with Dr. Darrick Huntsmanullo next week. Pt is aware of appt date and time.

## 2018-03-02 NOTE — Telephone Encounter (Signed)
Copied from CRM [id number]. Topic: General - Other >> Mar 02, 2018  9:37 AM Gean BirchwoodWilliams-Neal, Sade R wrote: Pt called in and stated she doesn't feel she needs to go to urology due to passing kidney stone over the weekend. She doesn't understand why she was referred to urology due to Dr Darrick Huntsmanullo not sending her places that wasn't needed. She wants a callback from someone ASAP because her appointment is scheduled for 12pm she states she doesn't have the funds to keep paying because of her insurance.   Cb# [phone]

## 2018-03-02 NOTE — Telephone Encounter (Signed)
Patient requesting call back. °

## 2018-03-03 NOTE — Telephone Encounter (Signed)
error 

## 2018-03-03 NOTE — Telephone Encounter (Signed)
Error

## 2018-03-07 ENCOUNTER — Telehealth: Payer: Self-pay

## 2018-03-07 NOTE — Telephone Encounter (Signed)
Copied from CRM [id number]. Topic: General - Other >> Mar 07, 2018  4:08 PM Percival SpanishKennedy, Cheryl W wrote: She said she would like a call back    Right side pain and back but she passed her kidney stone last Saturday and is asking if maybe there is another stone. She did start back on flomax and tylenol cause it is the exact pain she had before

## 2018-03-08 ENCOUNTER — Ambulatory Visit: Payer: Self-pay | Admitting: Urology

## 2018-03-08 ENCOUNTER — Encounter: Payer: Self-pay | Admitting: Internal Medicine

## 2018-03-08 ENCOUNTER — Ambulatory Visit: Payer: BC Managed Care – PPO | Admitting: Internal Medicine

## 2018-03-08 VITALS — BP 142/80 | HR 76 | Temp 98.7°F | Ht 60.0 in | Wt 204.6 lb

## 2018-03-08 DIAGNOSIS — N201 Calculus of ureter: Secondary | ICD-10-CM

## 2018-03-08 DIAGNOSIS — N200 Calculus of kidney: Secondary | ICD-10-CM

## 2018-03-08 DIAGNOSIS — E669 Obesity, unspecified: Secondary | ICD-10-CM

## 2018-03-08 LAB — POCT URINALYSIS DIPSTICK
Bilirubin, UA: NEGATIVE
Glucose, UA: NEGATIVE
Ketones, UA: NEGATIVE
Leukocytes, UA: NEGATIVE
NITRITE UA: NEGATIVE
PH UA: 5.5 (ref 5.0–8.0)
PROTEIN UA: NEGATIVE
Spec Grav, UA: 1.01 (ref 1.010–1.025)
UROBILINOGEN UA: 0.2 U/dL

## 2018-03-08 LAB — CBC WITH DIFFERENTIAL/PLATELET
BASOS ABS: 0.1 10*3/uL (ref 0.0–0.1)
Basophils Relative: 0.9 % (ref 0.0–3.0)
EOS ABS: 0.1 10*3/uL (ref 0.0–0.7)
Eosinophils Relative: 1.5 % (ref 0.0–5.0)
HCT: 39 % (ref 36.0–46.0)
Hemoglobin: 13.1 g/dL (ref 12.0–15.0)
LYMPHS PCT: 28 % (ref 12.0–46.0)
Lymphs Abs: 2.3 10*3/uL (ref 0.7–4.0)
MCHC: 33.6 g/dL (ref 30.0–36.0)
MCV: 83.9 fl (ref 78.0–100.0)
MONOS PCT: 8.5 % (ref 3.0–12.0)
Monocytes Absolute: 0.7 10*3/uL (ref 0.1–1.0)
NEUTROS PCT: 61.1 % (ref 43.0–77.0)
Neutro Abs: 5 10*3/uL (ref 1.4–7.7)
PLATELETS: 328 10*3/uL (ref 150.0–400.0)
RBC: 4.65 Mil/uL (ref 3.87–5.11)
RDW: 13.1 % (ref 11.5–15.5)
WBC: 8.2 10*3/uL (ref 4.0–10.5)

## 2018-03-08 LAB — BASIC METABOLIC PANEL
BUN: 22 mg/dL (ref 6–23)
CALCIUM: 9.6 mg/dL (ref 8.4–10.5)
CO2: 29 mEq/L (ref 19–32)
CREATININE: 1.08 mg/dL (ref 0.40–1.20)
Chloride: 105 mEq/L (ref 96–112)
GFR: 65.53 mL/min (ref >60.00)
Glucose, Bld: 121 mg/dL — ABNORMAL HIGH (ref 70–99)
Potassium: 3.4 mEq/L — ABNORMAL LOW (ref 3.5–5.1)
SODIUM: 140 meq/L (ref 135–145)

## 2018-03-08 LAB — URINALYSIS, MICROSCOPIC ONLY
RBC / HPF: NONE SEEN (ref 0–?)
WBC, UA: NONE SEEN (ref 0–?)

## 2018-03-08 MED ORDER — PHENTERMINE HCL 37.5 MG PO TABS: 37.5000 mg | ORAL_TABLET | Freq: Every day | ORAL | 0 refills | Status: DC

## 2018-03-08 NOTE — Patient Instructions (Signed)
Continue the Flomax for the next 3-4 days and increase your water intake  If your pain returns we will get the CT scan   I have refilled  phentermine for  2 weeks

## 2018-03-08 NOTE — Progress Notes (Signed)
Pre visit review using our clinic review tool, if applicable. No additional management support is needed unless otherwise documented below in the visit note. 

## 2018-03-08 NOTE — Progress Notes (Signed)
Subjective:  Patient ID: Penny Butler, female    DOB: 07/23/1950  Age: 67 y.o. MRN: [id number]  CC: The primary encounter diagnosis was Nephrolithiasis. Diagnoses of Ureterolithiasis and Obesity (BMI 30-39.9) were also pertinent to this visit.  HPI Penny Butler presents for follow up on  Recent right sided CVA pain. Patient had  seen Claris CheMargaret  On Sept 4 ,  CT was  done on sept 4  Demonstrating a stone in the right ureter Measuring 6 x 5 x 6 mm with right hydroureteronephrosis and perinephric edema.   Was set up to see urology,  But her pain suddenly completely   last Saturday so cancelled the appointment.  Pain returned yesterday  To a lesser degree   Follow up on Fluid retention,  Hypertension, obesity   Outpatient Medications Prior to Visit  Medication Sig Dispense Refill  . amLODipine (NORVASC) 10 MG tablet TAKE 1 TABLET(10 MG) BY MOUTH DAILY 90 tablet 3  . ketorolac (TORADOL) 10 MG tablet Take 1 tablet (10 mg total) by mouth every 8 (eight) hours as needed for moderate pain (Take with food). 15 tablet 0  . spironolactone (ALDACTONE) 25 MG tablet Take 1 tablet (25 mg total) by mouth daily. As needed for fluid retention 90 tablet 0  . tamsulosin (FLOMAX) 0.4 MG CAPS capsule Take 1 capsule (0.4 mg total) by mouth daily. 30 capsule 0   No facility-administered medications prior to visit.     Review of Systems;  Patient denies headache, fevers, malaise, unintentional weight loss, skin rash, eye pain, sinus congestion and sinus pain, sore throat, dysphagia,  hemoptysis , cough, dyspnea, wheezing, chest pain, palpitations, orthopnea, edema, abdominal pain, nausea, melena, diarrhea, constipation, flank pain, dysuria, hematuria, urinary  Frequency, nocturia, numbness, tingling, seizures,  Focal weakness, Loss of consciousness,  Tremor, insomnia, depression, anxiety, and suicidal ideation.      Objective:  BP (!) 142/80   Pulse 76   Temp 98.7 F (37.1 C) (Oral)   Ht 5' (1.524 m)    Wt 204 lb 9.6 oz (92.8 kg)   SpO2 98%   BMI 39.96 kg/m   BP Readings from Last 3 Encounters:  03/08/18 (!) 142/80  02/22/18 (!) 158/90  01/16/18 122/74    Wt Readings from Last 3 Encounters:  03/08/18 204 lb 9.6 oz (92.8 kg)  02/22/18 204 lb 8 oz (92.8 kg)  01/16/18 200 lb 6.4 oz (90.9 kg)    General appearance: alert, cooperative and appears stated age Ears: normal TM's and external ear canals both ears Throat: lips, mucosa, and tongue normal; teeth and gums normal Neck: no adenopathy, no carotid bruit, supple, symmetrical, trachea midline and thyroid not enlarged, symmetric, no tenderness/mass/nodules Back: symmetric, no curvature. ROM normal. No CVA tenderness. Lungs: clear to auscultation bilaterally Heart: regular rate and rhythm, S1, S2 normal, no murmur, click, rub or gallop Abdomen: soft, non-tender; bowel sounds normal; no masses,  no organomegaly Pulses: 2+ and symmetric Skin: Skin color, texture, turgor normal. No rashes or lesions Lymph nodes: Cervical, supraclavicular, and axillary nodes normal.  Lab Results  Component Value Date   HGBA1C 5.9 01/16/2018   HGBA1C 5.7 12/10/2016   HGBA1C 5.9 10/24/2015    Lab Results  Component Value Date   CREATININE 1.08 03/08/2018   CREATININE 1.03 02/22/2018   CREATININE 0.97 01/16/2018    Lab Results  Component Value Date   WBC 8.2 03/08/2018   HGB 13.1 03/08/2018   HCT 39.0 03/08/2018   PLT 328.0 03/08/2018  GLUCOSE 121 (H) 03/08/2018   CHOL 219 (H) 12/10/2016   TRIG 91.0 12/10/2016   HDL 44.10 12/10/2016   LDLDIRECT 132.0 01/16/2018   LDLCALC 157 (H) 12/10/2016   ALT 26 02/22/2018   AST 23 02/22/2018   NA 140 03/08/2018   K 3.4 (L) 03/08/2018   CL 105 03/08/2018   CREATININE 1.08 03/08/2018   BUN 22 03/08/2018   CO2 29 03/08/2018   TSH 2.61 01/16/2018   HGBA1C 5.9 01/16/2018   MICROALBUR 1.6 12/10/2016    Ct Renal Stone Study  Result Date: 02/22/2018 CLINICAL DATA:  Hematuria and right CVA  tenderness. EXAM: CT ABDOMEN AND PELVIS WITHOUT CONTRAST TECHNIQUE: Multidetector CT imaging of the abdomen and pelvis was performed following the standard protocol without IV contrast. COMPARISON:  None. FINDINGS: LOWER CHEST: There is no basilar pleural or apical pericardial effusion. HEPATOBILIARY: The hepatic contours and density are normal. There is no intra- or extrahepatic biliary dilatation. The gallbladder is normal. PANCREAS: The pancreatic parenchymal contours are normal and there is no ductal dilatation. There is no peripancreatic fluid collection. SPLEEN: Normal. ADRENALS/URINARY TRACT: --Adrenal glands: Normal. --Right kidney/ureter: There is an obstructing stone within the midportion of the right ureter, measuring 6 x 5 x 6 mm. There is moderate hydroureteronephrosis and mild perinephric edema. No other nephroureterolithiasis. --Left kidney/ureter: No hydronephrosis, nephroureterolithiasis, perinephric stranding or solid renal mass. --Urinary bladder: Normal for degree of distention STOMACH/BOWEL: --Stomach/Duodenum: There is no hiatal hernia or other gastric abnormality. The duodenal course and caliber are normal. --Small bowel: No dilatation or inflammation. --Colon: No focal abnormality. --Appendix: Normal. VASCULAR/LYMPHATIC: Normal course and caliber of the major abdominal vessels. No abdominal or pelvic lymphadenopathy. REPRODUCTIVE: Status post hysterectomy. No adnexal mass. MUSCULOSKELETAL. No bony spinal canal stenosis or focal osseous abnormality. OTHER: None. IMPRESSION: Right-sided obstructive uropathy secondary to 6 x 5 x 6 mm mid ureteral stone causing moderate hydroureteronephrosis. No other nephroureterolithiasis. Electronically Signed   By: Deatra Robinson M.D.   On: 02/22/2018 16:45    Assessment & Plan:   Problem List Items Addressed This Visit    Obesity (BMI 30-39.9)    She has had difficulty losing weight due to increased appetite and is requesting a trial of  Phentermine.   For two weeks.  She is aware of the possible side effects and risks and understands that    The medication will not be refilled if she has not lost 5% of her body weight over the next 3 months      Relevant Medications   phentermine (ADIPEX-P) 37.5 MG tablet   Ureterolithiasis    Noted on CT sept 4 for CVA pain workup. Her pain spontaneously resolved,  But returned yesterday.  She has no signs of infection or hematuria today.  Advised to continue taking  Flomax for 3 days.  If pain retursn we will repeat the CT        Other Visit Diagnoses    Nephrolithiasis    -  Primary   Relevant Orders   POCT urinalysis dipstick (Completed)   Urine Microscopic Only (Completed)   Urine Culture (Completed)   Basic metabolic panel (Completed)   CBC with Differential/Platelet (Completed)    A total of 25 minutes was spent with patient more than half of which was spent in counseling patient on the above mentioned issues , reviewing and explaining recent labs and imaging studies done, and coordination of care.   I am having Vikki Ports I. Freels start on phentermine. I am also  having her maintain her amLODipine, spironolactone, tamsulosin, and ketorolac.  Meds ordered this encounter  Medications  . phentermine (ADIPEX-P) 37.5 MG tablet    Sig: Take 1 tablet (37.5 mg total) by mouth daily before breakfast.    Dispense:  14 tablet    Refill:  0    There are no discontinued medications.  Follow-up: No follow-ups on file.   Sherlene Shams, MD

## 2018-03-09 LAB — URINE CULTURE
MICRO NUMBER:: 91120094
RESULT: NO GROWTH
SPECIMEN QUALITY:: ADEQUATE

## 2018-03-09 NOTE — Telephone Encounter (Signed)
Pt called stating that the right side and back pain side returned. She stated that it is the same pain as the she had before she passed a stone on Saturday. Pt is wondering if there could be another stone? Pt stated that she started back on the flomax and tylenol. Pt had a referral placed for urology and canceled the appointment because she was feeling better and didn't think she needed it, she jsut wanted to follow up with Dr. Darrick Huntsmanullo.

## 2018-03-11 DIAGNOSIS — Z87442 Personal history of urinary calculi: Secondary | ICD-10-CM | POA: Insufficient documentation

## 2018-03-11 NOTE — Assessment & Plan Note (Signed)
She has had difficulty losing weight due to increased appetite and is requesting a trial of  Phentermine.  For two weeks.  She is aware of the possible side effects and risks and understands that    The medication will not be refilled if she has not lost 5% of her body weight over the next 3 months

## 2018-03-11 NOTE — Assessment & Plan Note (Addendum)
Noted on CT sept 4 for CVA pain workup. Her pain spontaneously resolved,  But returned yesterday.  She has no signs of infection or hematuria today.  Advised to continue taking  Flomax for 3 days.  If pain retursn we will repeat the CT

## 2018-06-12 ENCOUNTER — Telehealth: Payer: Self-pay | Admitting: *Deleted

## 2018-06-12 MED ORDER — PHENTERMINE HCL 37.5 MG PO TABS: 37.5000 mg | ORAL_TABLET | Freq: Every day | ORAL | 0 refills | Status: DC

## 2018-06-12 NOTE — Telephone Encounter (Signed)
Copied from CRM [id number]. Topic: General - Other >> Jun 12, 2018 12:16 PM Percival SpanishKennedy, Cheryl W wrote:  Pt call to ask if Dr Darrick Huntsmanullo will please RX her a 2 week supply of phentermine. She said please please she only takes 1/2 pill. Would like a call back    [facility] [address] [place] 

## 2018-06-12 NOTE — Telephone Encounter (Signed)
Patient notified

## 2018-06-12 NOTE — Telephone Encounter (Signed)
Last filled 9/19 14 tablets patient takes 1/2 tab daily, last OV 03/08/18. Patient says can wait til PCP in office.

## 2018-06-12 NOTE — Telephone Encounter (Signed)
I have refilled it for two weeks

## 2018-09-27 ENCOUNTER — Telehealth: Payer: Self-pay | Admitting: Internal Medicine

## 2018-09-27 ENCOUNTER — Telehealth: Payer: Self-pay

## 2018-09-27 DIAGNOSIS — G4733 Obstructive sleep apnea (adult) (pediatric): Secondary | ICD-10-CM

## 2018-09-27 NOTE — Telephone Encounter (Signed)
Forms have been faxed 

## 2018-09-27 NOTE — Telephone Encounter (Signed)
Pt called back and confirmed that she received CPAP from [facility] but it is a loaner for only 1 month.    Pt said that she has new insurance, [employer] and [employer].  Pt said that the insurance company requires her to have a face-to-face appt w/ PCP in order to get her own machine.  Pt said she was told that the appt can be a virtual face time visit as long as Dr. Darrick Huntsman documents in notes.    Pt scheduled a DoxyMe virtual visit on 10/03/18 @ 2:30 pm.

## 2018-09-27 NOTE — Telephone Encounter (Addendum)
Pt calling back from [employer]. [facility] got on the phone and states the letter must state  "order for a cpap with the setting of 10"  Pt has been there for a while waiting on this, she lives in [place] and their office in [place].   Also, pt has new insurance, and the lady from [employer] states they may require a face to face, or new sleep study.  If they do, they can give pt a loaner until this goes through.   Pt would like a call back asap

## 2018-09-27 NOTE — Telephone Encounter (Signed)
Letter drafted and printed.

## 2018-09-27 NOTE — Telephone Encounter (Signed)
Copied from CRM [id number]. Topic: General - Other >> Sep 27, 2018 10:11 AM Percival Spanish wrote:  Pt call to say her cpap machine is not working and she contacted [facility] and was told they need a note from Dr Darrick Huntsman stating why she need and that she benefits from the machine. Pt said she has not been able to sleep the pass couple of nights and ask that this req be sent asap    Fax number [phone]   Attn French Ana

## 2018-09-27 NOTE — Addendum Note (Signed)
Addended by: Sandy Salaam on: 09/27/2018 02:57 PM   Modules accepted: Orders

## 2018-09-27 NOTE — Telephone Encounter (Signed)
Send letter to [employer]

## 2018-09-27 NOTE — Telephone Encounter (Signed)
Pt calling back from [employer]. [employer] got on the phone and states the letter must state  °"order for a cpap with the setting of 10" ° °Pt has been there for a while waiting on this, she lives in [place] and their office in [place]. ° ° °Also, pt has new insurance, and the lady from [employer] states they may require a face to face, or new sleep study.  If they do, they can give pt a loaner until this goes through.  ° °Pt would like a call back asap °

## 2018-09-27 NOTE — Telephone Encounter (Signed)
Copied from CRM [id number]. Topic: General - Other >> Sep 27, 2018 10:11 AM Percival Spanish wrote:  Pt call to say her cpap machine is not working and she contacted [facility] and was told they need a note from Dr Darrick Huntsman stating why she need and that she benefits from the machine. Pt said she has not been able to sleep the pass couple of nights and ask that this req be sent asap    Fax number [phone]   Attn Tracy >> Sep 27, 2018 10:20 AM Percival Spanish wrote:    Pt ask that someone call her when the fax had been sent to [facility]

## 2018-09-28 NOTE — Telephone Encounter (Signed)
FYI

## 2018-10-03 ENCOUNTER — Ambulatory Visit (INDEPENDENT_AMBULATORY_CARE_PROVIDER_SITE_OTHER): Payer: Medicare HMO | Admitting: Internal Medicine

## 2018-10-03 DIAGNOSIS — I10 Essential (primary) hypertension: Secondary | ICD-10-CM | POA: Diagnosis not present

## 2018-10-03 DIAGNOSIS — G4733 Obstructive sleep apnea (adult) (pediatric): Secondary | ICD-10-CM

## 2018-10-03 DIAGNOSIS — Z87442 Personal history of urinary calculi: Secondary | ICD-10-CM | POA: Diagnosis not present

## 2018-10-03 DIAGNOSIS — E7800 Pure hypercholesterolemia, unspecified: Secondary | ICD-10-CM | POA: Diagnosis not present

## 2018-10-03 NOTE — Assessment & Plan Note (Signed)
Diagnosed by sleep study in 2007. She has been wearing her CPAP every night a minimum of 6 hours per night and notes improved daytime wakefulness and decreased fatigue

## 2018-10-03 NOTE — Assessment & Plan Note (Signed)
Managed with amlodipine 10 mg daily.  Advised patient to purchase a home machine to monitor BP  ,  Goal 130/80 or less

## 2018-10-03 NOTE — Assessment & Plan Note (Signed)
No recurrence since September 2019 episode.  She is maintaining good hydration

## 2018-10-03 NOTE — Progress Notes (Signed)
Virtual Visit via [email or web address]  This visit type was conducted due to national recommendations for restrictions regarding the COVID-19 pandemic (e.g. social distancing).  This format is felt to be most appropriate for this patient at this time.  All issues noted in this document were discussed and addressed.  No physical exam was performed (except for noted visual exam findings with Video Visits).   I connected with@ on 10/03/18 at  2:30 PM EDT by a video enabled telemedicine application or telephone and verified that I am speaking with the correct person using two identifiers. Location patient: home Location provider: work or home office Persons participating in the virtual visit: patient, provider  I discussed the limitations, risks, security and privacy concerns of performing an evaluation and management service by telephone and the availability of in person appointments. I also discussed with the patient that there may be a patient responsible charge related to this service. The patient expressed understanding and agreed to proceed.   Reason for visit:  Sleep apnea follow up HPI: 68 yr old female with history of OSA diagnosed in 2007.  Has been using the same CPAP machine for the past 5 years without trouble,  On a Setting of 10 cm H20.  Mask recently stopped working,  Was provided a Industrial/product designerloaner machine rom [facility] because she had not had her compliance documented in any recent visits.  Patient states that she has been CPAP dependent  For at least 10 years,  She wears her CPAP every night, for a minimum of  6 hours . She  Travels with it as well.  Her daytime symptoms of sleepiness and mrning headaches have resolved   .Patient is taking amlodipine as prescribed and notes no adverse effects.  Home BP readings have not been done   She is avoiding added salt in her diet and walking regularly about 3 times per week for exercise .  Obesity:  Reviewed her previous success at weight loss,  Her goal weight for  BMI < 30 (152 lbs ).  She has a careful diet most days but has had difficulty maintaining  a consistent exercise schedule in spite of being out of work.     ROS: See pertinent positives and negatives per HPI.  Past Medical History:  Diagnosis Date  . Hypertension   . Obesity   . OSA (obstructive sleep apnea)    now off of CAPAP without symptoms     Past Surgical History:  Procedure Laterality Date  . ABDOMINAL HYSTERECTOMY    . BILATERAL OOPHORECTOMY  2007   [place]    Family History  Problem Relation Age of Onset  . 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  amLODipine (NORVASC) 10 MG tablet, TAKE 1 TABLET(10 MG) BY MOUTH DAILY, Disp: 90 tablet, Rfl: 3  EXAM:  VITALS per patient if applicable:  GENERAL: alert, oriented, appears well and in no acute distress  HEENT: atraumatic, conjunttiva clear, no obvious abnormalities on inspection of external nose and ears  NECK: normal movements of the head and neck  LUNGS: on inspection no signs of respiratory distress, breathing rate appears normal, no obvious gross SOB, gasping or wheezing  CV: no obvious cyanosis  MS: moves all visible extremities without noticeable abnormality  PSYCH/NEURO: pleasant and cooperative, no obvious depression or anxiety, speech and thought processing grossly intact  ASSESSMENT AND PLAN:  Discussed the following assessment and plan:  OSA (obstructive sleep apnea) Diagnosed by sleep study in 2007. She has been wearing her CPAP every night a minimum of 6 hours per night and notes improved daytime wakefulness and decreased fatigue   Personal history of kidney stones No recurrence since September 2019 episode.  She is maintaining good hydration  Hypertension Managed with amlodipine 10 mg daily.  Advised patient to purchase a home machine to monitor BP  ,  Goal 130/80 or less     Updated Medication List Outpatient Encounter Medications as of 10/03/2018  Medication Sig  . amLODipine (NORVASC) 10 MG tablet TAKE 1 TABLET(10 MG) BY MOUTH DAILY  . [DISCONTINUED] ketorolac (TORADOL) 10 MG tablet Take 1 tablet (10 mg total) by mouth every 8 (eight) hours as needed for moderate pain (Take with food).  . [DISCONTINUED] phentermine (ADIPEX-P) 37.5 MG tablet Take 1 tablet (37.5 mg total) by mouth daily before breakfast.  . [DISCONTINUED] spironolactone (ALDACTONE) 25 MG tablet Take 1 tablet (25 mg total) by mouth daily. As needed for fluid retention (Patient not taking: Reported on 10/03/2018)  . [DISCONTINUED] tamsulosin (FLOMAX) 0.4 MG CAPS capsule Take 1 capsule (0.4 mg total) by mouth daily.   No facility-administered encounter medications on file as of 10/03/2018.     .I discussed the assessment and treatment plan with the patient. The patient was provided an opportunity to ask questions and all were answered. The patient agreed with the plan and demonstrated an understanding of the instructions.   The patient was advised to call back or seek an in-person evaluation if the symptoms worsen or if the condition fails to improve as anticipated.  I provided 25 minutes of non-face-to-face time during this encounter.   Sherlene Shams, MD

## 2018-10-16 DIAGNOSIS — I10 Essential (primary) hypertension: Secondary | ICD-10-CM | POA: Diagnosis not present

## 2018-10-16 DIAGNOSIS — G4733 Obstructive sleep apnea (adult) (pediatric): Secondary | ICD-10-CM | POA: Diagnosis not present

## 2018-11-06 ENCOUNTER — Other Ambulatory Visit: Payer: Self-pay

## 2018-11-06 ENCOUNTER — Encounter: Payer: Self-pay | Admitting: Internal Medicine

## 2018-11-06 ENCOUNTER — Ambulatory Visit (INDEPENDENT_AMBULATORY_CARE_PROVIDER_SITE_OTHER): Payer: Medicare HMO | Admitting: Internal Medicine

## 2018-11-06 DIAGNOSIS — G4762 Sleep related leg cramps: Secondary | ICD-10-CM

## 2018-11-06 DIAGNOSIS — G4733 Obstructive sleep apnea (adult) (pediatric): Secondary | ICD-10-CM | POA: Diagnosis not present

## 2018-11-06 DIAGNOSIS — R7303 Prediabetes: Secondary | ICD-10-CM

## 2018-11-06 DIAGNOSIS — E669 Obesity, unspecified: Secondary | ICD-10-CM | POA: Diagnosis not present

## 2018-11-06 NOTE — Progress Notes (Signed)
Virtual Visit via [email or web address]  This visit type was conducted due to national recommendations for restrictions regarding the COVID-19 pandemic (e.g. social distancing).  This format is felt to be most appropriate for this patient at this time.  All issues noted in this document were discussed and addressed.  No physical exam was performed (except for noted visual exam findings with Video Visits).   I connected with@ on 11/06/18 at 10:30 AM EDT by a video enabled telemedicine application or telephone and verified that I am speaking with the correct person using two identifiers. Location patient: home Location provider: work or home office Persons participating in the virtual visit: patient, provider  I discussed the limitations, risks, security and privacy concerns of performing an evaluation and management service by telephone and the availability of in person appointments. I also discussed with the patient that there may be a patient responsible charge related to this service. The patient expressed understanding and agreed to proceed.  Reason for visit: follow up on OSA with CPAP, obesity hypertension, and hyperlipidemia   HPI:    68 yr old recently received new CPAP machine for management of OSA diagnosed in  2007.  She reports that she is sleeping well; she is averaging 8  hours per night using the new machine.  She notes improved wakefulness ,  increased daytime energy and resolution of daytime hypersomnolence.   Hypertension:  Patient is following a reduced salt diet most days and is taking medications as prescribed.  Some leg cramps occurring at night.    ROS: See pertinent positives and negatives per HPI.  Past Medical History:  Diagnosis Date  . Hypertension   . Obesity   . OSA (obstructive sleep apnea)    now off of CAPAP without symptoms     Past Surgical History:  Procedure Laterality Date  . ABDOMINAL HYSTERECTOMY    . BILATERAL OOPHORECTOMY  2007   [place]    Family  History  Problem Relation Age of Onset  . Breast cancer Sister [id number]    SOCIAL HX: married   IADL    Current Outpatient Medications:  .  amLODipine (NORVASC) 10 MG tablet, TAKE 1 TABLET(10 MG) BY MOUTH DAILY, Disp: 90 tablet, Rfl: 3  EXAM:  VITALS per patient if applicable:  GENERAL: alert, oriented, appears well and in no acute distress  HEENT: atraumatic, conjunttiva clear, no obvious abnormalities on inspection of external nose and ears  NECK: normal movements of the head and neck  LUNGS: on inspection no signs of respiratory distress, breathing rate appears normal, no obvious gross SOB, gasping or wheezing  CV: no obvious cyanosis  MS: moves all visible extremities without noticeable abnormality  PSYCH/NEURO: pleasant and cooperative, no obvious depression or anxiety, speech and thought processing grossly intact  ASSESSMENT AND PLAN:  Discussed the following assessment and plan:  Nocturnal leg cramps - Plan: Magnesium, Comprehensive metabolic panel, TSH  Prediabetes - Plan: Lipid panel, Hemoglobin A1c  Obesity (BMI 30-39.9)  OSA (obstructive sleep apnea)  Obesity (BMI 30-39.9) I have addressed  BMI and recommended a low glycemic index diet utilizing smaller more frequent meals to increase metabolism.  I have also recommended that patient start exercising with a goal of 30 minutes of aerobic exercise a minimum of 5 days per week. Screening for lipid disorders, thyroid and diabetes has been done .    Nocturnal leg cramps Checking electrolytes and thyroid function   OSA (obstructive sleep apnea) Diagnosed by sleep study in 2007. She has been  wearing her new  CPAP mask every night an average of 8 hours per night and notes improved daytime wakefulness and decreased fatigue     I discussed the assessment and treatment plan with the patient. The patient was provided an opportunity to ask questions and all were answered. The patient agreed with the plan and demonstrated  an understanding of the instructions.   The patient was advised to call back or seek an in-person evaluation if the symptoms worsen or if the condition fails to improve as anticipated.  I provided 25 minutes of non-face-to-face time during this encounter.   Sherlene Shams, MD

## 2018-11-06 NOTE — Patient Instructions (Signed)
I'm glad you are feeling more rested with your new CPAP machine  Nocturnal leg cramps may be due to low sodium .  You can try an ounce of pickle juice or tonic water in the evening   I will have Shanda Bumps call to set up a fasting lab appt    The new goals for optimal blood pressure management are 120/70.  Please check your blood pressure a few times at home and send me the readings so I can determine if you need a change in medication

## 2018-11-07 DIAGNOSIS — G4762 Sleep related leg cramps: Secondary | ICD-10-CM | POA: Insufficient documentation

## 2018-11-07 NOTE — Assessment & Plan Note (Signed)
I have addressed  BMI and recommended a low glycemic index diet utilizing smaller more frequent meals to increase metabolism.  I have also recommended that patient start exercising with a goal of 30 minutes of aerobic exercise a minimum of 5 days per week. Screening for lipid disorders, thyroid and diabetes has  been done  °

## 2018-11-07 NOTE — Assessment & Plan Note (Signed)
Checking electrolytes and thyroid function

## 2018-11-07 NOTE — Assessment & Plan Note (Signed)
Diagnosed by sleep study in 2007. She has been wearing her new  CPAP mask every night an average of 8 hours per night and notes improved daytime wakefulness and decreased fatigue

## 2018-11-10 NOTE — Progress Notes (Signed)
Fasting lab appt is scheduled and pt is aware.

## 2018-11-15 DIAGNOSIS — I10 Essential (primary) hypertension: Secondary | ICD-10-CM | POA: Diagnosis not present

## 2018-11-15 DIAGNOSIS — G4733 Obstructive sleep apnea (adult) (pediatric): Secondary | ICD-10-CM | POA: Diagnosis not present

## 2018-11-22 ENCOUNTER — Telehealth: Payer: Self-pay | Admitting: *Deleted

## 2018-11-22 ENCOUNTER — Other Ambulatory Visit (INDEPENDENT_AMBULATORY_CARE_PROVIDER_SITE_OTHER): Payer: Medicare HMO

## 2018-11-22 ENCOUNTER — Other Ambulatory Visit: Payer: Self-pay

## 2018-11-22 DIAGNOSIS — G4762 Sleep related leg cramps: Secondary | ICD-10-CM

## 2018-11-22 DIAGNOSIS — R7303 Prediabetes: Secondary | ICD-10-CM | POA: Diagnosis not present

## 2018-11-22 LAB — COMPREHENSIVE METABOLIC PANEL
ALT: 18 U/L (ref 0–35)
AST: 13 U/L (ref 0–37)
Albumin: 4 g/dL (ref 3.5–5.2)
Alkaline Phosphatase: 91 U/L (ref 39–117)
BUN: 17 mg/dL (ref 6–23)
CO2: 22 mEq/L (ref 19–32)
Calcium: 9.8 mg/dL (ref 8.4–10.5)
Chloride: 100 mEq/L (ref 96–112)
Creatinine, Ser: 0.78 mg/dL (ref 0.40–1.20)
GFR: 89.56 mL/min (ref >60.00)
Glucose, Bld: 363 mg/dL — ABNORMAL HIGH (ref 70–99)
Potassium: 4.2 mEq/L (ref 3.5–5.1)
Sodium: 134 mEq/L — ABNORMAL LOW (ref 135–145)
Total Bilirubin: 0.4 mg/dL (ref 0.2–1.2)
Total Protein: 6.6 g/dL (ref 6.0–8.3)

## 2018-11-22 LAB — MAGNESIUM: Magnesium: 1.8 mg/dL (ref 1.5–2.5)

## 2018-11-22 LAB — LIPID PANEL
Cholesterol: 215 mg/dL — ABNORMAL HIGH (ref 0–200)
HDL: 34.1 mg/dL — ABNORMAL LOW (ref >39.00)
Total CHOL/HDL Ratio: 6
Triglycerides: 448 mg/dL — ABNORMAL HIGH (ref 0.0–149.0)

## 2018-11-22 LAB — LDL CHOLESTEROL, DIRECT: Direct LDL: 66 mg/dL

## 2018-11-22 LAB — TSH: TSH: 1.87 u[IU]/mL (ref 0.35–4.50)

## 2018-11-22 LAB — HEMOGLOBIN A1C: Hgb A1c MFr Bld: 16.1 % — ABNORMAL HIGH (ref 4.6–6.5)

## 2018-11-22 NOTE — Telephone Encounter (Signed)
Copied from CRM [id number]. Topic: General - Inquiry >> Nov 22, 2018  9:35 AM Reggie Pile, NT wrote: Reason for CRM: Patient is wondering if Dr.Tullo will accept her son, Rolla Plate and Lenell Antu grandson as new patients. Call back is [phone].

## 2018-11-23 ENCOUNTER — Encounter: Payer: Self-pay | Admitting: Internal Medicine

## 2018-11-23 DIAGNOSIS — E119 Type 2 diabetes mellitus without complications: Secondary | ICD-10-CM | POA: Insufficient documentation

## 2018-11-23 DIAGNOSIS — Z7985 Long-term (current) use of injectable non-insulin antidiabetic drugs: Secondary | ICD-10-CM | POA: Insufficient documentation

## 2018-11-23 DIAGNOSIS — I152 Hypertension secondary to endocrine disorders: Secondary | ICD-10-CM | POA: Insufficient documentation

## 2018-11-23 DIAGNOSIS — E1159 Type 2 diabetes mellitus with other circulatory complications: Secondary | ICD-10-CM | POA: Insufficient documentation

## 2018-11-23 DIAGNOSIS — E669 Obesity, unspecified: Secondary | ICD-10-CM | POA: Insufficient documentation

## 2018-11-23 NOTE — Telephone Encounter (Signed)
Penny Butler called back to check the status of Dr. Darrick Huntsman excepting the new Pt's. [name] really wants to get in to see Dr. Darrick Huntsman as well about his possible kidney issues

## 2018-11-24 ENCOUNTER — Telehealth: Payer: Self-pay | Admitting: Internal Medicine

## 2018-11-24 NOTE — Telephone Encounter (Signed)
IF THE GRANDSON IS OVER THE AGE OF 17 I WILL ACCEPT BOTH

## 2018-11-24 NOTE — Telephone Encounter (Signed)
If her grandson is over 77 I will accept both

## 2018-11-24 NOTE — Telephone Encounter (Signed)
Pt is wanting to know if you would accept her son and grandson as new pts?

## 2018-11-24 NOTE — Telephone Encounter (Signed)
Pt called back about Dr Darrick Huntsman taking pt grandson and son as NP. Please advise?  Call pt @ [phone]. Thank you!  No Vm set up

## 2018-11-24 NOTE — Telephone Encounter (Signed)
Dr. Darrick Huntsman has accepted both as new pt's. Will you call to schedule new pt appointments.   Penny Butler- [phone]  Penny Butler- [phone]

## 2018-11-27 NOTE — Telephone Encounter (Signed)
Patient is calling in to check on status of new patient for her grandson and son. Please advise and call family members ASAP

## 2018-11-30 ENCOUNTER — Telehealth: Payer: Self-pay

## 2018-11-30 MED ORDER — BLOOD GLUCOSE METER KIT: PACK | 0 refills | Status: DC

## 2018-11-30 NOTE — Telephone Encounter (Signed)
Glucometer has been sent in per Dr. Derrel Nip. Pt is aware it has been sent in.

## 2018-11-30 NOTE — Telephone Encounter (Signed)
-----   Message from Crecencio Mc, MD sent at 11/28/2018  3:13 PM EDT ----- Yes.  Her husband is a diabetic,  If he cannot show her how to do it, she will need a nurse visit

## 2018-12-01 ENCOUNTER — Telehealth: Payer: Self-pay

## 2018-12-01 DIAGNOSIS — R69 Illness, unspecified: Secondary | ICD-10-CM | POA: Diagnosis not present

## 2018-12-01 MED ORDER — METFORMIN HCL 500 MG PO TABS: 500.0000 mg | ORAL_TABLET | Freq: Two times a day (BID) | ORAL | 3 refills | Status: DC

## 2018-12-01 NOTE — Telephone Encounter (Signed)
Spoke with pt and she stated that she just picked up the metformin and the glucometer from the pharmacy. Pt stated that the metformin that she had was the XR but that she has already gotten rid of it. Pt stated that she will start checking her sugars over the weekend.

## 2018-12-01 NOTE — Addendum Note (Signed)
Addended by: Crecencio Mc on: 12/01/2018 11:53 AM   Modules accepted: Orders

## 2018-12-01 NOTE — Telephone Encounter (Signed)
  Copied from [place] [id number]. Topic: General - Other >> Nov 29, 2018  9:47 AM Burchel, Abbi R wrote: Reason for CRM:  Pt requesting call back to discuss lab results, Metformin rx  and blood sugar monitor.  Please call pt: [phone]   Patient states someone called her about this.  Nelli Swalley,cma

## 2018-12-01 NOTE — Telephone Encounter (Signed)
Yes I have refilled her metformin immediate release.  Take 1 tablet two times daily with food to avoid nausea.   MAKE SURE HER OLD STASH IS NOT THE METFORMIN  XR.   Has she started checking her sugars yet?

## 2018-12-01 NOTE — Telephone Encounter (Addendum)
Spoke with pt this morning and after giving her the labs results on Tuesday she has been nrevous, worried about her blood sugar and A1c. The pt was wondering if you would just like for her to go back on the metformin that she was taking sometime ago. She stated that she has plenty left. Pt also stated that she has started walking again and trying to change her diet but she is afraid that the diet alone isn't going to help her.

## 2018-12-06 ENCOUNTER — Telehealth: Payer: Self-pay

## 2018-12-06 NOTE — Telephone Encounter (Signed)
Spoke with pt today and she is concerned with her BS readings. Pt was started on Metformin 500mg  BID last Friday and has changed her eating habits as well as started exercising. The pt stated that her BS reading are still high.   6/13  Fasting-266          Post prandial-317 6/14  Fasting-256          Post Prandial-304 6/15  Fasting-298          Post Prandial-286 6/16  Fasting-301          Post Prandial-258 6/17  Fasting-305   Pt is scheduled for a virtual visit with you tomorrow morning.

## 2018-12-07 ENCOUNTER — Encounter: Payer: Self-pay | Admitting: Internal Medicine

## 2018-12-07 ENCOUNTER — Other Ambulatory Visit: Payer: Self-pay

## 2018-12-07 ENCOUNTER — Ambulatory Visit (INDEPENDENT_AMBULATORY_CARE_PROVIDER_SITE_OTHER): Payer: Medicare HMO | Admitting: Internal Medicine

## 2018-12-07 DIAGNOSIS — E1165 Type 2 diabetes mellitus with hyperglycemia: Secondary | ICD-10-CM | POA: Diagnosis not present

## 2018-12-07 MED ORDER — METFORMIN HCL 500 MG PO TABS: ORAL_TABLET | ORAL | 3 refills | Status: DC

## 2018-12-07 MED ORDER — GLIPIZIDE 5 MG PO TABS: 5.0000 mg | ORAL_TABLET | Freq: Two times a day (BID) | ORAL | 3 refills | Status: DC

## 2018-12-07 NOTE — Patient Instructions (Signed)
Increase your evening dose of metformin to 1000 mg  And take it BEFORE  dinner  Continue 500 mg of metformin in the morning,  But take it BEFORE breakfast   I AM ADDING 5 mg glipizide twice daily.  TAKE IT WITH THE METFORMIN ,  BEFORE BREAKFAST AND DINNER  Send me BS readings  In one week

## 2018-12-07 NOTE — Progress Notes (Signed)
Virtual Visit converted to Telephone  Note  This visit type was conducted due to national recommendations for restrictions regarding the COVID-19 pandemic (e.g. social distancing).  This format is felt to be most appropriate for this patient at this time.  All issues noted in this document were discussed and addressed.  No physical exam was performed (except for noted visual exam findings with Video Visits).   I attempted to connect with@ on 11/15/18 at  8:30 AM EDT by a video enabled telemedicine application ; however  Interactive audio and video telecommunications  Ultimately failed, due to patient having technical difficulties.   We continued and completed visit with audio only. I and verified that I am speaking with the correct person using two identifiers. Location patient: home Location provider: work or home office Persons participating in the virtual visit: patient, provider  I discussed the limitations, risks, security and privacy concerns of performing an evaluation and management service by telephone and the availability of in person appointments. I also discussed with the patient that there may be a patient responsible charge related to this service. The patient expressed understanding and agreed to proceed.   Reason for visit: uncontrolled  type 2 dm  HPI:  68 yr old female with history of prediabetes for several years , recently diagnosed with uncontrolled type 2 DM with an A!c of 16 and a serum glucose > 300. She has been losing weight  In spite of not exercising or following a careful diet   She began taking metformin 6 days ago 500 mg bid .  Has been checking BS twice daily , modifying diet,  And exercising .  Her fasting sugars are currently  250 to 300,  Post prandials  250 to 300 as well but seeing some favorable trend .  225 yesterday   She is very concerned about her sugars and is requesting additional medication .  Discussed the risks and benefits of various types of oral  vs injected anti hyperglycemics.     ROS: See pertinent positives and negatives per HPI.  Past Medical History:  Diagnosis Date  . Hypertension   . Obesity   . OSA (obstructive sleep apnea)    now off of CAPAP without symptoms     Past Surgical History:  Procedure Laterality Date  . ABDOMINAL HYSTERECTOMY    . BILATERAL OOPHORECTOMY  2007   [place]    Family History  Problem Relation Age of Onset  . Breast cancer Sister 3    SOCIAL HX:  reports that she has never smoked. She has never used smokeless tobacco. She reports current alcohol use. She reports that she does not use drugs.   Current Outpatient Medications:  .  amLODipine (NORVASC) 10 MG tablet, TAKE 1 TABLET(10 MG) BY MOUTH DAILY, Disp: 90 tablet, Rfl: 3 .  metFORMIN (GLUCOPHAGE) 500 MG tablet, 1 tablet in the morning and two in the evening before meals, Disp: 180 tablet, Rfl: 3 .  OneTouch Delica Lancets 84Z MISC, , Disp: , Rfl:  .  ONETOUCH VERIO test strip, , Disp: , Rfl:  .  glipiZIDE (GLUCOTROL) 5 MG tablet, Take 1 tablet (5 mg total) by mouth 2 (two) times daily before a meal., Disp: 180 tablet, Rfl: 3  EXAM:   General impression: alert, cooperative and articulate.  No signs of being in distress  Lungs: speech is fluent sentence length suggests that patient is not short of breath and not punctuated by cough, sneezing or sniffing. Marland Kitchen  Psych: affect normal.  speech is articulate and non pressured .  Denies suicidal thoughts   ASSESSMENT AND PLAN:  Discussed the following assessment and plan:  Type 2 diabetes mellitus, uncontrolled (HCC) Secondary to dietary and lifestyle noncompliance.  Continue metformin,  Increase evening dose to 1000 mg , and adding glipizide  5 mg bid .  Will consider adding Ozempic to help with weight loss needed     I discussed the assessment and treatment plan with the patient. The patient was provided an opportunity to ask questions and all were answered. The patient agreed  with the plan and demonstrated an understanding of the instructions.   The patient was advised to call back or seek an in-person evaluation if the symptoms worsen or if the condition fails to improve as anticipated.  I provided 25 minutes of non-face-to-face time during this encounter.   Sherlene Shamseresa L Wash Nienhaus, MD

## 2018-12-09 NOTE — Assessment & Plan Note (Addendum)
Secondary to dietary and lifestyle noncompliance.  Continue metformin,  Increase evening dose to 1000 mg , and adding glipizide  5 mg bid .  Will consider adding Ozempic to help with weight loss needed

## 2018-12-15 ENCOUNTER — Telehealth: Payer: Self-pay | Admitting: *Deleted

## 2018-12-15 NOTE — Telephone Encounter (Signed)
Blood sugars reviewed.  Increase metformin to  1000 mg in the morning,  Continue 1000 mg in the evening  Continue 5 mg glipzide in the morning and afternoon  contiue checking fasting sugars  ,  Change the second check to 2 hours after dinner  Send readings in one week

## 2018-12-15 NOTE — Telephone Encounter (Signed)
Copied from [place] [id number]. Topic: General - Other >> Dec 15, 2018  3:05 PM Jeri Cos wrote: Reason for CRM: Pt called to give her weekly glucose readings. Pt does not understand how to use MyChart to send a message. 6/19 fasting 201 after breakfast 267, 6/20 192 / 191, 6/21 186 / 202, 6/22 195 / 167, 6/23 164 / 146, 6/24 139 / 165, 6/25 162 / 157, 6/26 138 / 138

## 2018-12-16 DIAGNOSIS — I10 Essential (primary) hypertension: Secondary | ICD-10-CM | POA: Diagnosis not present

## 2018-12-16 DIAGNOSIS — G4733 Obstructive sleep apnea (adult) (pediatric): Secondary | ICD-10-CM | POA: Diagnosis not present

## 2018-12-18 NOTE — Telephone Encounter (Signed)
Patient voiced understanding to regimen and testing, patient stated some paperwork ws sent via my chart she cannot complete paperwork ask could it be mailed.

## 2018-12-20 ENCOUNTER — Other Ambulatory Visit: Payer: Self-pay | Admitting: Internal Medicine

## 2018-12-20 ENCOUNTER — Telehealth: Payer: Self-pay

## 2018-12-20 MED ORDER — METFORMIN HCL ER 750 MG PO TB24: 750.0000 mg | ORAL_TABLET | Freq: Every day | ORAL | 1 refills | Status: DC

## 2018-12-20 NOTE — Telephone Encounter (Signed)
Called patient back to see if she was experiencing any other symptoms with metformin. No answer.  Unable to leave a voice message.

## 2018-12-20 NOTE — Progress Notes (Signed)
My Chart message sent

## 2018-12-20 NOTE — Telephone Encounter (Signed)
Pt sent a mychart message about this to Dr. Derrel Nip.

## 2018-12-20 NOTE — Telephone Encounter (Signed)
Copied from [place] [id number]. Topic: General - Other >> Dec 20, 2018 10:07 AM Celene Kras A wrote: Reason for CRM: Pt states she is experiencing extreme bloatedness due to the metformin she is on currently. Pt states she would like to be put back on the extended release.  Pt is requesting a call back. Please advise.  [id number] - [place], [place] - [address] [place] [address] Phone: [phone] Fax: [phone] Not a 24 hour pharmacy; exact hours not known.

## 2018-12-25 ENCOUNTER — Telehealth: Payer: Self-pay

## 2018-12-25 DIAGNOSIS — E1165 Type 2 diabetes mellitus with hyperglycemia: Secondary | ICD-10-CM

## 2018-12-25 NOTE — Telephone Encounter (Signed)
If she is tolerating the metformin without nausea and diarrhea, then continue current dose but DECREASE the evening dose of GLIPIZIDE to 2.5 mg   Confirm above so chart can be updated correction

## 2018-12-25 NOTE — Assessment & Plan Note (Signed)
evenind soe of glipizide decreased to 2.5 mg ng for postprandials of 80

## 2018-12-25 NOTE — Telephone Encounter (Signed)
Copied from [place] [id number]. Topic: General - Other >> Dec 25, 2018 10:18 AM Virl Axe D wrote: Reason for CRM: Pt called to give weekly glucose readings 12/16/18 138 / 2 hours after breakfast  138  12/17/18 134 / 2 hours after breakfast  113 12/18/18 115 / 2 hours after breakfast  98 12/19/18 2 hours after breakfast 127/ 2 hours after dinner 80 12/20/18 2 hours after breakfast 101 /  2 hours after dinner 94 12/21/18 2 hours after breakfast 106 / 2 hours after dinner 98

## 2018-12-26 NOTE — Telephone Encounter (Signed)
Spoke with pt and she stated that she is tolerating the metformin without any nausea or diarrhea. So pt was advised to continue with the metformin and to reduce her dose of glipizide to 2.5mg  in the evening only. Pt repeated all instructions back with understanding.

## 2019-01-01 MED ORDER — ONETOUCH VERIO VI STRP: ORAL_STRIP | 8 refills | Status: DC

## 2019-01-01 NOTE — Telephone Encounter (Signed)
Sugars are excellent,  Continue current regimen,  Test strips have been refilled with directions to test twice daily

## 2019-01-01 NOTE — Telephone Encounter (Signed)
Pt calling to give weekly glucose readings  Week 5  7.6.20   140 2 hrs after breakfast 2hrs after dinner 84 7.7.20   108 2 hrs after breakfast 2hrs after dinner 90 7.8.20   104 2 hrs after breakfast 2hrs after dinner 103 7.9.20   125 2 hrs after breakfast 2hrs after dinner 97 7.10.20  84 2 hrs after breakfast  2hrs after dinner 117 7.11.20  113 2 hrs after breakfast 2hrs after dinner 92 7.12.20  123 2 hrs after breakfast 2hrs after dinner 90  Pt also need to speak to nurse concerning her  Blood glucose strips and need a new order that states Must put that pts checks her sugar twice a day in order for medicare to pay for it.

## 2019-01-02 NOTE — Telephone Encounter (Signed)
Spoke with pt and informed her that her reading are excellent and that she needs to continue taking the current medications and checking her sugars twice daily. The pt stated that last night after she walked she checked her sugar and it had dropped down to 70. The pt stated that she only felt really tired but thought that was just because of the walk she did. Pt was told that if her sugar drops any lower than that she would need to call our office and let us know. Pt gave a verbal understanding.

## 2019-01-03 ENCOUNTER — Telehealth: Payer: Self-pay

## 2019-01-03 NOTE — Telephone Encounter (Signed)
Copied from CRM #[id number]. Topic: General - Other >> Dec 25, 2018 10:18 AM Hudson, Caryn D wrote: Reason for CRM: Pt called to give weekly glucose readings 12/16/18 138 / 2 hours after breakfast  138  12/17/18 134 / 2 hours after breakfast  113 12/18/18 115 / 2 hours after breakfast  98 12/19/18 2 hours after breakfast 127/ 2 hours after dinner 80 12/20/18 2 hours after breakfast 101 /  2 hours after dinner 94 12/21/18 2 hours after breakfast 106 / 2 hours after dinner 98 

## 2019-01-03 NOTE — Telephone Encounter (Signed)
Duplicate message. Already taking care of in previous message.

## 2019-01-05 ENCOUNTER — Telehealth: Payer: Self-pay | Admitting: Internal Medicine

## 2019-01-05 MED ORDER — METFORMIN HCL 850 MG PO TABS: 850.0000 mg | ORAL_TABLET | Freq: Two times a day (BID) | ORAL | 1 refills | Status: DC

## 2019-01-05 NOTE — Telephone Encounter (Signed)
Spoke with pt and she stated that she just is not comfortable taking the metformin xr and would like to try the metformin immediate release again. I tried explaining to pt that the one that she is taking has not been recalled but she still did not feel comfortable about taking it.

## 2019-01-05 NOTE — Telephone Encounter (Signed)
An rx for immediate release metformin 850 mg twice daily has been sent to [facility]

## 2019-01-05 NOTE — Telephone Encounter (Signed)
Pt received a letter about her metformin being recalled and she wants to know what she can change to and she will not take it after today / please advise

## 2019-01-08 ENCOUNTER — Telehealth: Payer: Self-pay | Admitting: Internal Medicine

## 2019-01-08 DIAGNOSIS — E7800 Pure hypercholesterolemia, unspecified: Secondary | ICD-10-CM

## 2019-01-08 DIAGNOSIS — E1165 Type 2 diabetes mellitus with hyperglycemia: Secondary | ICD-10-CM

## 2019-01-08 NOTE — Telephone Encounter (Signed)
Pt calling in with her weekly blood glucose reading  Week 6 01/01/2019 2 hrs after breakfast 130 2 hrs after dinner 70  01/02/2019 2 hrs after breakfast 143 2 hrs after dinner 105  01/03/2019 2 hrs after breakfast 72 2 hrs after dinner 107  01/04/2019 2 hrs after breakfast 93 *this is when she was put back on Metformin 850mg  immediate release* 2 hrs after dinner 115  01/05/2019 2 hrs after breakfast 126 2 hours after dinner 134  01/06/2019 2 hrs after breakfast 108 2 hours after dinner 86  01/07/2019 2 hrs after breakfast 116 2 hrs after dinner 86

## 2019-01-08 NOTE — Telephone Encounter (Signed)
Sugars look good.  If she has any more below 80 we will need to stop or reduce the glipizide dose  Is she taking 5 mg bid ?

## 2019-01-08 NOTE — Telephone Encounter (Signed)
mychart message was read by pt on 01/05/2019.

## 2019-01-09 MED ORDER — GLIPIZIDE 5 MG PO TABS: 2.5000 mg | ORAL_TABLET | Freq: Two times a day (BID) | ORAL | 3 refills | Status: DC

## 2019-01-09 NOTE — Telephone Encounter (Signed)
Spoke with pt and she stated that she is taking glipizide 5mg  in the morning and glipizide 2.5mg  in the evening. The pt also stated that she was curious as to why the metformin was changed to 850mg  immediate release and not the 750mg  immediate release she was on before.

## 2019-01-09 NOTE — Telephone Encounter (Signed)
It is not available in the immediate release at the 750 mg dose .  Have her reduce the morning glipizide to 2.5 mg for now since her post prandials are low in the morning

## 2019-01-09 NOTE — Telephone Encounter (Signed)
Spoke with pt to let her know that she now needs to take 2.5mg  of glipizide both morning and evening. Pt gave a verbal understanding.

## 2019-01-15 ENCOUNTER — Telehealth: Payer: Self-pay

## 2019-01-15 DIAGNOSIS — I10 Essential (primary) hypertension: Secondary | ICD-10-CM | POA: Diagnosis not present

## 2019-01-15 DIAGNOSIS — G4733 Obstructive sleep apnea (adult) (pediatric): Secondary | ICD-10-CM | POA: Diagnosis not present

## 2019-01-15 NOTE — Telephone Encounter (Signed)
Copied from [place] [id number]. Topic: General - Other >> Jan 15, 2019 11:20 AM Parke Poisson wrote: Reason for CRM: Pt called with weekly glucose reading  01/08/19 2 hrs after breakfast 109 ~~2 hrs after dinner 99 01/09/19 2 hrs after breakfast 98 ~~2 hrs after dinner 71 01/10/19 2 hrs after breakfast 105~~2 hrs after dinner 84  was told to reduce Glipizide to half tablet in the PM  01/11/19 2 hrs after breakfast 98 ~~2 hrs after dinner 86 01/12/19 2 hrs after breakfast 94~~2 hrs after dinner 85 7/25/202 hrs after breakfast 105~~2 hrs after dinner 92 01/14/19 2 hrs after breakfast 119~~2 hrs after dinner 90. She feels the morning reading was high due to eating Soler bread

## 2019-01-15 NOTE — Telephone Encounter (Signed)
Please tell her that 119 is NOT high for a post prandial reading.  THE GOAL IS >120 and < 160 POST PRANDIAL.     So she needs to stop the evenign dose of glipizide COMPLETELY

## 2019-01-16 DIAGNOSIS — G4733 Obstructive sleep apnea (adult) (pediatric): Secondary | ICD-10-CM | POA: Diagnosis not present

## 2019-01-16 DIAGNOSIS — I10 Essential (primary) hypertension: Secondary | ICD-10-CM | POA: Diagnosis not present

## 2019-01-16 NOTE — Telephone Encounter (Addendum)
Patient notified and voiced understanding. "Patient repeated stop evening dose of Glipizide."

## 2019-01-24 ENCOUNTER — Other Ambulatory Visit: Payer: Self-pay | Admitting: Internal Medicine

## 2019-01-24 DIAGNOSIS — Z1231 Encounter for screening mammogram for malignant neoplasm of breast: Secondary | ICD-10-CM

## 2019-01-29 NOTE — Telephone Encounter (Signed)
Blood glucose readings from last week

## 2019-01-29 NOTE — Telephone Encounter (Signed)
Spoke with pt to let her know that she needs to stop the glipizide first and see how her BS readings are without the glipizide.

## 2019-01-29 NOTE — Telephone Encounter (Signed)
No,   she needs to stop the glipizide first

## 2019-01-29 NOTE — Telephone Encounter (Signed)
Pt states she did not get a call last week concerning her readings. This is week 09  All readings are 2 hrs am after breakfast and 2 hrs after dinner 8/03 breakfast  107 dinner  94  8/04 breakfast  105    Dinner 96  8/05  breakfast 108 Dinner 104  08/06 breakfast  115 dinner 87  08/7  breakfast 113  dinner 86 8/08 breakfast 88 Dinner  96  8/09  breakfast 86 Dinner 97

## 2019-01-29 NOTE — Telephone Encounter (Deleted)
Copied from CRM #[id number]. Topic: General - Other >> Jan 15, 2019 11:20 AM Butler, Penny wrote: Reason for CRM: Pt called with weekly glucose reading  01/08/19 2 hrs after breakfast 109 ~~2 hrs after dinner 99 01/09/19 2 hrs after breakfast 98 ~~2 hrs after dinner 71 01/10/19 2 hrs after breakfast 105~~2 hrs after dinner 84  was told to reduce Glipizide to half tablet in the PM  01/11/19 2 hrs after breakfast 98 ~~2 hrs after dinner 86 01/12/19 2 hrs after breakfast 94~~2 hrs after dinner 85 7/25/202 hrs after breakfast 105~~2 hrs after dinner 92 01/14/19 2 hrs after breakfast 119~~2 hrs after dinner 90. She feels the morning reading was high due to eating Soler bread 

## 2019-01-29 NOTE — Telephone Encounter (Signed)
Pt just called back stating that this morning she has just felt like she has no energy so she stated that she ate breakfast and then two hours after eating she checked her BS. She stated that two hours after eating her BS was 70. She ate something and has started to feel a little bit better. Pt is wondering if she can reduce her metformin dose.

## 2019-01-29 NOTE — Telephone Encounter (Signed)
Reading are fine.  Continue current meds,  can stop calling in readings and plan to follow up at 3 month intervalfrom last visit

## 2019-01-31 ENCOUNTER — Other Ambulatory Visit: Payer: Self-pay | Admitting: Internal Medicine

## 2019-02-05 NOTE — Telephone Encounter (Signed)
Copied from [place] [id number]. Topic: General - Other >> Feb 05, 2019  4:07 PM Celene Kras A wrote: Reason for CRM: Pt called stating her glucose reading is week 10  8/10 2 hours after breakfast 70, 2 hours after dinner 105.  8/11 2 hours after breakfast 82, 2 hours after dinner 92 8/12 2 hours after breakfast 98, 2 hours after dinner 105 (no longer taking glipizide) 8/13 2 hours after breakfast 112, 2 hours after dinner 103 8/14 2 hours after breakfast 111, 2 hours after dinner 90 8/15 2 hours after breakfast 107, 2 hours after dinner 102 8/16 2 hours after breakfast 111, 2 hours after dinner 100

## 2019-02-06 NOTE — Telephone Encounter (Signed)
Attempted to call pt no answer no voicemail.  

## 2019-02-06 NOTE — Telephone Encounter (Signed)
Readings are still excellent and no lows anymore.  Stay off the glipizide. Set her up for  fasting labs on or after sept 4  Lab Results  Component Value Date   HGBA1C 16.1 (H) 11/22/2018

## 2019-02-06 NOTE — Telephone Encounter (Signed)
Readings after pt stopped taking glipizide.

## 2019-02-12 ENCOUNTER — Telehealth: Payer: Self-pay | Admitting: Internal Medicine

## 2019-02-12 NOTE — Telephone Encounter (Signed)
Patient's weekly Glucose Readings: Week 11 -   02/05/2019 > 2hrs after breakfast - 119,  2hrs after dinner - 105 02/06/2019 > 2hrs after breakfast - 106, 2hrs after dinner - 100 02/07/2019 > 2hrs aftrer breakfast - 96, 2hrs after dinner - 100 02/08/2019 > 2hrs after breakfast - 84, 2hrs after dinner - 97 02/09/2019 > 2hrs after breakfast - 96, 2hrs after dinner - 115 02/10/2019 > 2hrs after breakfast - 115, 2jrs after dinner - 90 02/11/2019 > 2hrs after breakfast - 95, 2hrs after dinner - 100

## 2019-02-12 NOTE — Telephone Encounter (Signed)
Spoke with pt to let her know that her sugar readings are under excellent control and that she can go to just checking her sugars once daily at random times. Pt gave a verbal understanding.

## 2019-02-12 NOTE — Telephone Encounter (Signed)
Glucose readings listed below

## 2019-02-12 NOTE — Telephone Encounter (Signed)
Excellent control.  She can stop checking twice daily ,  Just once daily randomly is now fine   Lab Results  Component Value Date   HGBA1C 16.1 (H) 11/22/2018

## 2019-02-15 DIAGNOSIS — I10 Essential (primary) hypertension: Secondary | ICD-10-CM | POA: Diagnosis not present

## 2019-02-15 DIAGNOSIS — G4733 Obstructive sleep apnea (adult) (pediatric): Secondary | ICD-10-CM | POA: Diagnosis not present

## 2019-02-25 ENCOUNTER — Other Ambulatory Visit: Payer: Self-pay | Admitting: Internal Medicine

## 2019-02-27 DIAGNOSIS — R69 Illness, unspecified: Secondary | ICD-10-CM | POA: Diagnosis not present

## 2019-03-02 ENCOUNTER — Ambulatory Visit
Admission: RE | Admit: 2019-03-02 | Discharge: 2019-03-02 | Disposition: A | Discharge status: Home / Self Care | Payer: Medicare HMO | Source: Ambulatory Visit | Attending: Internal Medicine | Admitting: Internal Medicine

## 2019-03-02 DIAGNOSIS — Z1231 Encounter for screening mammogram for malignant neoplasm of breast: Secondary | ICD-10-CM | POA: Diagnosis not present

## 2019-03-05 ENCOUNTER — Other Ambulatory Visit: Payer: Self-pay | Admitting: Internal Medicine

## 2019-03-05 DIAGNOSIS — N6489 Other specified disorders of breast: Secondary | ICD-10-CM

## 2019-03-05 DIAGNOSIS — R928 Other abnormal and inconclusive findings on diagnostic imaging of breast: Secondary | ICD-10-CM

## 2019-03-13 ENCOUNTER — Ambulatory Visit
Admission: RE | Admit: 2019-03-13 | Discharge: 2019-03-13 | Disposition: A | Discharge status: Home / Self Care | Payer: Medicare HMO | Source: Ambulatory Visit | Attending: Internal Medicine | Admitting: Internal Medicine

## 2019-03-13 DIAGNOSIS — N6489 Other specified disorders of breast: Secondary | ICD-10-CM

## 2019-03-13 DIAGNOSIS — R928 Other abnormal and inconclusive findings on diagnostic imaging of breast: Secondary | ICD-10-CM | POA: Insufficient documentation

## 2019-03-18 DIAGNOSIS — I10 Essential (primary) hypertension: Secondary | ICD-10-CM | POA: Diagnosis not present

## 2019-03-18 DIAGNOSIS — G4733 Obstructive sleep apnea (adult) (pediatric): Secondary | ICD-10-CM | POA: Diagnosis not present

## 2019-03-22 DIAGNOSIS — R69 Illness, unspecified: Secondary | ICD-10-CM | POA: Diagnosis not present

## 2019-03-28 ENCOUNTER — Telehealth: Payer: Self-pay | Admitting: Internal Medicine

## 2019-03-28 MED ORDER — METFORMIN HCL 850 MG PO TABS: 850.0000 mg | ORAL_TABLET | Freq: Two times a day (BID) | ORAL | 1 refills | Status: DC

## 2019-03-28 NOTE — Telephone Encounter (Signed)
Metformin dose was not changed  The glipizide dose was reduced and then stopped in the evening  She needs to return for labs ASAP   Lab Results  Component Value Date   HGBA1C 16.1 (H) 11/22/2018

## 2019-03-28 NOTE — Telephone Encounter (Signed)
Pt stated she has stopped taking the 850mg  of metFormin and is now taking 500mg  however her pharmacy stated they have not heard from the office regarding a new rx for 500mg . Pt stated pharmacy told her they have reached out to office with no result. Pt was not aware that she had been changed to 500mg  but she is okay with this and started the 500mg  2x a day this week. Pharmacy needs to hear from office and pt would like to updated as well. Please advise. CB#[phone]   [id number] - [place], [place] - [address] [phone] (Phone) [phone] (Fax)

## 2019-03-28 NOTE — Telephone Encounter (Signed)
Pt stated she has stopped taking the 850mg of metFormin and is now taking 500mg however her pharmacy stated they have not heard from the office regarding a new rx for 500mg. Pt stated pharmacy told her they have reached out to office with no result. Pt was not aware that she had been changed to 500mg but she is okay with this and started the 500mg 2x a day this week. Pharmacy needs to hear from office and pt would like to updated as well. Please advise. CB#[phone]  ° °[id number] - [place], [place] - [address] [phone] (Phone) [phone] (Fax)  ° ° °

## 2019-03-28 NOTE — Telephone Encounter (Signed)
I don't see in the chart where the pt was told to stop taking the 850mg  and start taking the 500mg . Which is the pt supposed to be taking?

## 2019-03-29 NOTE — Telephone Encounter (Signed)
Attempted to call pt no answer no voicemail. If pt calls back [facility] may speak with pt.

## 2019-04-03 NOTE — Telephone Encounter (Signed)
Spoke with pt and she stated that the pharmacy filled an old prescription for the 500mg  and it had confused her but she stated that she is taking the 850mg  BID. Pt is scheduled for fasting lab work on 04/10/2019. Pt is aware of appt date and time.

## 2019-04-10 ENCOUNTER — Other Ambulatory Visit (INDEPENDENT_AMBULATORY_CARE_PROVIDER_SITE_OTHER): Payer: Medicare HMO

## 2019-04-10 ENCOUNTER — Other Ambulatory Visit: Payer: Self-pay

## 2019-04-10 DIAGNOSIS — E1165 Type 2 diabetes mellitus with hyperglycemia: Secondary | ICD-10-CM | POA: Diagnosis not present

## 2019-04-10 DIAGNOSIS — E7800 Pure hypercholesterolemia, unspecified: Secondary | ICD-10-CM | POA: Diagnosis not present

## 2019-04-10 LAB — COMPREHENSIVE METABOLIC PANEL
ALT: 19 U/L (ref 0–35)
AST: 15 U/L (ref 0–37)
Albumin: 4.4 g/dL (ref 3.5–5.2)
Alkaline Phosphatase: 66 U/L (ref 39–117)
BUN: 18 mg/dL (ref 6–23)
CO2: 27 mEq/L (ref 19–32)
Calcium: 10 mg/dL (ref 8.4–10.5)
Chloride: 105 mEq/L (ref 96–112)
Creatinine, Ser: 0.8 mg/dL (ref 0.40–1.20)
GFR: 86.88 mL/min (ref >60.00)
Glucose, Bld: 116 mg/dL — ABNORMAL HIGH (ref 70–99)
Potassium: 3.7 mEq/L (ref 3.5–5.1)
Sodium: 141 mEq/L (ref 135–145)
Total Bilirubin: 0.4 mg/dL (ref 0.2–1.2)
Total Protein: 6.7 g/dL (ref 6.0–8.3)

## 2019-04-10 LAB — LIPID PANEL
Cholesterol: 237 mg/dL — ABNORMAL HIGH (ref 0–200)
HDL: 40.4 mg/dL (ref >39.00)
LDL Cholesterol: 169 mg/dL — ABNORMAL HIGH (ref 0–99)
NonHDL: 196.81
Total CHOL/HDL Ratio: 6
Triglycerides: 141 mg/dL (ref 0.0–149.0)
VLDL: 28.2 mg/dL (ref 0.0–40.0)

## 2019-04-10 LAB — HEMOGLOBIN A1C: Hgb A1c MFr Bld: 5.9 % (ref 4.6–6.5)

## 2019-04-17 ENCOUNTER — Telehealth: Payer: Self-pay | Admitting: Internal Medicine

## 2019-04-17 DIAGNOSIS — I10 Essential (primary) hypertension: Secondary | ICD-10-CM | POA: Diagnosis not present

## 2019-04-17 DIAGNOSIS — G4733 Obstructive sleep apnea (adult) (pediatric): Secondary | ICD-10-CM | POA: Diagnosis not present

## 2019-04-17 NOTE — Telephone Encounter (Signed)
Pt is calling and the metformin 850 mg twice a day  is causing her to itch and her bs is under control. Pt would like to know why is she on high dose. Patient is requesting Janett Billow to call her back

## 2019-04-19 NOTE — Telephone Encounter (Signed)
Pt is scheduled for a virtual visit with Dr. Derrel Nip on Monday. Pt is aware.

## 2019-04-23 ENCOUNTER — Ambulatory Visit (INDEPENDENT_AMBULATORY_CARE_PROVIDER_SITE_OTHER): Payer: Medicare HMO | Admitting: Internal Medicine

## 2019-04-23 ENCOUNTER — Other Ambulatory Visit: Payer: Self-pay

## 2019-04-23 ENCOUNTER — Encounter: Payer: Self-pay | Admitting: Internal Medicine

## 2019-04-23 DIAGNOSIS — I10 Essential (primary) hypertension: Secondary | ICD-10-CM | POA: Diagnosis not present

## 2019-04-23 DIAGNOSIS — E119 Type 2 diabetes mellitus without complications: Secondary | ICD-10-CM

## 2019-04-23 MED ORDER — METFORMIN HCL 850 MG PO TABS: 850.0000 mg | ORAL_TABLET | Freq: Every day | ORAL | 1 refills | Status: DC

## 2019-04-23 NOTE — Assessment & Plan Note (Signed)
Currently well-controlled on one daily dose of metformin.  hemoglobin A1c is at goal of less than 7.0 . Patient is reminded to schedule an annual eye exam and get her pneumonia vaccine  Patient has no microalbuminuria. Patient is intolerant of statin therapy for CAD risk reduction but has started taking Red Yeast Rice and coQ 10

## 2019-04-23 NOTE — Assessment & Plan Note (Signed)
Managed with amlodipine 10 mg daily.  Advised patient to purchase a home machine to monitor BP  ,  Goal 130/80 or less   Lab Results  Component Value Date   CREATININE 0.80 04/10/2019   Lab Results  Component Value Date   NA 141 04/10/2019   K 3.7 04/10/2019   CL 105 04/10/2019   CO2 27 04/10/2019   Lab Results  Component Value Date   MICROALBUR 1.6 12/10/2016

## 2019-04-23 NOTE — Assessment & Plan Note (Signed)
I have congratulated her in reduction of   BMI and encouraged  Continued weight loss with goal of 10% of body weigh over the next 6 months using a low glycemic index diet and regular exercise a minimum of 5 days per week.    

## 2019-04-23 NOTE — Progress Notes (Signed)
Virtual Visit  converted to Telephone  This visit type was conducted due to national recommendations for restrictions regarding the COVID-19 pandemic (e.g. social distancing).  This format is felt to be most appropriate for this patient at this time.  All issues noted in this document were discussed and addressed.  No physical exam was performed (except for noted visual exam findings with Video Visits).   I connected with@ on 04/23/19 at 10:00 AM EST by a video enabled telemedicine application. Interactive audio and video telecommunications were attempted between this provider and patient, however failed, due to patient having technical difficulties OR patient did not have access to video capability.  We continued and completed visit with audio only.r telephone and verified that I am speaking with the correct person using two identifiers. Location patient: home Location provider: work or home office Persons participating in the virtual visit: patient, provider    I discussed the limitations, risks, security and privacy concerns of performing an evaluation and management service by telephone and the availability of in person appointments. I also discussed with the patient that there may be a patient responsible charge related to this service. The patient expressed understanding and agreed to proceed.      Reason for visit: follow up on diabetes   HPI:  4 month follow up on diabetes.  Patient has no complaints today.  Patient is following a low glycemic index diet and taking a reduced dose of metformin to once daily  due to side effects of itching and hot flashes, both of which have improved..   Fasting sugars have been under less than 140 most of the time and post prandials have been under 160 except on rare occasions. Patient is walking 5 days per week  and intentionally trying to lose weight .  Patient has had an eye exam in the last 12 months and checks feet regularly for signs of infection.   Patient does not walk barefoot outside,  And denies an numbness tingling or burning in feet. Patient is up to date on all recommended vaccinations  Started taking RYR with Co Q 10 on Saturday for elevated cholesterol.   Hypertension: patient checks blood pressure twice weekly at home.  Readings have been for the most part <130/80 at rest . Patient is following a reduced salt diet most days and is taking medications as prescribed    ROS: See pertinent positives and negatives per HPI.  Past Medical History:  Diagnosis Date  . Hypertension   . Obesity   . OSA (obstructive sleep apnea)    now off of CAPAP without symptoms     Past Surgical History:  Procedure Laterality Date  . ABDOMINAL HYSTERECTOMY    . BILATERAL OOPHORECTOMY  2007   [place]    Family History  Problem Relation Age of Onset  . Breast cancer Sister 11    SOCIAL HX:  reports that she has never smoked. She has never used smokeless tobacco. She reports current alcohol use. She reports that she does not use drugs.   Current Outpatient Medications:  .  amLODipine (NORVASC) 10 MG tablet, TAKE 1 TABLET BY MOUTH EVERY DAY, Disp: 90 tablet, Rfl: 1 .  glucose blood (ONETOUCH VERIO) test strip, USE AS DIRECTED EVERY DAY, Disp: 100 strip, Rfl: 5 .  metFORMIN (GLUCOPHAGE) 850 MG tablet, Take 1 tablet (850 mg total) by mouth daily with breakfast., Disp: 90 tablet, Rfl: 1 .  OneTouch Delica Lancets 30G MISC, USE AS DIRECTED EVERY DAY, Disp:  100 each, Rfl: 5  EXAM:   General impression: alert, cooperative and articulate.  No signs of being in distress  Lungs: speech is fluent sentence length suggests that patient is not short of breath and not punctuated by cough, sneezing or sniffing. Marland Kitchen   Psych: affect normal.  speech is articulate and non pressured .  Denies suicidal thoughts   ASSESSMENT AND PLAN:  Discussed the following assessment and plan:  Morbid obesity ([place]), Chronic  Diabetes mellitus with no complication  ([place]) - Plan: Microalbumin / creatinine urine ratio, Comprehensive metabolic panel, Lipid panel, Hemoglobin A1c  Essential hypertension  Diabetes mellitus with no complication (HCC) Currently well-controlled on one daily dose of metformin.  hemoglobin A1c is at goal of less than 7.0 . Patient is reminded to schedule an annual eye exam and get her pneumonia vaccine  Patient has no microalbuminuria. Patient is intolerant of statin therapy for CAD risk reduction but has started taking Red Yeast Rice and coQ 10  Morbid obesity ([place]) I have congratulated her in reduction of   BMI and encouraged  Continued weight loss with goal of 10% of body weigh over the next 6 months using a low glycemic index diet and regular exercise a minimum of 5 days per week.    Hypertension Managed with amlodipine 10 mg daily.  Advised patient to purchase a home machine to monitor BP  ,  Goal 130/80 or less   Lab Results  Component Value Date   CREATININE 0.80 04/10/2019   Lab Results  Component Value Date   NA 141 04/10/2019   K 3.7 04/10/2019   CL 105 04/10/2019   CO2 27 04/10/2019   Lab Results  Component Value Date   MICROALBUR 1.6 12/10/2016       I discussed the assessment and treatment plan with the patient. The patient was provided an opportunity to ask questions and all were answered. The patient agreed with the plan and demonstrated an understanding of the instructions.   The patient was advised to call back or seek an in-person evaluation if the symptoms worsen or if the condition fails to improve as anticipated.  I provided  25 minutes of non-face-to-face time during this encounter reviewing patient's current problems and post surgeries.  Providing counseling on the above mentioned problems , and coordination  of care .   Crecencio Mc, MD

## 2019-05-14 ENCOUNTER — Encounter: Payer: Self-pay | Admitting: Internal Medicine

## 2019-05-18 DIAGNOSIS — I10 Essential (primary) hypertension: Secondary | ICD-10-CM | POA: Diagnosis not present

## 2019-05-18 DIAGNOSIS — G4733 Obstructive sleep apnea (adult) (pediatric): Secondary | ICD-10-CM | POA: Diagnosis not present

## 2019-05-29 DIAGNOSIS — R69 Illness, unspecified: Secondary | ICD-10-CM | POA: Diagnosis not present

## 2019-05-30 DIAGNOSIS — R69 Illness, unspecified: Secondary | ICD-10-CM | POA: Diagnosis not present

## 2019-06-04 DIAGNOSIS — R69 Illness, unspecified: Secondary | ICD-10-CM | POA: Diagnosis not present

## 2019-06-06 ENCOUNTER — Telehealth: Payer: Self-pay | Admitting: Internal Medicine

## 2019-06-06 NOTE — Telephone Encounter (Signed)
Please ask patient if she would consider taking rosuvastatin .  Recent studies have shown that for diabetes even once weekly statin therapy is protective against heart attacks. Compared to dietary measures

## 2019-06-11 NOTE — Telephone Encounter (Signed)
Spoke with pt and she stated that she isn't sure about the Crestor. She stated that she is currently taking the Red Yeast Rice and it was discussed that her lipid panel would be rechecked to see if it was helping. She stated that in her visit that it was also discussed if it was not helping that she could try a non statin cholesterol medication called Zetia.

## 2019-06-11 NOTE — Telephone Encounter (Signed)
Yes both meds (Red yeast rice and ZETIA will lower cholesterol,  But they do not have any effect on morbidity (*heart attacks) or mortality which is what  recent studies have shown even once weekly rosuvastatin to do.

## 2019-06-12 DIAGNOSIS — H5203 Hypermetropia, bilateral: Secondary | ICD-10-CM | POA: Diagnosis not present

## 2019-06-12 LAB — HM DIABETES EYE EXAM

## 2019-06-13 NOTE — Telephone Encounter (Signed)
Spoke with pt to let her know the difference the red yeast and zetia and the crestor. The pt still had a lot of questions and concerns so I advised the pt to schedule a virtual visit with Dr. Derrel Nip to discuss. Pt is scheduled for Monday the 28th at 10:30am. Pt is aware of appt date and time.

## 2019-06-17 DIAGNOSIS — I10 Essential (primary) hypertension: Secondary | ICD-10-CM | POA: Diagnosis not present

## 2019-06-17 DIAGNOSIS — G4733 Obstructive sleep apnea (adult) (pediatric): Secondary | ICD-10-CM | POA: Diagnosis not present

## 2019-06-18 ENCOUNTER — Ambulatory Visit: Payer: Medicare HMO | Admitting: Internal Medicine

## 2019-06-26 ENCOUNTER — Ambulatory Visit: Payer: Medicare HMO | Admitting: Internal Medicine

## 2019-06-29 ENCOUNTER — Encounter: Payer: Self-pay | Admitting: Internal Medicine

## 2019-06-29 ENCOUNTER — Ambulatory Visit (INDEPENDENT_AMBULATORY_CARE_PROVIDER_SITE_OTHER): Payer: Medicare HMO | Admitting: Internal Medicine

## 2019-06-29 VITALS — Ht 60.0 in | Wt 204.0 lb

## 2019-06-29 DIAGNOSIS — E7800 Pure hypercholesterolemia, unspecified: Secondary | ICD-10-CM

## 2019-06-29 DIAGNOSIS — E1169 Type 2 diabetes mellitus with other specified complication: Secondary | ICD-10-CM

## 2019-06-29 DIAGNOSIS — E785 Hyperlipidemia, unspecified: Secondary | ICD-10-CM | POA: Diagnosis not present

## 2019-06-29 NOTE — Progress Notes (Signed)
Virtual Visit converted tot Telephone   This visit type was conducted due to national recommendations for restrictions regarding the COVID-19 pandemic (e.g. social distancing).  This format is felt to be most appropriate for this patient at this time.  All issues noted in this document were discussed and addressed.  No physical exam was performed (except for noted visual exam findings with Video Visits).   I attempted to connect  with@ on 06/29/19 at 10:00 AM EST by a video enabled telemedicine application.  Interactive audio and video telecommunications were initially established beteen this provider and patient, however ultimately failed, due to patient having technical difficulties. We continued and completed visit with audio only  and verified that I am speaking with the correct person using two identifiersLocation patient: home Location provider: work or home office Persons participating in the virtual visit: patient, provider  I discussed the limitations, risks, security and privacy concerns of performing an evaluation and management service by telephone and the availability of in person appointments. I also discussed with the patient that there may be a patient responsible charge related to this service. The patient expressed understanding and agreed to proceed.   Reason for visit: hyperlipidemia   HPI:  69 yr old female with well controlled  Type 2 DM,  OSA, and hypertension ,  And hyperlipidemia treated with RYR due to patient preference, here to discuss the risks and advantages of statin therapy.    FRC risk calculation of > 30% discussed..   No supporting evidence of atherosclerosis from imaging studies. She has no family history of early CAD,  She is walking daily   following a careful diet and taking Red Yeast Rice twice daily    Despite knowledge of this risk , patient prefers to avoid statin therapy and continue Red Yeast Rice  Due to concerns about potential side effects she has  read about extensively      ROS: Patient denies headache, fevers, malaise, unintentional weight loss, skin rash, eye pain, sinus congestion and sinus pain, sore throat, dysphagia,  hemoptysis , cough, dyspnea, wheezing, chest pain, palpitations, orthopnea, edema, abdominal pain, nausea, melena, diarrhea, constipation, flank pain, dysuria, hematuria, urinary  Frequency, nocturia, numbness, tingling, seizures,  Focal weakness, Loss of consciousness,  Tremor, insomnia, depression, anxiety, and suicidal ideation.      Past Medical History:  Diagnosis Date  . Hypertension   . Obesity   . OSA (obstructive sleep apnea)    now off of CAPAP without symptoms     Past Surgical History:  Procedure Laterality Date  . ABDOMINAL HYSTERECTOMY    . BILATERAL OOPHORECTOMY  2007   [place]    Family History  Problem Relation Age of Onset  . Breast cancer Sister 35    SOCIAL HX:  reports that she has never smoked. She has never used smokeless tobacco. She reports current alcohol use. She reports that she does not use drugs.   Current Outpatient Medications:  .  amLODipine (NORVASC) 10 MG tablet, TAKE 1 TABLET BY MOUTH EVERY DAY, Disp: 90 tablet, Rfl: 1 .  glucose blood (ONETOUCH VERIO) test strip, USE AS DIRECTED EVERY DAY, Disp: 100 strip, Rfl: 5 .  metFORMIN (GLUCOPHAGE) 850 MG tablet, Take 1 tablet (850 mg total) by mouth daily with breakfast., Disp: 90 tablet, Rfl: 1 .  OneTouch Delica Lancets 50D MISC, USE AS DIRECTED EVERY DAY, Disp: 100 each, Rfl: 5  EXAM:   General impression: alert, cooperative and articulate.  No signs of being in  distress  Lungs: speech is fluent sentence length suggests that patient is not short of breath and not punctuated by cough, sneezing or sniffing. Marland Kitchen   Psych: affect normal.  speech is articulate and non pressured .  Denies suicidal thoughts   ASSESSMENT AND PLAN:  Discussed the following assessment and plan:  Hyperlipidemia associated with type 2  diabetes mellitus (HCC) - Plan: Ambulatory referral to Cardiology  Pure hypercholesterolemia  Hyperlipidemia She remains very apprehensive about using statins despite her elevated risk for events using the FRC.  She prefers to have non invasive evaluation for evidence of CAD before deciding .  Referral to Dr End  Discussed,  With patient very interested in having cardiac CT    I discussed the assessment and treatment plan with the patient. The patient was provided an opportunity to ask questions and all were answered. The patient agreed with the plan and demonstrated an understanding of the instructions.   The patient was advised to call back or seek an in-person evaluation if the symptoms worsen or if the condition fails to improve as anticipated.     Penny Shams, MD

## 2019-07-01 NOTE — Assessment & Plan Note (Addendum)
She remains very apprehensive about using statins despite her elevated risk for events using the FRC.  She prefers to have non invasive evaluation for evidence of CAD before deciding .  Referral to Dr End  Discussed,  With patient very interested in having cardiac CT

## 2019-07-05 ENCOUNTER — Encounter: Payer: Self-pay | Admitting: Internal Medicine

## 2019-07-05 NOTE — Progress Notes (Signed)
New Outpatient Visit Date: 07/06/2019  Referring Provider: Crecencio Mc, MD [address] [place],  [place] [address]  Chief Complaint: Elevated cholesterol  HPI:  Penny Butler is a 69 y.o. female who is being seen today for management of hyperlipidemia at the request of Dr. Derrel Nip. She has a history of hypertension, hyperlipidemia, type 2 diabetes mellitus, obstructive sleep apnea, and obesity. Statin therapy has been recommended by Dr. Derrel Nip, given multiple cardiac risk factors, though Penny Butler has been reluctant to proceed with this.  She is currently on red yeast rice with an LDL of 169 on last check in 03/2019 (drawn prior to beginning red yeast rice).  Penny Butler feels well, denying chest pain, shortness of breath, palpitations, lightheadedness, and edema.  She tries to walk at least 2 miles a day, which she does without difficulty.  She notes that she has a family history of CAD but is reluctant to begin a statin due to potential side effects.  She is tolerating red yeast rice well.  Dr. Derrel Nip brought up the possibility of coronary calcium scoring as a way to provide further risk stratification.  --------------------------------------------------------------------------------------------------  Cardiovascular History & Procedures: Cardiovascular Problems:  Hyperlipidemia  Risk Factors:  Hypertension, hyperlipidemia, type 2 diabetes mellitus, obesity, and age > 37  Cath/PCI:  None  CV Surgery:  None  EP Procedures and Devices:  None  Non-Invasive Evaluation(s):  None  Recent CV Pertinent Labs: Lab Results  Component Value Date   CHOL 237 (H) 04/10/2019   HDL 40.40 04/10/2019   LDLCALC 169 (H) 04/10/2019   LDLDIRECT 66.0 11/22/2018   TRIG 141.0 04/10/2019   CHOLHDL 6 04/10/2019   K 3.7 04/10/2019   MG 1.8 11/22/2018   BUN 18 04/10/2019   CREATININE 0.80 04/10/2019     --------------------------------------------------------------------------------------------------  Past Medical History:  Diagnosis Date  . Hyperlipidemia   . Hypertension   . Obesity   . OSA (obstructive sleep apnea)    now off of CAPAP without symptoms   . Type 2 diabetes mellitus ([place])     Past Surgical History:  Procedure Laterality Date  . ABDOMINAL HYSTERECTOMY    . BILATERAL OOPHORECTOMY  2007   [place]    Current Meds  Medication Sig  . amLODipine (NORVASC) 10 MG tablet TAKE 1 TABLET BY MOUTH EVERY DAY  . glucose blood (ONETOUCH VERIO) test strip USE AS DIRECTED EVERY DAY  . metFORMIN (GLUCOPHAGE) 850 MG tablet Take 1 tablet (850 mg total) by mouth daily with breakfast.  . OneTouch Delica Lancets 71I MISC USE AS DIRECTED EVERY DAY  . Red Yeast Rice 600 MG CAPS Take 1,200 mg by mouth daily. Takes 600mg  twice a day    Allergies: Citalopram and Codeine  Social History   Tobacco Use  . Smoking status: Never Smoker  . Smokeless tobacco: Never Used  Substance Use Topics  . Alcohol use: Not Currently    Comment: Few drinks a year  . Drug use: No    Family History  Problem Relation Age of Onset  . Breast cancer Sister 70  . Heart disease Mother   . Heart disease Father 24  . Lung cancer Father     Review of Systems: A 12-system review of systems was performed and was negative except as noted in the HPI.  --------------------------------------------------------------------------------------------------  Physical Exam: BP (!) 146/86 (BP Location: Right Arm, Patient Position: Sitting, Cuff Size: Normal)   Pulse 76   Ht 5\' 1"  (1.549 m)  Wt 180 lb 8 oz (81.9 kg)   SpO2 98%   BMI 34.11 kg/m   General: NAD. HEENT: No conjunctival pallor or scleral icterus. Facemask in place. Neck: Supple without lymphadenopathy, thyromegaly, JVD, or HJR. No carotid bruit. Lungs: Normal work of breathing. Clear to auscultation bilaterally without wheezes or  crackles. Heart: Regular rate and rhythm without murmurs, rubs, or gallops. Non-displaced PMI. Abd: Bowel sounds present. Soft, NT/ND without hepatosplenomegaly Ext: No lower extremity edema. Radial, PT, and DP pulses are 2+ bilaterally Skin: Warm and dry without rash. Neuro: CNIII-XII intact. Strength and fine-touch sensation intact in upper and lower extremities bilaterally. Psych: Normal mood and affect.  EKG: Normal sinus rhythm without abnormality.  Lab Results  Component Value Date   WBC 8.2 03/08/2018   HGB 13.1 03/08/2018   HCT 39.0 03/08/2018   MCV 83.9 03/08/2018   PLT 328.0 03/08/2018    Lab Results  Component Value Date   NA 141 04/10/2019   K 3.7 04/10/2019   CL 105 04/10/2019   CO2 27 04/10/2019   BUN 18 04/10/2019   CREATININE 0.80 04/10/2019   GLUCOSE 116 (H) 04/10/2019   ALT 19 04/10/2019    Lab Results  Component Value Date   CHOL 237 (H) 04/10/2019   HDL 40.40 04/10/2019   LDLCALC 169 (H) 04/10/2019   LDLDIRECT 66.0 11/22/2018   TRIG 141.0 04/10/2019   CHOLHDL 6 04/10/2019     --------------------------------------------------------------------------------------------------  ASSESSMENT AND PLAN: Hyperlipidemia: Penny Butler has a history of hyperlipidemia with LDL noted to be 169 on last check in October.  Given her multiple risk factors including diabetes, hypertension, hyperlipidemia, and family history of coronary artery disease, I think that she would benefit from statin therapy.  Additionally, I have personally reviewed images from renal stone CT dated 02/22/2018, which demonstrate scattered atherosclerotic calcifications in the abdominal aorta and iliac arteries, consistent with atherosclerosis.  We discussed the utility of coronary calcium score for further risk stratification.  I advised her that it may not be covered by her insurance and would only be of utility if it were to alter her decision to proceed with lipid therapy.  In light of this, [name] has agreed to consider initiation of low-dose rosuvastatin in place of red yeast rice, if her LDL remains above goal.  We will therefore check a lipid panel and ALT today with the expectation that low-dose rosuvastatin (likely 5 mg weekly) will be started.  Penny Butler wishes to continue following with Dr. Darrick Butler for management of her hyperlipidemia.  Hypertension: Blood pressure mildly elevated today (goal less than 130/80).  I have stressed the importance of sodium restriction and continued exercise.  I will defer medication changes today.  Penny Butler should continue to follow with Dr. Darrick Butler.  Follow-up: Return to clinic as needed.  Penny Kendall, MD 07/06/2019 7:37 PM

## 2019-07-06 ENCOUNTER — Other Ambulatory Visit: Payer: Self-pay

## 2019-07-06 ENCOUNTER — Ambulatory Visit (INDEPENDENT_AMBULATORY_CARE_PROVIDER_SITE_OTHER): Payer: Medicare HMO | Admitting: Internal Medicine

## 2019-07-06 ENCOUNTER — Encounter: Payer: Self-pay | Admitting: Internal Medicine

## 2019-07-06 VITALS — BP 146/86 | HR 76 | Ht 61.0 in | Wt 180.5 lb

## 2019-07-06 DIAGNOSIS — I10 Essential (primary) hypertension: Secondary | ICD-10-CM

## 2019-07-06 DIAGNOSIS — E7800 Pure hypercholesterolemia, unspecified: Secondary | ICD-10-CM

## 2019-07-06 NOTE — Patient Instructions (Signed)
Medication Instructions:  Your physician recommends that you continue on your current medications as directed. Please refer to the Current Medication list given to you today.  *If you need a refill on your cardiac medications before your next appointment, please call your pharmacy*  Lab Work: Your physician recommends that you return for lab work in: TODAY - LIPID, ALT.  If you have labs (blood work) drawn today and your tests are completely normal, you will receive your results only by: Marland Kitchen MyChart Message (if you have MyChart) OR . A paper copy in the mail If you have any lab test that is abnormal or we need to change your treatment, we will call you to review the results.  Testing/Procedures: none  Follow-Up: At [facility], you and your health needs are our priority.  As part of our continuing mission to provide you with exceptional heart care, we have created designated Provider Care Teams.  These Care Teams include your primary Cardiologist (physician) and Advanced Practice Providers (APPs -  Physician Assistants and Nurse Practitioners) who all work together to provide you with the care you need, when you need it.  Your next appointment:   As needed.   The format for your next appointment:   In Person  Provider:    You may see DR Cristal Deer END or one of the following Advanced Practice Providers on your designated Care Team:    Nicolasa Ducking, NP  Eula Listen, PA-C  Marisue Ivan, PA-C

## 2019-07-07 ENCOUNTER — Other Ambulatory Visit: Payer: Self-pay | Admitting: Internal Medicine

## 2019-07-07 DIAGNOSIS — E7800 Pure hypercholesterolemia, unspecified: Secondary | ICD-10-CM

## 2019-07-07 DIAGNOSIS — E119 Type 2 diabetes mellitus without complications: Secondary | ICD-10-CM

## 2019-07-07 LAB — ALT: ALT: 14 IU/L (ref 0–32)

## 2019-07-07 LAB — LIPID PANEL
Chol/HDL Ratio: 5.4 ratio — ABNORMAL HIGH (ref 0.0–4.4)
Cholesterol, Total: 214 mg/dL — ABNORMAL HIGH (ref 100–199)
HDL: 40 mg/dL (ref >39)
LDL Chol Calc (NIH): 130 mg/dL — ABNORMAL HIGH (ref 0–99)
Triglycerides: 245 mg/dL — ABNORMAL HIGH (ref 0–149)
VLDL Cholesterol Cal: 44 mg/dL — ABNORMAL HIGH (ref 5–40)

## 2019-07-09 ENCOUNTER — Telehealth: Payer: Self-pay | Admitting: *Deleted

## 2019-07-09 NOTE — Telephone Encounter (Signed)
-----   Message from Yvonne Kendall, MD sent at 07/07/2019  8:36 PM EST ----- LDL has improved but is still above goal at 130.  Triglycerides are nonspecific given nonfasting sample.  I recommend starting rosuvastatin 5 mg weekly and discontinuation of red yeast rice.  Ms. Ullery will need a fasting lipid panel and ALT in ~3 months, which she wishes to have done through Dr. Melina Schools office.  I will forward this to Dr. Darrick Huntsman for her records as well.

## 2019-07-09 NOTE — Telephone Encounter (Signed)
Patient verbalized understanding of the results and Dr Serita Kyle recommendations. She would like to hold off on starting the rosuvastatin at this time and follow up with Dr Darrick Huntsman for further recommendations. Advised I would make Dr Darrick Huntsman aware. Patient advised to follow up with PCP.

## 2019-07-18 DIAGNOSIS — I10 Essential (primary) hypertension: Secondary | ICD-10-CM | POA: Diagnosis not present

## 2019-07-18 DIAGNOSIS — G4733 Obstructive sleep apnea (adult) (pediatric): Secondary | ICD-10-CM | POA: Diagnosis not present

## 2019-07-26 ENCOUNTER — Other Ambulatory Visit: Payer: Self-pay

## 2019-07-26 ENCOUNTER — Ambulatory Visit (INDEPENDENT_AMBULATORY_CARE_PROVIDER_SITE_OTHER): Payer: Medicare HMO

## 2019-07-26 VITALS — Ht 61.0 in | Wt 180.0 lb

## 2019-07-26 DIAGNOSIS — Z0000 Encounter for general adult medical examination without abnormal findings: Secondary | ICD-10-CM | POA: Diagnosis not present

## 2019-07-26 DIAGNOSIS — Z780 Asymptomatic menopausal state: Secondary | ICD-10-CM

## 2019-07-26 NOTE — Patient Instructions (Addendum)
  Ms. Jhaveri , Thank you for taking time to come for your Medicare Wellness Visit. I appreciate your ongoing commitment to your health goals. Please review the following plan we discussed and let me know if I can assist you in the future.   These are the goals we discussed: Goals    . Follow up with Primary Care Provider     Routine maintenance       This is a list of the screening recommended for you and due dates:  Health Maintenance  Topic Date Due  . DEXA scan (bone density measurement)  06/12/2018  . Flu Shot  09/19/2019*  . Complete foot exam   12/04/2019*  . Eye exam for diabetics  12/04/2019*  . Tetanus Vaccine  07/25/2020*  . Pneumonia vaccines (1 of 2 - PCV13) 07/25/2020*  . Hemoglobin A1C  10/09/2019  . Urine Protein Check  05/13/2020  . Mammogram  03/01/2021  . Colon Cancer Screening  12/25/2023  .  Hepatitis C: One time screening is recommended by Center for Disease Control  (CDC) for  adults born from 18 through 1963.   Completed  *Topic was postponed. The date shown is not the original due date.

## 2019-07-26 NOTE — Progress Notes (Signed)
Subjective:   Penny Butler is a 69 y.o. female who presents for an Initial Medicare Annual Wellness Visit.  Review of Systems    No ROS.  Medicare Wellness Virtual Visit.  Visual/audio telehealth visit, UTA vital signs. Ht/Wt provided.    See social history for additional risk factors.    Cardiac Risk Factors include: advanced age (>29men, >80 women);diabetes mellitus;hypertension     Objective:    Today's Vitals   07/26/19 1309  Weight: 180 lb (81.6 kg)  Height: 5\' 1"  (1.549 m)   Body mass index is 34.01 kg/m.  Advanced Directives 07/26/2019  Does Patient Have a Medical Advance Directive? No  Would patient like information on creating a medical advance directive? No - Patient declined    Current Medications (verified) Outpatient Encounter Medications as of 07/26/2019  Medication Sig  . amLODipine (NORVASC) 10 MG tablet TAKE 1 TABLET BY MOUTH EVERY DAY  . glucose blood (ONETOUCH VERIO) test strip USE AS DIRECTED EVERY DAY  . metFORMIN (GLUCOPHAGE) 850 MG tablet Take 1 tablet (850 mg total) by mouth daily with breakfast.  . OneTouch Delica Lancets 30G MISC USE AS DIRECTED EVERY DAY  . Red Yeast Rice 600 MG CAPS Take 1,200 mg by mouth daily. Takes 600mg  twice a day   No facility-administered encounter medications on file as of 07/26/2019.    Allergies (verified) Citalopram and Codeine   History: Past Medical History:  Diagnosis Date  . Hyperlipidemia   . Hypertension   . Obesity   . OSA (obstructive sleep apnea)    now off of CAPAP without symptoms   . Type 2 diabetes mellitus (HCC)    Past Surgical History:  Procedure Laterality Date  . ABDOMINAL HYSTERECTOMY    . BILATERAL OOPHORECTOMY  2007   [place]   Family History  Problem Relation Age of Onset  . Breast cancer Sister 87  . Heart disease Mother   . Heart disease Father 51  . Lung cancer Father    Social History   Socioeconomic History  . Marital status: Married    Spouse name: Not on  file  . Number of children: Not on file  . Years of education: Not on file  . Highest education level: Not on file  Occupational History  . Not on file  Tobacco Use  . Smoking status: Never Smoker  . Smokeless tobacco: Never Used  Substance and Sexual Activity  . Alcohol use: Not Currently    Comment: Few drinks a year  . Drug use: No  . Sexual activity: Yes  Other Topics Concern  . Not on file  Social History Narrative  . Not on file   Social Determinants of Health   Financial Resource Strain: Low Risk   . Difficulty of Paying Living Expenses: Not hard at all  Food Insecurity: No Food Insecurity  . Worried About 66 in the Last Year: Never true  . Ran Out of Food in the Last Year: Never true  Transportation Needs: No Transportation Needs  . Lack of Transportation (Medical): No  . Lack of Transportation (Non-Medical): No  Physical Activity: Sufficiently Active  . Days of Exercise per Week: 7 days  . Minutes of Exercise per Session: 30 min  Stress: No Stress Concern Present  . Feeling of Stress : Only a little  Social Connections:   . Frequency of Communication with Friends and Family: Not on file  . Frequency of Social Gatherings with Friends and Family: Not  on file  . Attends Religious Services: Not on file  . Active Member of Clubs or Organizations: Not on file  . Attends Banker Meetings: Not on file  . Marital Status: Not on file    Tobacco Counseling Counseling given: Not Answered   Clinical Intake:  Pre-visit preparation completed: Yes        Diabetes: Yes(Followed by pcp)  How often do you need to have someone help you when you read instructions, pamphlets, or other written materials from your doctor or pharmacy?: 1 - Never  Interpreter Needed?: No      Activities of Daily Living In your present state of health, do you have any difficulty performing the following activities: 07/26/2019  Hearing? N  Vision? N    Difficulty concentrating or making decisions? N  Walking or climbing stairs? N  Dressing or bathing? N  Doing errands, shopping? N  Preparing Food and eating ? N  Using the Toilet? N  In the past six months, have you accidently leaked urine? N  Do you have problems with loss of bowel control? N  Managing your Medications? N  Managing your Finances? N  Housekeeping or managing your Housekeeping? N  Some recent data might be hidden     Immunizations and Health Maintenance Immunization History  Administered Date(s) Administered  . Zoster Recombinat (Shingrix) 07/26/2018, 12/15/2018   Health Maintenance Due  Topic Date Due  . DEXA SCAN  06/12/2018    Patient Care Team: Sherlene Shams, MD as PCP - General (Internal Medicine)  Indicate any recent Medical Services you may have received from other than [facility] providers in the past year (date may be approximate).     Assessment:   This is a routine wellness examination for Penny Butler.  Nurse connected with patient 07/26/19 at  1:00 PM EST by a telephone enabled telemedicine application and verified that I am speaking with the correct person using two identifiers. Patient stated full name and DOB. Patient gave permission to continue with virtual visit. Patient's location was at home and Nurse's location was at [place] office.   Patient is alert and oriented x3. Patient denies difficulty focusing or concentrating. Patient likes to stay active, read and attends online zoom meetings which assist with brain stimulation.   Health Maintenance Due: -PNA and Tdap vaccine- discussed; to be completed with doctor in visit or local pharmacy.  -Dexa Scan- ordered per patient request. -Foot Exam- followed by pcp. -Hgb A1c- 04/10/19 (5.9) See completed HM at the end of note.   Eye: Visual acuity not assessed. Virtual visit. Followed by their ophthalmologist. Retinopathy- none reported.  Dental: Visits every 6 months.     Hearing: Demonstrates normal hearing during visit.  Safety:  Patient feels safe at home- yes Patient does have smoke detectors at home- yes Patient does wear sunscreen or protective clothing when in direct sunlight - yes Patient does wear seat belt when in a moving vehicle - yes Patient drives- yes Adequate lighting in walkways free from debris- yes Grab bars and handrails used as appropriate- yes Ambulates with an assistive device- no Cell phone on person when ambulating outside of the home-  yes  Social: Alcohol intake - no  Smoking history- never  Smokers in home? none Illicit drug use? none  Medication: Taking as directed and without issues.  Self managed - yes  Covid-19: Precautions and sickness symptoms discussed. Wears mask, social distancing, hand hygiene as appropriate.   Activities of Daily Living Patient denies  needing assistance with: household chores, feeding themselves, getting from bed to chair, getting to the toilet, bathing/showering, dressing, managing money, or preparing meals.   Discussed the importance of a healthy diet, water intake and the benefits of aerobic exercise.   Physical activity- walking daily 2 miles daily  Diet:  Well balanced; healthy, low carb Water: very good intake  Other Providers Patient Care Team: Crecencio Mc, MD as PCP - General (Internal Medicine) Hearing/Vision screen  Hearing Screening   125Hz  250Hz  500Hz  1000Hz  2000Hz  3000Hz  4000Hz  6000Hz  8000Hz   Right ear:           Left ear:           Comments: Patient is able to hear conversational tones without difficulty.  No issues reported.   Vision Screening Comments: Visual acuity not assessed, virtual visit.      Dietary issues and exercise activities discussed: Current Exercise Habits: Home exercise routine, Type of exercise: walking, Intensity: Moderate  Goals    . Follow up with Primary Care Provider     Routine maintenance      Depression Screen PHQ 2/9  Scores 07/26/2019 01/16/2018  PHQ - 2 Score 0 0  PHQ- 9 Score - 0    Fall Risk Fall Risk  07/26/2019 06/29/2019 04/23/2019  Falls in the past year? 0 0 0  Follow up Falls evaluation completed Falls evaluation completed Falls evaluation completed    Timed Get Up and Go Performed no, virtual visit  Cognitive Function:     6CIT Screen 07/26/2019  What Year? 0 points  What month? 0 points  What time? 0 points    Screening Tests Health Maintenance  Topic Date Due  . DEXA SCAN  06/12/2018  . INFLUENZA VACCINE  09/19/2019 (Originally 01/20/2019)  . FOOT EXAM  12/04/2019 (Originally 06/13/1963)  . OPHTHALMOLOGY EXAM  12/04/2019 (Originally 06/13/1963)  . TETANUS/TDAP  07/25/2020 (Originally 06/12/1972)  . PNA vac Low Risk Adult (1 of 2 - PCV13) 07/25/2020 (Originally 06/12/2018)  . HEMOGLOBIN A1C  10/09/2019  . URINE MICROALBUMIN  05/13/2020  . MAMMOGRAM  03/01/2021  . COLONOSCOPY  12/25/2023  . Hepatitis C Screening  Completed     Plan:   Keep all routine maintenance appointments.   Medicare Attestation I have personally reviewed: The patient's medical and social history Their use of alcohol, tobacco or illicit drugs Their current medications and supplements The patient's functional ability including ADLs,fall risks, home safety risks, cognitive, and hearing and visual impairment Diet and physical activities Evidence for depression   I have reviewed and discussed with patient certain preventive protocols, quality metrics, and best practice recommendations.     Varney Biles, LPN   6/0/1093

## 2019-08-18 DIAGNOSIS — I10 Essential (primary) hypertension: Secondary | ICD-10-CM | POA: Diagnosis not present

## 2019-08-18 DIAGNOSIS — G4733 Obstructive sleep apnea (adult) (pediatric): Secondary | ICD-10-CM | POA: Diagnosis not present

## 2019-08-23 ENCOUNTER — Other Ambulatory Visit: Payer: Self-pay | Admitting: Internal Medicine

## 2019-09-01 DIAGNOSIS — R69 Illness, unspecified: Secondary | ICD-10-CM | POA: Diagnosis not present

## 2019-09-03 DIAGNOSIS — R69 Illness, unspecified: Secondary | ICD-10-CM | POA: Diagnosis not present

## 2019-09-11 ENCOUNTER — Encounter: Payer: Self-pay | Admitting: Internal Medicine

## 2019-09-15 DIAGNOSIS — I10 Essential (primary) hypertension: Secondary | ICD-10-CM | POA: Diagnosis not present

## 2019-09-15 DIAGNOSIS — G4733 Obstructive sleep apnea (adult) (pediatric): Secondary | ICD-10-CM | POA: Diagnosis not present

## 2019-09-27 ENCOUNTER — Ambulatory Visit
Admission: RE | Admit: 2019-09-27 | Discharge: 2019-09-27 | Disposition: A | Discharge status: Home / Self Care | Payer: Medicare HMO | Source: Ambulatory Visit | Attending: Internal Medicine | Admitting: Internal Medicine

## 2019-09-27 DIAGNOSIS — E2839 Other primary ovarian failure: Secondary | ICD-10-CM | POA: Diagnosis not present

## 2019-09-27 DIAGNOSIS — Z780 Asymptomatic menopausal state: Secondary | ICD-10-CM

## 2019-10-02 DIAGNOSIS — Z03818 Encounter for observation for suspected exposure to other biological agents ruled out: Secondary | ICD-10-CM | POA: Diagnosis not present

## 2019-10-02 DIAGNOSIS — Z20828 Contact with and (suspected) exposure to other viral communicable diseases: Secondary | ICD-10-CM | POA: Diagnosis not present

## 2019-10-16 DIAGNOSIS — I10 Essential (primary) hypertension: Secondary | ICD-10-CM | POA: Diagnosis not present

## 2019-10-16 DIAGNOSIS — G4733 Obstructive sleep apnea (adult) (pediatric): Secondary | ICD-10-CM | POA: Diagnosis not present

## 2019-11-15 DIAGNOSIS — I10 Essential (primary) hypertension: Secondary | ICD-10-CM | POA: Diagnosis not present

## 2019-11-15 DIAGNOSIS — G4733 Obstructive sleep apnea (adult) (pediatric): Secondary | ICD-10-CM | POA: Diagnosis not present

## 2019-11-29 DIAGNOSIS — R69 Illness, unspecified: Secondary | ICD-10-CM | POA: Diagnosis not present

## 2019-12-06 DIAGNOSIS — R69 Illness, unspecified: Secondary | ICD-10-CM | POA: Diagnosis not present

## 2019-12-16 DIAGNOSIS — I10 Essential (primary) hypertension: Secondary | ICD-10-CM | POA: Diagnosis not present

## 2019-12-16 DIAGNOSIS — G4733 Obstructive sleep apnea (adult) (pediatric): Secondary | ICD-10-CM | POA: Diagnosis not present

## 2019-12-31 ENCOUNTER — Telehealth: Payer: Self-pay | Admitting: Internal Medicine

## 2019-12-31 NOTE — Telephone Encounter (Signed)
Spoke with pt and she has been scheduled for an in office appt on Wednesday at 2:30. Pt is aware of appt date and time.

## 2019-12-31 NOTE — Telephone Encounter (Signed)
Pt has a lump right down below the breast on the right side and is  very sore want to know if she needs to come in or schedule a mammogram.

## 2020-01-02 ENCOUNTER — Ambulatory Visit: Payer: Medicare HMO | Admitting: Internal Medicine

## 2020-01-03 ENCOUNTER — Other Ambulatory Visit: Payer: Self-pay

## 2020-01-03 ENCOUNTER — Encounter: Payer: Self-pay | Admitting: Internal Medicine

## 2020-01-03 ENCOUNTER — Ambulatory Visit (INDEPENDENT_AMBULATORY_CARE_PROVIDER_SITE_OTHER): Payer: Medicare HMO | Admitting: Internal Medicine

## 2020-01-03 VITALS — BP 124/86 | HR 88 | Temp 97.8°F | Resp 15 | Ht 61.0 in | Wt 193.4 lb

## 2020-01-03 DIAGNOSIS — E559 Vitamin D deficiency, unspecified: Secondary | ICD-10-CM | POA: Diagnosis not present

## 2020-01-03 DIAGNOSIS — N63 Unspecified lump in unspecified breast: Secondary | ICD-10-CM

## 2020-01-03 DIAGNOSIS — R69 Illness, unspecified: Secondary | ICD-10-CM | POA: Diagnosis not present

## 2020-01-03 DIAGNOSIS — F4321 Adjustment disorder with depressed mood: Secondary | ICD-10-CM | POA: Diagnosis not present

## 2020-01-03 DIAGNOSIS — I10 Essential (primary) hypertension: Secondary | ICD-10-CM | POA: Diagnosis not present

## 2020-01-03 DIAGNOSIS — E7800 Pure hypercholesterolemia, unspecified: Secondary | ICD-10-CM

## 2020-01-03 NOTE — Assessment & Plan Note (Signed)
Her mother died in May of metastatic lung Ca.  She experienced elevated blood pressure for several weeks during her decline  And increased her amlodipine dose transiently.  Patient is dealing with the loss of her mother  and has adequate coping skills and emotional support .  i have asked patient to return in September for follow up

## 2020-01-03 NOTE — Assessment & Plan Note (Signed)
Well controlled on current regimen, no changes today. 

## 2020-01-03 NOTE — Progress Notes (Signed)
Subjective:  Patient ID: [name], female    DOB: 09-29-1950  Age: 69 y.o. MRN: [id number]  CC: The primary encounter diagnosis was Breast lump or mass. Diagnoses of Essential hypertension, Pure hypercholesterolemia, and Grief reaction were also pertinent to this visit.  HPI SHANNAH CONTEH presents for evaluation of right breast pain  This visit occurred during the SARS-CoV-2 public health emergency.  Safety protocols were in place, including screening questions prior to the visit, additional usage of staff PPE, and extensive cleaning of exam room while observing appropriate contact time as indicated for disinfecting solutions.   Right lateral breast region has been "sore" for several weeks.  Associates it was hormonal fluctuations,  However, Starting on Sunday felt a small lump in axilla, that was tender . No longer tender but still palpable.  No history of scalp head or neck infection.  Did receive the J&J vaccine on 28-Dec-2022 Last mammogram Sept 2020 noted an asymmetry in the right breast and patient was advised to have additional imaging which was done and revealed no masses.    She has a family histroy of breast cancer.   J &J vaccine  Received 12-28-22.   Mother died in 12/05/22 of metasatic  lung ca   Walking g daily,  Taking red yeast rice     Outpatient Medications Prior to Visit  Medication Sig Dispense Refill  . amLODipine (NORVASC) 10 MG tablet TAKE 1 TABLET BY MOUTH EVERY DAY 90 tablet 1  . glucose blood (ONETOUCH VERIO) test strip USE AS DIRECTED EVERY DAY 100 strip 5  . OneTouch Delica Lancets 30G MISC USE AS DIRECTED EVERY DAY 100 each 5  . Red Yeast Rice 600 MG CAPS Take 1,200 mg by mouth daily. Takes 600mg  twice a day    . metFORMIN (GLUCOPHAGE) 850 MG tablet Take 1 tablet (850 mg total) by mouth daily with breakfast. (Patient not taking: Reported on 01/03/2020) 90 tablet 1   No facility-administered medications prior to visit.    Review of Systems;  Patient  denies headache, fevers, malaise, unintentional weight loss, skin rash, eye pain, sinus congestion and sinus pain, sore throat, dysphagia,  hemoptysis , cough, dyspnea, wheezing, chest pain, palpitations, orthopnea, edema, abdominal pain, nausea, melena, diarrhea, constipation, flank pain, dysuria, hematuria, urinary  Frequency, nocturia, numbness, tingling, seizures,  Focal weakness, Loss of consciousness,  Tremor, insomnia, depression, anxiety, and suicidal ideation.      Objective:  BP 124/86 (BP Location: Left Arm, Patient Position: Sitting, Cuff Size: Normal)   Pulse 88   Temp 97.8 F (36.6 C) (Temporal)   Resp 15   Ht 5\' 1"  (1.549 m)   Wt 193 lb 6.4 oz (87.7 kg)   SpO2 98%   BMI 36.54 kg/m   BP Readings from Last 3 Encounters:  01/03/20 124/86  07/06/19 (!) 146/86  03/08/18 (!) 142/80    Wt Readings from Last 3 Encounters:  01/03/20 193 lb 6.4 oz (87.7 kg)  07/26/19 180 lb (81.6 kg)  07/06/19 180 lb 8 oz (81.9 kg)    General appearance: alert, cooperative and appears stated age Ears: normal TM's and external ear canals both ears Throat: lips, mucosa, and tongue normal; teeth and gums normal Neck: no adenopathy, no carotid bruit, supple, symmetrical, trachea midline and thyroid not enlarged, symmetric, no tenderness/mass/nodules Breast:  Small nontender pea sized mass in Right axilla at edge of pectoralis muscle.  Back: symmetric, no curvature. ROM normal. No CVA tenderness. Lungs: clear to  auscultation bilaterally Heart: regular rate and rhythm, S1, S2 normal, no murmur, click, rub or gallop Abdomen: soft, non-tender; bowel sounds normal; no masses,  no organomegaly Pulses: 2+ and symmetric Skin: Skin color, texture, turgor normal. No rashes or lesions Lymph nodes: Cervical, supraclavicular, and axillary nodes normal.  Lab Results  Component Value Date   HGBA1C 5.9 04/10/2019   HGBA1C 16.1 (H) 11/22/2018   HGBA1C 5.9 01/16/2018    Lab Results  Component Value  Date   CREATININE 0.80 04/10/2019   CREATININE 0.78 11/22/2018   CREATININE 1.08 03/08/2018    Lab Results  Component Value Date   WBC 8.2 03/08/2018   HGB 13.1 03/08/2018   HCT 39.0 03/08/2018   PLT 328.0 03/08/2018   GLUCOSE 116 (H) 04/10/2019   CHOL 214 (H) 07/06/2019   TRIG 245 (H) 07/06/2019   HDL 40 07/06/2019   LDLDIRECT 66.0 11/22/2018   LDLCALC 130 (H) 07/06/2019   ALT 14 07/06/2019   AST 15 04/10/2019   NA 141 04/10/2019   K 3.7 04/10/2019   CL 105 04/10/2019   CREATININE 0.80 04/10/2019   BUN 18 04/10/2019   CO2 27 04/10/2019   TSH 1.87 11/22/2018   HGBA1C 5.9 04/10/2019   MICROALBUR 1.6 12/10/2016    DEXAScan  Result Date: 09/27/2019 EXAM: DUAL X-RAY ABSORPTIOMETRY (DXA) FOR BONE MINERAL DENSITY IMPRESSION: Your patient Kasi Lasky completed a BMD test on 09/27/2019 using the [employer] iDXA DXA System (software version: 14.10) manufactured by [employer]. The following summarizes the results of our evaluation. Technologist: MTB PATIENT BIOGRAPHICAL: Name: Nya, Monds I Patient ID: [id number] Birth Date: 1950-09-28 Height: 61.0 in. Gender: Female Exam Date: 09/27/2019 Weight: 183.0 lbs. Indications: Diabetic, Hysterectomy, Oophorectomy Bilateral, Postmenopausal Fractures: Treatments: Calcium, Metformin, Vitamin D DENSITOMETRY RESULTS: Site         Region     Measured Date Measured Age WHO Classification Young Adult T-score BMD         %Change vs. Previous Significant Change (*) AP Spine L1-L4 (L3) 09/27/2019 66.2 Normal -0.2 1.163 g/cm2 - - DualFemur Neck Left 09/27/2019 66.2 Normal -1.0 0.904 g/cm2 - - DualFemur Total Mean 09/27/2019 66.2 Normal 0.0 1.012 g/cm2 - - Left Forearm Radius 33% 09/27/2019 66.2 Normal -0.9 0.801 g/cm2 - - ASSESSMENT: The BMD measured at Femur Neck Left is 0.904 g/cm2 with a T-score of -1.0. This patient is considered normal according to World Health Organization [facility]) criteria. The scan quality is good. L-3 was excluded due to  degenerative changes. World Science writer [facility]) criteria for post-menopausal, Caucasian Women: Normal:                   T-score at or above -1 SD Osteopenia/low bone mass: T-score between -1 and -2.5 SD Osteoporosis:             T-score at or below -2.5 SD RECOMMENDATIONS: 1. All patients should optimize calcium and vitamin D intake. 2. Consider FDA-approved medical therapies in postmenopausal women and men aged 38 years and older, based on the following: a. A hip or vertebral(clinical or morphometric) fracture b. T-score < -2.5 at the femoral neck or spine after appropriate evaluation to exclude secondary causes c. Low bone mass (T-score between -1.0 and -2.5 at the femoral neck or spine) and a 10-year probability of a hip fracture > 3% or a 10-year probability of a major osteoporosis-related fracture > 20% based on the [place]-adapted WHO algorithm 3. Clinician judgment and/or patient preferences may indicate treatment for people  with 10-year fracture probabilities above or below these levels FOLLOW-UP: People with diagnosed cases of osteoporosis or at high risk for fracture should have regular bone mineral density tests. For patients eligible for Medicare, routine testing is allowed once every 2 years. The testing frequency can be increased to one year for patients who have rapidly progressing disease, those who are receiving or discontinuing medical therapy to restore bone mass, or have additional risk factors. I have reviewed this report, and agree with the above findings. [facility] Electronically Signed   By: Charlett Nose M.D.   On: 09/27/2019 15:57    Assessment & Plan:   Problem List Items Addressed This Visit      Unprioritized   Hyperlipidemia    She has deferred statin therapy and is taking red yeast rice  1200 mg daily .        Hypertension    Well controlled on current regimen., no changes today.      Breast lump or mass - Primary    Right breast,  With tenderness.   Diagnostic mammo and ultrasound ordered       Relevant Orders   MM Digital Diagnostic Bilat   US BREAST COMPLETE UNI RIGHT INC AXILLA   Grief reaction    Her mother died in 11-26-2022 of metastatic lung Ca.  She experienced elevated blood pressure for several weeks during her decline  And increased her amlodipine dose transiently.  Patient is dealing with the loss of her mother  and has adequate coping skills and emotional support .  i have asked patient to return in September for follow up         I have discontinued Vikki Ports I. Tamargo's metFORMIN. I am also having her maintain her OneTouch Verio, OneTouch Delica Lancets 30G, Red Yeast Rice, and amLODipine.  No orders of the defined types were placed in this encounter.   Medications Discontinued During This Encounter  Medication Reason  . metFORMIN (GLUCOPHAGE) 850 MG tablet Patient Preference    Follow-up: No follow-ups on file.   Sherlene Shams, MD

## 2020-01-03 NOTE — Patient Instructions (Signed)
I have ordered a diagnostic mammogram and ultrasound of the right breast  The office will call you to set this up   We should repeat your labs in September/October (fasting) after you have changed the way you are taking red yeast rice

## 2020-01-03 NOTE — Assessment & Plan Note (Signed)
Right breast,  With tenderness.  Diagnostic mammo and ultrasound ordered

## 2020-01-03 NOTE — Assessment & Plan Note (Signed)
She has deferred statin therapy and is taking red yeast rice  1200 mg daily .

## 2020-01-07 ENCOUNTER — Telehealth: Payer: Self-pay

## 2020-01-07 DIAGNOSIS — N63 Unspecified lump in unspecified breast: Secondary | ICD-10-CM

## 2020-01-07 NOTE — Telephone Encounter (Signed)
Imaging orders corrected

## 2020-01-11 ENCOUNTER — Other Ambulatory Visit: Payer: Self-pay | Admitting: Internal Medicine

## 2020-01-11 ENCOUNTER — Ambulatory Visit
Admission: RE | Admit: 2020-01-11 | Discharge: 2020-01-11 | Disposition: A | Discharge status: Home / Self Care | Payer: Medicare HMO | Source: Ambulatory Visit | Attending: Internal Medicine | Admitting: Internal Medicine

## 2020-01-11 DIAGNOSIS — N6310 Unspecified lump in the right breast, unspecified quadrant: Secondary | ICD-10-CM | POA: Diagnosis not present

## 2020-01-11 DIAGNOSIS — N63 Unspecified lump in unspecified breast: Secondary | ICD-10-CM

## 2020-01-11 DIAGNOSIS — N644 Mastodynia: Secondary | ICD-10-CM | POA: Diagnosis not present

## 2020-01-11 DIAGNOSIS — R928 Other abnormal and inconclusive findings on diagnostic imaging of breast: Secondary | ICD-10-CM | POA: Diagnosis not present

## 2020-01-15 DIAGNOSIS — I10 Essential (primary) hypertension: Secondary | ICD-10-CM | POA: Diagnosis not present

## 2020-01-15 DIAGNOSIS — G4733 Obstructive sleep apnea (adult) (pediatric): Secondary | ICD-10-CM | POA: Diagnosis not present

## 2020-01-18 ENCOUNTER — Other Ambulatory Visit: Payer: Medicare HMO

## 2020-02-01 DIAGNOSIS — Z20822 Contact with and (suspected) exposure to covid-19: Secondary | ICD-10-CM | POA: Diagnosis not present

## 2020-02-15 DIAGNOSIS — I10 Essential (primary) hypertension: Secondary | ICD-10-CM | POA: Diagnosis not present

## 2020-02-15 DIAGNOSIS — G4733 Obstructive sleep apnea (adult) (pediatric): Secondary | ICD-10-CM | POA: Diagnosis not present

## 2020-02-22 DIAGNOSIS — I10 Essential (primary) hypertension: Secondary | ICD-10-CM | POA: Diagnosis not present

## 2020-02-22 DIAGNOSIS — G4733 Obstructive sleep apnea (adult) (pediatric): Secondary | ICD-10-CM | POA: Diagnosis not present

## 2020-02-29 ENCOUNTER — Telehealth: Payer: Self-pay | Admitting: Internal Medicine

## 2020-02-29 NOTE — Telephone Encounter (Signed)
I called pt to inform her of [place] of breast appt no vm. I sent Mychart msg.

## 2020-02-29 NOTE — Addendum Note (Signed)
Addended by: Sherlene Shams on: 02/29/2020 10:06 AM   Modules accepted: Orders

## 2020-03-03 ENCOUNTER — Other Ambulatory Visit (INDEPENDENT_AMBULATORY_CARE_PROVIDER_SITE_OTHER): Payer: Medicare HMO

## 2020-03-03 ENCOUNTER — Other Ambulatory Visit: Payer: Self-pay

## 2020-03-03 DIAGNOSIS — E119 Type 2 diabetes mellitus without complications: Secondary | ICD-10-CM

## 2020-03-03 DIAGNOSIS — E7800 Pure hypercholesterolemia, unspecified: Secondary | ICD-10-CM

## 2020-03-03 DIAGNOSIS — E559 Vitamin D deficiency, unspecified: Secondary | ICD-10-CM

## 2020-03-03 LAB — LIPID PANEL
Cholesterol: 192 mg/dL (ref 0–200)
HDL: 38.3 mg/dL — ABNORMAL LOW (ref >39.00)
LDL Cholesterol: 124 mg/dL — ABNORMAL HIGH (ref 0–99)
NonHDL: 153.33
Total CHOL/HDL Ratio: 5
Triglycerides: 145 mg/dL (ref 0.0–149.0)
VLDL: 29 mg/dL (ref 0.0–40.0)

## 2020-03-03 LAB — HEMOGLOBIN A1C: Hgb A1c MFr Bld: 7.1 % — ABNORMAL HIGH (ref 4.6–6.5)

## 2020-03-03 LAB — COMPREHENSIVE METABOLIC PANEL
ALT: 20 U/L (ref 0–35)
AST: 15 U/L (ref 0–37)
Albumin: 4.2 g/dL (ref 3.5–5.2)
Alkaline Phosphatase: 90 U/L (ref 39–117)
BUN: 19 mg/dL (ref 6–23)
CO2: 26 mEq/L (ref 19–32)
Calcium: 9.6 mg/dL (ref 8.4–10.5)
Chloride: 105 mEq/L (ref 96–112)
Creatinine, Ser: 0.78 mg/dL (ref 0.40–1.20)
GFR: 89.21 mL/min (ref >60.00)
Glucose, Bld: 179 mg/dL — ABNORMAL HIGH (ref 70–99)
Potassium: 3.9 mEq/L (ref 3.5–5.1)
Sodium: 140 mEq/L (ref 135–145)
Total Bilirubin: 0.4 mg/dL (ref 0.2–1.2)
Total Protein: 6.6 g/dL (ref 6.0–8.3)

## 2020-03-03 LAB — VITAMIN D 25 HYDROXY (VIT D DEFICIENCY, FRACTURES): VITD: 53.26 ng/mL (ref 30.00–100.00)

## 2020-03-05 ENCOUNTER — Ambulatory Visit (INDEPENDENT_AMBULATORY_CARE_PROVIDER_SITE_OTHER): Payer: Medicare HMO | Admitting: Internal Medicine

## 2020-03-05 ENCOUNTER — Other Ambulatory Visit: Payer: Self-pay

## 2020-03-05 ENCOUNTER — Encounter: Payer: Self-pay | Admitting: Internal Medicine

## 2020-03-05 VITALS — BP 138/80 | HR 84 | Temp 98.2°F | Resp 15 | Ht 61.0 in | Wt 193.8 lb

## 2020-03-05 DIAGNOSIS — Z79899 Other long term (current) drug therapy: Secondary | ICD-10-CM | POA: Diagnosis not present

## 2020-03-05 DIAGNOSIS — R0989 Other specified symptoms and signs involving the circulatory and respiratory systems: Secondary | ICD-10-CM | POA: Diagnosis not present

## 2020-03-05 DIAGNOSIS — I10 Essential (primary) hypertension: Secondary | ICD-10-CM | POA: Diagnosis not present

## 2020-03-05 DIAGNOSIS — E119 Type 2 diabetes mellitus without complications: Secondary | ICD-10-CM

## 2020-03-05 DIAGNOSIS — Z0000 Encounter for general adult medical examination without abnormal findings: Secondary | ICD-10-CM | POA: Diagnosis not present

## 2020-03-05 DIAGNOSIS — N63 Unspecified lump in unspecified breast: Secondary | ICD-10-CM

## 2020-03-05 MED ORDER — AMLODIPINE BESYLATE 10 MG PO TABS: 10.0000 mg | ORAL_TABLET | Freq: Every day | ORAL | 5 refills | Status: DC

## 2020-03-05 MED ORDER — ROSUVASTATIN CALCIUM 5 MG PO TABS: 5.0000 mg | ORAL_TABLET | Freq: Every day | ORAL | 5 refills | Status: DC

## 2020-03-05 NOTE — Assessment & Plan Note (Signed)

## 2020-03-05 NOTE — Progress Notes (Signed)
Patient ID: Penny Butler, female    DOB: 1951/03/06  Age: 69 y.o. MRN: [id number]  The patient is here for annual preventive examination and management of other chronic and acute problems.  This visit occurred during the SARS-CoV-2 public health emergency.  Safety protocols were in place, including screening questions prior to the visit, additional usage of staff PPE, and extensive cleaning of exam room while observing appropriate contact time as indicated for disinfecting solutions.    Patient has received both doses of the available COVID 19 vaccine without complications.  Patient continues to mask when outside of the home except when walking in yard or at safe distances from others .  Patient denies any change in mood or development of unhealthy behaviors resuting from the pandemic's restriction of activities and socialization.      The risk factors are reflected in the social history.  The roster of all physicians providing medical care to patient - is listed in the Snapshot section of the chart.  Activities of daily living:  The patient is 100% independent in all ADLs: dressing, toileting, feeding as well as independent mobility  Home safety : The patient has smoke detectors in the home. They wear seatbelts.  There are no firearms at home. There is no violence in the home.   There is no risks for hepatitis, STDs or HIV. There is no   history of blood transfusion. They have no travel history to infectious disease endemic areas of the world.  The patient has seen their dentist in the last six month. They have seen their eye doctor in the last year. They admit to slight hearing difficulty with regard to whispered voices and some television programs.  They have deferred audiologic testing in the last year.  They do not  have excessive sun exposure. Discussed the need for sun protection: hats, long sleeves and use of sunscreen if there is significant sun exposure.   Diet: the importance of a  healthy diet is discussed. They do have a healthy diet.  The benefits of regular aerobic exercise were discussed. She walks 4 times per week ,  20 minutes.   Depression screen: there are no signs or vegative symptoms of depression- irritability, change in appetite, anhedonia, sadness/tearfullness.  Cognitive assessment: the patient manages all their financial and personal affairs and is actively engaged. They could relate day,date,year and events; recalled 2/3 objects at 3 minutes; performed clock-face test normally.  The following portions of the patient's history were reviewed and updated as appropriate: allergies, current medications, past family history, past medical history,  past surgical history, past social history  and problem list.  Visual acuity was not assessed per patient preference since she has regular follow up with her ophthalmologist. Hearing and body mass index were assessed and reviewed.   During the course of the visit the patient was educated and counseled about appropriate screening and preventive services including : fall prevention , diabetes screening, nutrition counseling, colorectal cancer screening, and recommended immunizations.    CC: The primary encounter diagnosis was Diabetes mellitus with no complication (HCC). Diagnoses of Left carotid bruit, Long-term use of high-risk medication, Morbid obesity (HCC), Breast lump or mass, Encounter for preventive health examination, and Essential hypertension were also pertinent to this visit.  History Hero has a past medical history of Hyperlipidemia, Hypertension, Obesity, OSA (obstructive sleep apnea), and Type 2 diabetes mellitus (HCC).   She has a past surgical history that includes Abdominal hysterectomy and Bilateral oophorectomy (2007).  Her family history includes Breast cancer (age of onset: 42) in her sister; Heart disease in her mother; Heart disease (age of onset: 33) in her father; Lung cancer in her father  and mother.She reports that she has never smoked. She has never used smokeless tobacco. She reports previous alcohol use. She reports that she does not use drugs.  Outpatient Medications Prior to Visit  Medication Sig Dispense Refill  . glucose blood (ONETOUCH VERIO) test strip USE AS DIRECTED EVERY DAY 100 strip 5  . OneTouch Delica Lancets 30G MISC USE AS DIRECTED EVERY DAY 100 each 5  . amLODipine (NORVASC) 10 MG tablet TAKE 1 TABLET BY MOUTH EVERY DAY 90 tablet 1  . Red Yeast Rice 600 MG CAPS Take 1,200 mg by mouth daily. Takes 600mg  twice a day     No facility-administered medications prior to visit.    Review of Systems  Patient denies headache, fevers, malaise, unintentional weight loss, skin rash, eye pain, sinus congestion and sinus pain, sore throat, dysphagia,  hemoptysis , cough, dyspnea, wheezing, chest pain, palpitations, orthopnea, edema, abdominal pain, nausea, melena, diarrhea, constipation, flank pain, dysuria, hematuria, urinary  Frequency, nocturia, numbness, tingling, seizures,  Focal weakness, Loss of consciousness,  Tremor, insomnia, depression, anxiety, and suicidal ideation.     Objective:  BP 138/80 (BP Location: Left Arm, Patient Position: Sitting, Cuff Size: Large)   Pulse 84   Temp 98.2 F (36.8 C) (Oral)   Resp 15   Ht 5\' 1"  (1.549 m)   Wt 193 lb 12.8 oz (87.9 kg)   SpO2 99%   BMI 36.62 kg/m   Physical Exam  General appearance: alert, cooperative and appears stated age Ears: normal TM's and external ear canals both ears Throat: lips, mucosa, and tongue normal; teeth and gums normal Neck: no adenopathy, no carotid bruit, supple, symmetrical, trachea midline and thyroid not enlarged, symmetric, no tenderness/mass/nodules Back: symmetric, no curvature. ROM normal. No CVA tenderness. Lungs: clear to auscultation bilaterally Heart: regular rate and rhythm, S1, S2 normal, no murmur, click, rub or gallop Abdomen: soft, non-tender; bowel sounds normal; no  masses,  no organomegaly Pulses: 2+ and symmetric Skin: Skin color, texture, turgor normal. No rashes or lesions Lymph nodes: Cervical, supraclavicular, and axillary nodes normal.  Assessment & Plan:   Problem List Items Addressed This Visit      Unprioritized   Hypertension    Well controlled on current regimen of amlodipine 10 mg daily she had no microalbuminuria last November.  Repeat is due       Relevant Medications   amLODipine (NORVASC) 10 MG tablet   rosuvastatin (CRESTOR) 5 MG tablet   Encounter for preventive health examination    age appropriate education and counseling updated, referrals for preventative services and immunizations addressed, dietary and smoking counseling addressed, most recent labs reviewed.  I have personally reviewed and have noted:  1) the patient's medical and social history 2) The pt's use of alcohol, tobacco, and illicit drugs 3) The patient's current medications and supplements 4) Functional ability including ADL's, fall risk, home safety risk, hearing and visual impairment 5) Diet and physical activities 6) Evidence for depression or mood disorder 7) The patient's height, weight, and BMI have been recorded in the chart  I have made referrals, and provided counseling and education based on review of the above      Diabetes mellitus with no complication (HCC) - Primary    Remains under good control on diet alone but the increase in  a1c is due to dietary indiscretions .  Diet reviewed.  Need urinary protein analysis.  Willing to try rosuvastatin       Relevant Medications   rosuvastatin (CRESTOR) 5 MG tablet   Other Relevant Orders   Microalbumin / creatinine urine ratio   Morbid obesity (HCC)    She was moderately successful last year in lowering her weight, but has regained 15 lbs due to indulging and not exercising . I have addressed  BMI and recommended a low glycemic index diet utilizing smaller more frequent meals to increase  metabolism.  I have also recommended that patient start exercising with a goal of 30 minutes of aerobic exercise a minimum of 5 days per week.       Breast lump or mass    She has a follow up diagnostic mammogram in one month       Other Visit Diagnoses    Left carotid bruit       Relevant Orders   VAS US CAROTID   Long-term use of high-risk medication       Relevant Orders   Comprehensive metabolic panel      I have discontinued Penny Butler's Red Yeast Rice. I have also changed her amLODipine. Additionally, I am having her start on rosuvastatin. Lastly, I am having her maintain her OneTouch Verio and OneTouch Delica Lancets 30G.  Meds ordered this encounter  Medications  . amLODipine (NORVASC) 10 MG tablet    Sig: Take 1 tablet (10 mg total) by mouth daily.    Dispense:  30 tablet    Refill:  5  . rosuvastatin (CRESTOR) 5 MG tablet    Sig: Take 1 tablet (5 mg total) by mouth daily.    Dispense:  30 tablet    Refill:  5    Medications Discontinued During This Encounter  Medication Reason  . Red Yeast Rice 600 MG CAPS   . amLODipine (NORVASC) 10 MG tablet Reorder    Follow-up: No follow-ups on file.   Sherlene Shams, MD

## 2020-03-05 NOTE — Assessment & Plan Note (Signed)
Remains under good control on diet alone but the increase in a1c is due to dietary indiscretions .  Diet reviewed.  Need urinary protein analysis.  Willing to try rosuvastatin

## 2020-03-05 NOTE — Patient Instructions (Signed)
I recommend a trial of rosuvastatin (generic Crestor) starting with one tablet weekly to lower your risk of heart attack and stroke    If you tolerate the medication without side effects,  Increase to twice weekly or more as you see fit   Atherosclerosis  Atherosclerosis is narrowing and hardening of the arteries. Arteries are blood vessels that carry blood from the heart to all parts of the body. This blood contains oxygen. Arteries can become narrow or clogged with a buildup of fat, cholesterol, calcium, and other substances (plaque). Plaque decreases the amount of blood that can flow through the artery. Atherosclerosis can affect any artery in the body, including:  Heart arteries (coronary artery disease). This may cause a heart attack.  Brain arteries. This may cause a stroke (cerebrovascular accident).  Leg, arm, and pelvis arteries (peripheral artery disease). This may cause pain and numbness.  Kidney arteries. This may cause kidney (renal) failure. Treatment may slow the disease and prevent further damage to the heart, brain, peripheral arteries, and kidneys. What are the causes? Atherosclerosis develops slowly over many years. The inner layers of your arteries become damaged and allow the gradual buildup of plaque. The exact cause of atherosclerosis is not fully understood. Symptoms of atherosclerosis do not occur until the artery becomes narrow or blocked. What increases the risk? The following factors may make you more likely to develop this condition:  High blood pressure.  High cholesterol.  Being middle-aged or older.  Having a family history of atherosclerosis.  Having high blood fats (triglycerides).  Diabetes.  Being overweight.  Smoking tobacco.  Not exercising enough (sedentary lifestyle).  Having a substance in the blood called C-reactive protein (CRP). This is a sign of increased levels of inflammation in the body.  Sleep apnea.  Being  stressed.  Drinking too much alcohol. What are the signs or symptoms? This condition may not cause any symptoms. If you have symptoms, they are caused by damage to an area of your body that is not getting enough blood.  Coronary artery disease may cause chest pain and shortness of breath.  Decreased blood supply to your brain may cause a stroke. Signs of a stroke may include sudden: ? Weakness on one side of the body. ? Confusion. ? Changes in vision. ? Inability to speak or understand speech. ? Loss of balance, coordination, or the ability to walk. ? Severe headache. ? Loss of consciousness.  Peripheral arterial disease may cause pain and numbness, often in the legs and hips.  Renal failure may cause fatigue, nausea, swelling, and itchy skin. How is this diagnosed? This condition is diagnosed based on your medical history and a physical exam. During the exam:  Your health care provider will: ? Check your pulse in different places. ? Listen for a "whooshing" sound over your arteries (bruit).  You may have tests, such as: ? Blood tests to check your levels of cholesterol, triglycerides, and CRP. ? Electrocardiogram (ECG) to check for heart damage. ? Chest X-ray to see if you have an enlarged heart, which is a sign of heart failure. ? Stress test to see how your heart reacts to exercise. ? Echocardiogram to get images of the inside of your heart. ? Ankle-brachial index to compare blood pressure in your arms to blood pressure in your ankles. ? Ultrasound of your peripheral arteries to check blood flow. ? CT scan to check for damage to your heart or brain. ? X-rays of blood vessels after dye has been injected (  angiogram) to check blood flow. How is this treated? Treatment starts with lifestyle changes, which may include:  Changing your diet.  Losing weight.  Reducing stress.  Exercising and being physically active more regularly.  Not smoking. You may also need medicine  to:  Lower triglycerides and cholesterol.  Control blood pressure.  Prevent blood clots.  Lower inflammation in your body.  Control your blood sugar. Sometimes, surgery is needed to:  Remove plaque from an artery (endarterectomy).  Open or widen a narrowed heart artery (angioplasty).  Create a new path for your blood with one of these procedures: ? Heart (coronary) artery bypass graft surgery. ? Peripheral artery bypass graft surgery. Follow these instructions at home: Eating and drinking   Eat a heart-healthy diet. Talk with your health care provider or a diet and nutrition specialist (dietitian) if you need help. A heart-healthy diet involves: ? Limiting unhealthy fats and increasing healthy fats. Some examples of healthy fats are olive oil and canola oil. ? Eating plant-based foods, such as fruits, vegetables, nuts, whole grains, and legumes (such as peas and lentils).  Limit alcohol intake to no more than 1 drink a day for nonpregnant women and 2 drinks a day for men. One drink equals 12 oz of beer, 5 oz of wine, or 1 oz of hard liquor. Lifestyle  Follow an exercise program as told by your health care provider.  Maintain a healthy weight. Lose weight if your health care provider says that you need to do that.  Rest when you are tired.  Learn to manage your stress.  Do not use any products that contain nicotine or tobacco, such as cigarettes and e-cigarettes. If you need help quitting, ask your health care provider.  Do not abuse drugs. General instructions  Take over-the-counter and prescription medicines only as told by your health care provider.  Manage other health conditions as told by your health care provider.  Keep all follow-up visits as told by your health care provider. This is important. Contact a health care provider if:  You have chest pain or discomfort. This includes squeezing chest pain that may feel like indigestion (angina).  You have  shortness of breath.  You have an irregular heartbeat.  You have unexplained fatigue.  You have unexplained pain or numbness in an arm, leg, or hip.  You have nausea, swelling of your hands or feet, and itchy skin. Get help right away if:  You have any symptoms of a heart attack, such as: ? Chest pain. ? Shortness of breath. ? Pain in your neck, jaw, arms, back, or stomach. ? Cold sweat. ? Nausea. ? Light-headedness.  You have any symptoms of a stroke. "BE FAST" is an easy way to remember the main warning signs of a stroke: ? B - Balance. Signs are dizziness, sudden trouble walking, or loss of balance. ? E - Eyes. Signs are trouble seeing or a sudden change in vision. ? F - Face. Signs are sudden weakness or numbness of the face, or the face or eyelid drooping on one side. ? A - Arms. Signs are weakness or numbness in an arm. This happens suddenly and usually on one side of the body. ? S - Speech. Signs are sudden trouble speaking, slurred speech, or trouble understanding what people say. ? T - Time. Time to call emergency services. Write down what time symptoms started.  You have other signs of a stroke, such as: ? A sudden, severe headache with no known cause. ?  Nausea or vomiting. ? Seizure. These symptoms may represent a serious problem that is an emergency. Do not wait to see if the symptoms will go away. Get medical help right away. Call your local emergency services (911 in the [place] Do not drive yourself to the hospital. Summary  Atherosclerosis is narrowing and hardening of the arteries.  Arteries can become narrow or clogged with a buildup of fat, cholesterol, calcium, and other substances (plaque).  This condition may not cause any symptoms. If you do have symptoms, they are caused by damage to an area of your body that is not getting enough blood.  Treatment may include lifestyle changes and medicines. In some cases, surgery is needed. This information is not  intended to replace advice given to you by your health care provider. Make sure you discuss any questions you have with your health care provider. Document Revised: 09/16/2017 Document Reviewed: 02/10/2017 Elsevier Patient Education  2020 [employer].

## 2020-03-05 NOTE — Assessment & Plan Note (Signed)
She has a follow up diagnostic mammogram in one month

## 2020-03-05 NOTE — Assessment & Plan Note (Signed)
Well controlled on current regimen of amlodipine 10 mg daily she had no microalbuminuria last November.  Repeat is due

## 2020-03-05 NOTE — Assessment & Plan Note (Signed)
She was moderately successful last year in lowering her weight, but has regained 15 lbs due to indulging and not exercising . I have addressed  BMI and recommended a low glycemic index diet utilizing smaller more frequent meals to increase metabolism.  I have also recommended that patient start exercising with a goal of 30 minutes of aerobic exercise a minimum of 5 days per week.

## 2020-03-17 DIAGNOSIS — G4733 Obstructive sleep apnea (adult) (pediatric): Secondary | ICD-10-CM | POA: Diagnosis not present

## 2020-03-17 DIAGNOSIS — I10 Essential (primary) hypertension: Secondary | ICD-10-CM | POA: Diagnosis not present

## 2020-04-09 ENCOUNTER — Telehealth: Payer: Self-pay

## 2020-04-09 NOTE — Telephone Encounter (Signed)
Good afternoon!  I see the order I have not started working on the list for Dr Darrick Huntsman pt's. I will fax this to [facility] per note on order.

## 2020-04-09 NOTE — Telephone Encounter (Signed)
A carotid ultrasound was ordered on this patient on Sept 15. Please give me a status

## 2020-04-09 NOTE — Telephone Encounter (Signed)
Pt is checking on referral for artery in her neck. She states that Dr Darrick Huntsman wanted this done this month. She has the day off Monday to go for a mammogram and wants to see if they can get her in same day for artery. Please advise at your earliest convenience. Pt does not want to wait much longer.

## 2020-04-09 NOTE — Telephone Encounter (Signed)
I don't see a referral or any imaging ordered in chart.

## 2020-04-14 ENCOUNTER — Other Ambulatory Visit: Payer: Medicare HMO

## 2020-04-16 DIAGNOSIS — G4733 Obstructive sleep apnea (adult) (pediatric): Secondary | ICD-10-CM | POA: Diagnosis not present

## 2020-04-16 DIAGNOSIS — I10 Essential (primary) hypertension: Secondary | ICD-10-CM | POA: Diagnosis not present

## 2020-04-19 IMAGING — MG MM DIGITAL SCREENING BILAT W/ TOMO W/ CAD
8 series · 8 of 24 positions shown · non-contrast
Comparison: Previous exam(s).

CLINICAL DATA: Screening.

EXAM:
DIGITAL SCREENING BILATERAL MAMMOGRAM WITH TOMO AND CAD

[R MLO synth-2D]
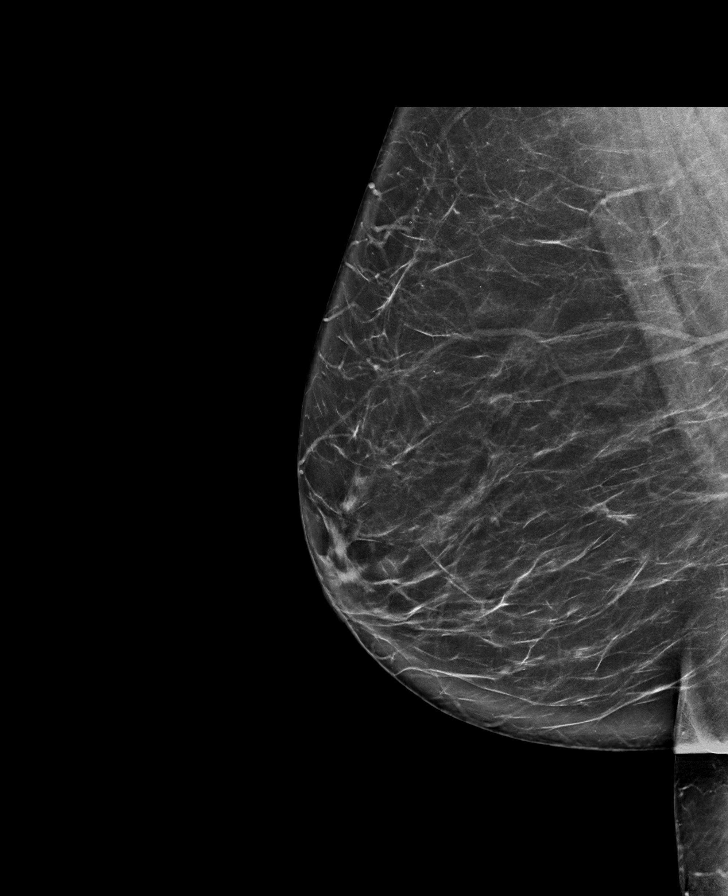

[L MLO synth-2D]
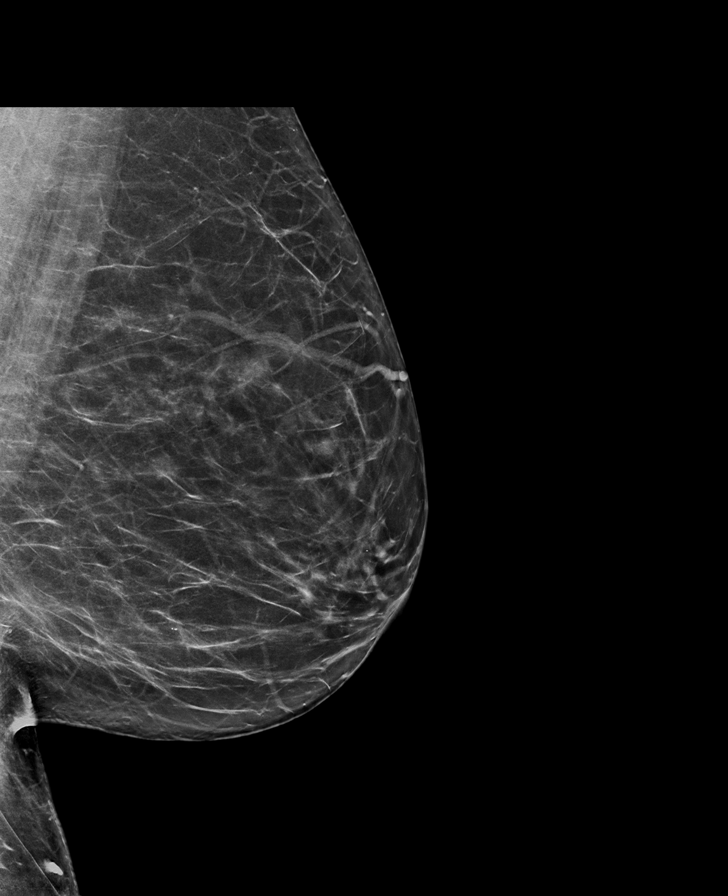

[L CC synth-2D]
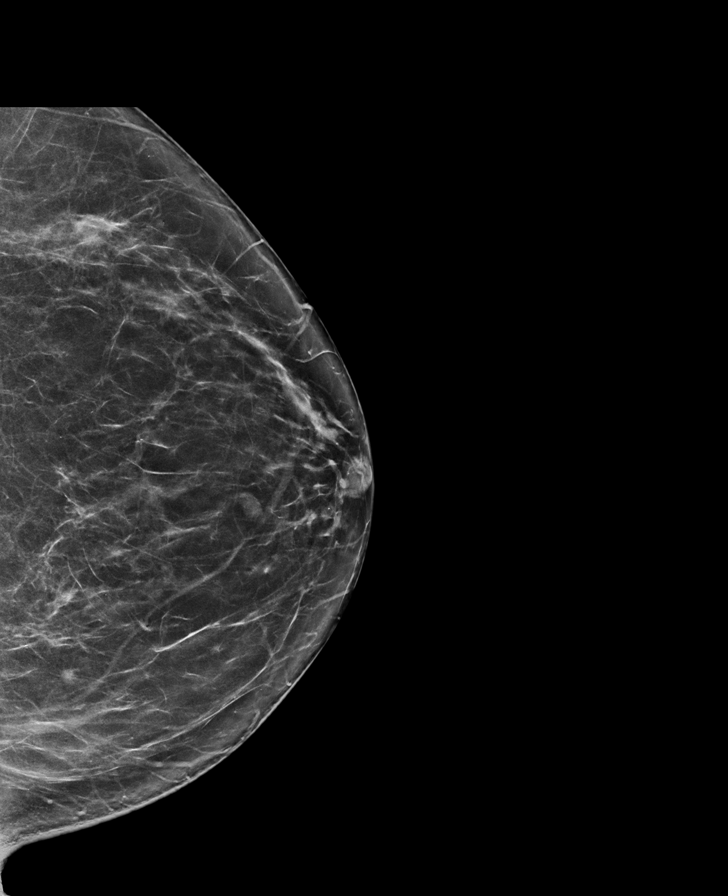

[R CC synth-2D]
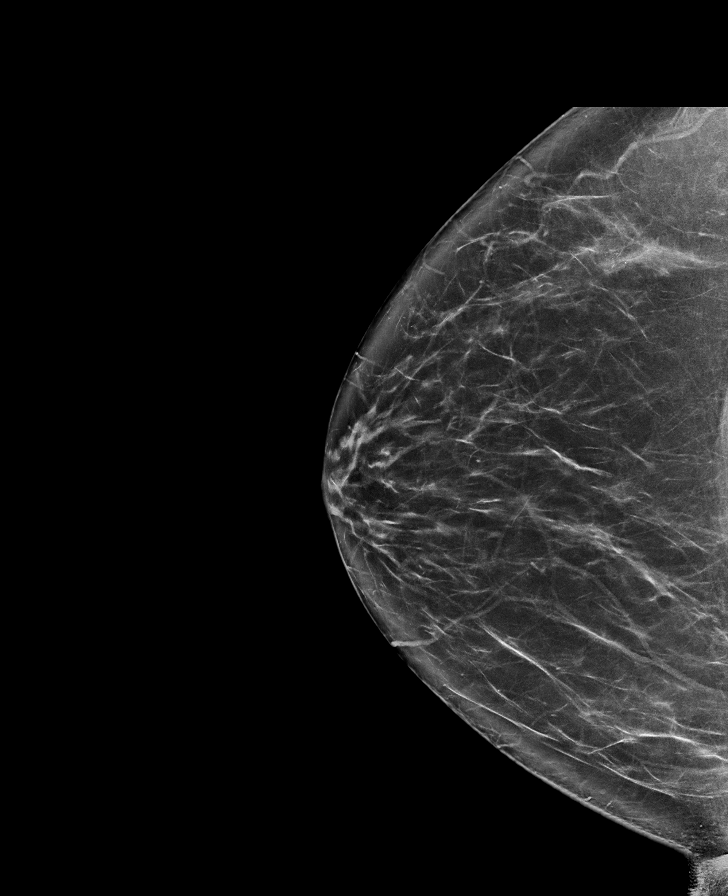

[R CC tomo · tomo slice 43/84.0]
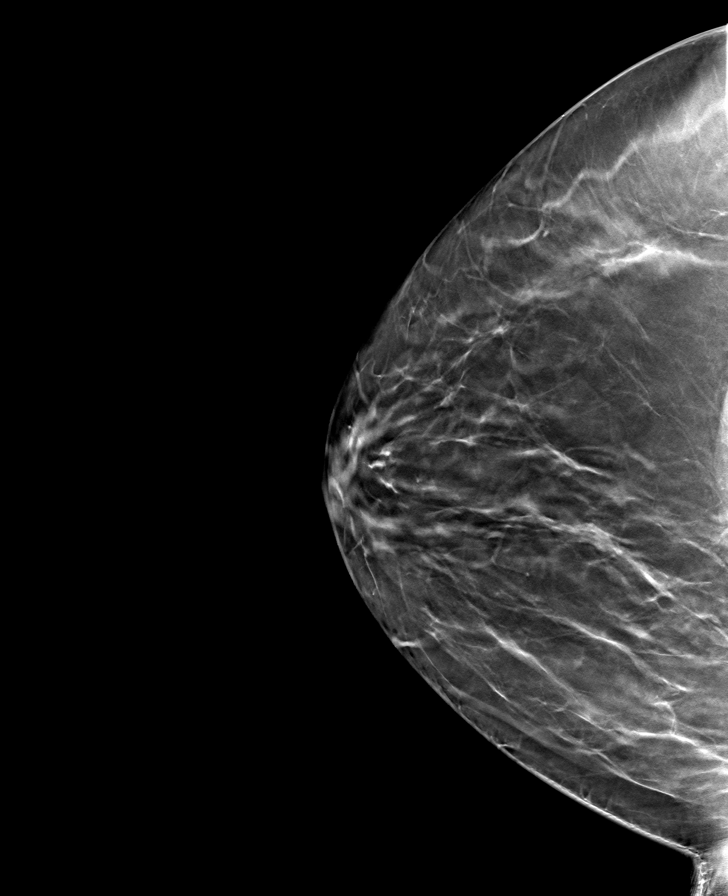

[R MLO tomo · tomo slice 48/95.0]
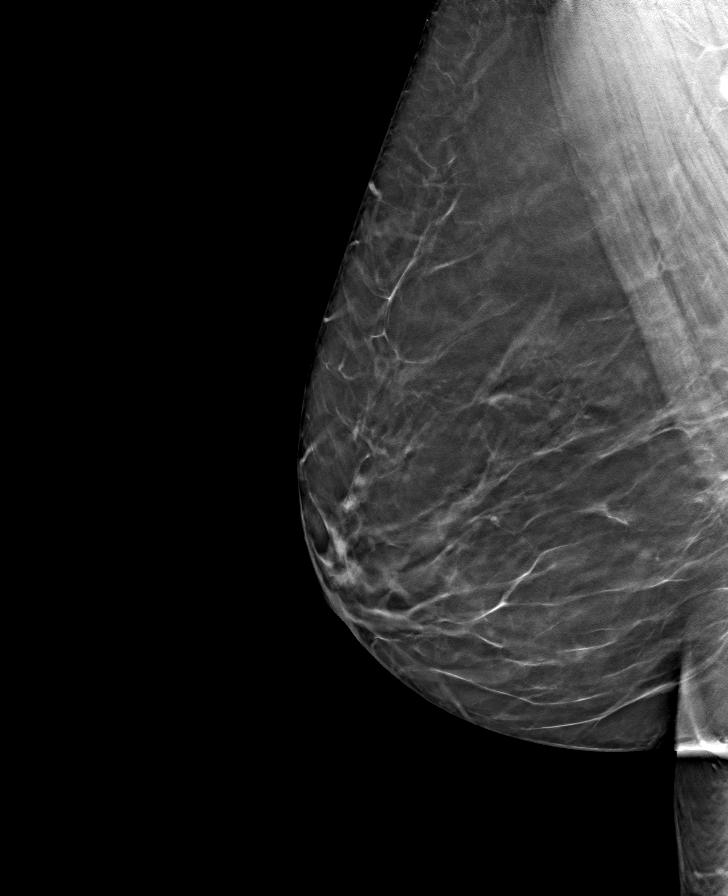

[L CC tomo · tomo slice 38/75.0]
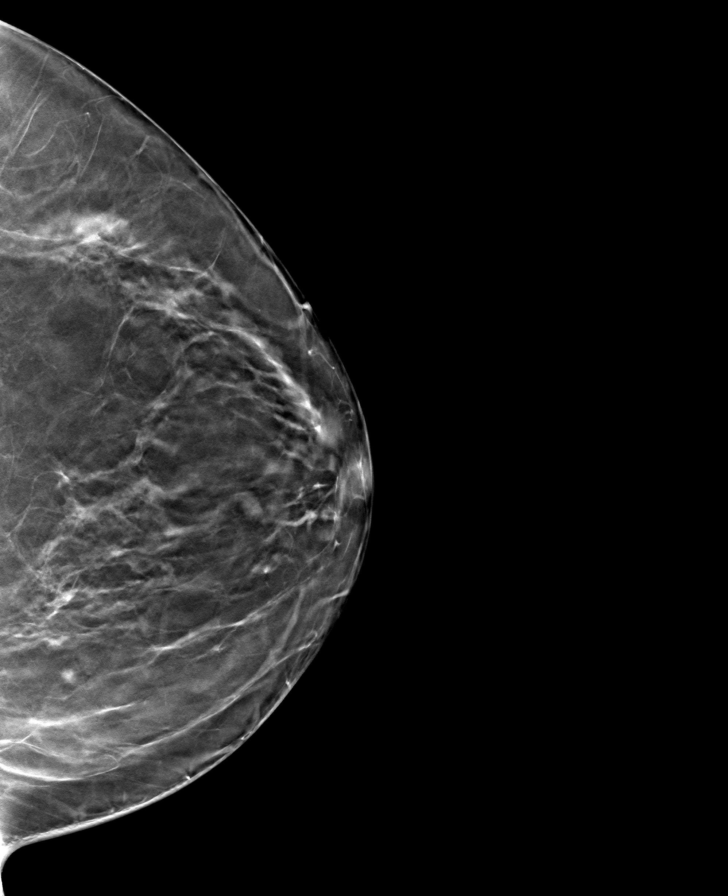

[L MLO tomo · tomo slice 39/77.0]
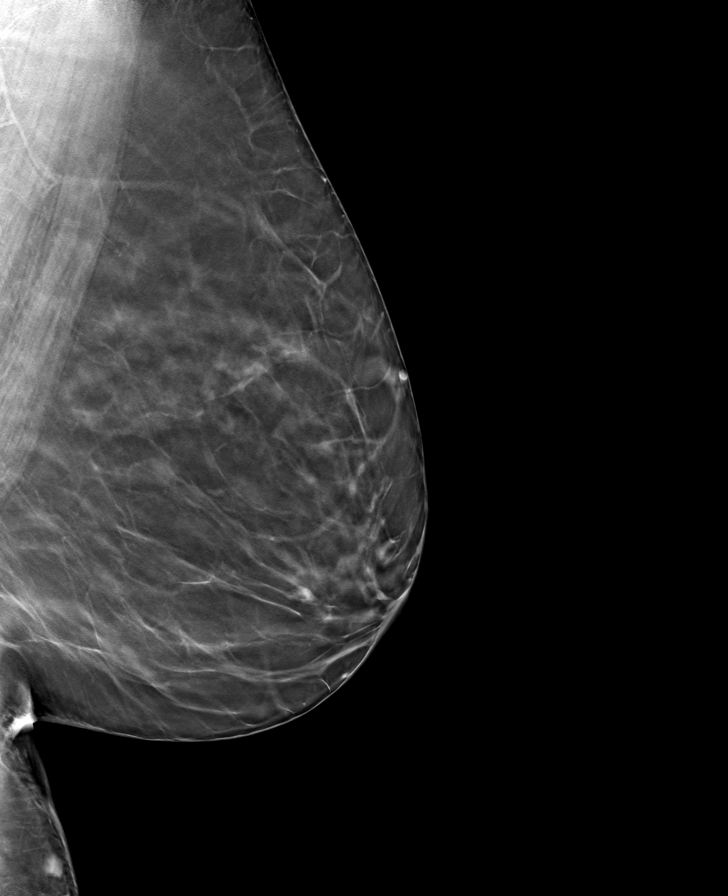

[8 of 24 positions shown; findings below may reference images not displayed]

ACR Breast Density Category b: There are scattered areas of
fibroglandular density.
FINDINGS: There are no findings suspicious for malignancy. Images were
processed with CAD.
IMPRESSION: No mammographic evidence of malignancy. A result letter of this
screening mammogram will be mailed directly to the patient.

RECOMMENDATION:
Screening mammogram in one year. (Code:[id number])

BI-RADS CATEGORY  1: Negative.

## 2020-04-25 ENCOUNTER — Other Ambulatory Visit: Payer: Medicare HMO

## 2020-04-30 ENCOUNTER — Encounter (INDEPENDENT_AMBULATORY_CARE_PROVIDER_SITE_OTHER): Payer: Self-pay

## 2020-04-30 ENCOUNTER — Other Ambulatory Visit: Payer: Self-pay

## 2020-04-30 ENCOUNTER — Ambulatory Visit (INDEPENDENT_AMBULATORY_CARE_PROVIDER_SITE_OTHER): Payer: Medicare HMO

## 2020-04-30 ENCOUNTER — Ambulatory Visit
Admission: RE | Admit: 2020-04-30 | Discharge: 2020-04-30 | Disposition: A | Discharge status: Home / Self Care | Payer: Medicare HMO | Source: Ambulatory Visit | Attending: Internal Medicine | Admitting: Internal Medicine

## 2020-04-30 DIAGNOSIS — N63 Unspecified lump in unspecified breast: Secondary | ICD-10-CM | POA: Insufficient documentation

## 2020-04-30 DIAGNOSIS — R0989 Other specified symptoms and signs involving the circulatory and respiratory systems: Secondary | ICD-10-CM | POA: Diagnosis not present

## 2020-04-30 DIAGNOSIS — N6489 Other specified disorders of breast: Secondary | ICD-10-CM | POA: Diagnosis not present

## 2020-04-30 NOTE — Assessment & Plan Note (Signed)
REPEAT ultrasound of axilla notes normalization of LN's.  Advised to radiology to resume screening mammogram July 2022

## 2020-05-07 ENCOUNTER — Encounter: Payer: Self-pay | Admitting: Internal Medicine

## 2020-05-07 DIAGNOSIS — I739 Peripheral vascular disease, unspecified: Secondary | ICD-10-CM | POA: Insufficient documentation

## 2020-05-17 DIAGNOSIS — I10 Essential (primary) hypertension: Secondary | ICD-10-CM | POA: Diagnosis not present

## 2020-05-17 DIAGNOSIS — G4733 Obstructive sleep apnea (adult) (pediatric): Secondary | ICD-10-CM | POA: Diagnosis not present

## 2020-05-20 DIAGNOSIS — G4733 Obstructive sleep apnea (adult) (pediatric): Secondary | ICD-10-CM | POA: Diagnosis not present

## 2020-05-20 DIAGNOSIS — I10 Essential (primary) hypertension: Secondary | ICD-10-CM | POA: Diagnosis not present

## 2020-06-12 ENCOUNTER — Other Ambulatory Visit: Payer: Self-pay | Admitting: Internal Medicine

## 2020-06-12 ENCOUNTER — Telehealth: Payer: Self-pay

## 2020-06-12 DIAGNOSIS — I152 Hypertension secondary to endocrine disorders: Secondary | ICD-10-CM

## 2020-06-12 DIAGNOSIS — E1159 Type 2 diabetes mellitus with other circulatory complications: Secondary | ICD-10-CM

## 2020-06-12 MED ORDER — GLIPIZIDE ER 5 MG PO TB24: 5.0000 mg | ORAL_TABLET | Freq: Every day | ORAL | 0 refills | Status: DC

## 2020-06-12 NOTE — Telephone Encounter (Signed)
Pt states that she does not want to take metformin for high blood sugar but sugar this morning is 252. She wants a call back today. She does not want an appt.

## 2020-06-12 NOTE — Telephone Encounter (Signed)
Sch appt Dr. Darrick Huntsman no exceptions for high blood sugar  Advise avoid sweets and pasta, breads, rice, exercise  Can stop metformin  Rec start glipizide 5 mg xl daily  -which pharmacy?   and monitor BP temp supply until can f/u with PCP

## 2020-06-12 NOTE — Telephone Encounter (Signed)
Is there anything else pt can take to control blood sugars other than metformin because it makes her sick to her stomach. Pt stated that she was taking the metformin and the glimepiride only if her sugar got to high. She stated that the other day she ate too much cheesecake so her bs went up. She took both medications and she was sick for the day with upset stomach. Pt is wanting to know if there is any other medication that she take instead to help control her bs. She stated that she needs something that has the least side effects.

## 2020-06-16 DIAGNOSIS — G4733 Obstructive sleep apnea (adult) (pediatric): Secondary | ICD-10-CM | POA: Diagnosis not present

## 2020-06-16 DIAGNOSIS — I10 Essential (primary) hypertension: Secondary | ICD-10-CM | POA: Diagnosis not present

## 2020-06-22 ENCOUNTER — Other Ambulatory Visit: Payer: Self-pay | Admitting: Internal Medicine

## 2020-06-23 ENCOUNTER — Other Ambulatory Visit: Payer: Self-pay | Admitting: Internal Medicine

## 2020-06-26 ENCOUNTER — Telehealth: Payer: Self-pay

## 2020-06-26 NOTE — Telephone Encounter (Signed)
In the future. Please direct patient questions to the doctor that she referenced in her message , to avoid delays

## 2020-06-26 NOTE — Telephone Encounter (Signed)
Pt needs clarification on glipiZIDE (GLUCOTROL XL) 5 MG 24 hr tablet . She wants to know why Dr [employer] put her on a whole pill because it makes her weak. Please advise today

## 2020-06-27 ENCOUNTER — Other Ambulatory Visit: Payer: Self-pay | Admitting: Internal Medicine

## 2020-06-27 DIAGNOSIS — I152 Hypertension secondary to endocrine disorders: Secondary | ICD-10-CM

## 2020-06-27 DIAGNOSIS — E1159 Type 2 diabetes mellitus with other circulatory complications: Secondary | ICD-10-CM

## 2020-06-27 MED ORDER — GLIPIZIDE ER 2.5 MG PO TB24: 2.5000 mg | ORAL_TABLET | Freq: Every day | ORAL | 0 refills | Status: DC

## 2020-06-27 NOTE — Telephone Encounter (Signed)
I'm sorry that I did not make it clear in my first message. The reason why I sent that message to Penny Butler. and not to Dr. [employer]' assistant is because pt would like to hear from her PCP (Dr. Darrick Huntsman) why Dr. Lonie Peak changed meds. Please advise

## 2020-06-27 NOTE — Telephone Encounter (Signed)
NO worries, thank you for your diligence.  You can tell ms Gambrell that I do not attempt to explain what another doctor did or prescribe, certainly not until I have seen the patient in an office visit..   I think very highly of Dr Alford Highland skills and judgement, so If Dr. Alford Highland 2nd reply does not suffice,  Please offer to schedule her an office visit with me.

## 2020-06-27 NOTE — Telephone Encounter (Signed)
At one point she was on 5 mg immed. Release 2x per day which equals ER 5 mg daily this is why  If having issues an appt in person is best and checking her blood sugars  5 mg XR/XL is daily She needs to monitor Blood sugar write then down and f/u with PCP in person this is best to manage her diabetes every 3-6 months  Reduced glipizide XR to 2.5 mg daily if she cant tolerate 5 mg xl/xr

## 2020-06-28 ENCOUNTER — Other Ambulatory Visit: Payer: Self-pay | Admitting: *Deleted

## 2020-07-10 ENCOUNTER — Telehealth: Payer: Self-pay | Admitting: Internal Medicine

## 2020-07-10 ENCOUNTER — Encounter: Payer: Self-pay | Admitting: Internal Medicine

## 2020-07-10 ENCOUNTER — Telehealth (INDEPENDENT_AMBULATORY_CARE_PROVIDER_SITE_OTHER): Payer: Medicare HMO | Admitting: Internal Medicine

## 2020-07-10 VITALS — Ht 61.0 in | Wt 193.0 lb

## 2020-07-10 DIAGNOSIS — E7800 Pure hypercholesterolemia, unspecified: Secondary | ICD-10-CM

## 2020-07-10 DIAGNOSIS — E669 Obesity, unspecified: Secondary | ICD-10-CM

## 2020-07-10 DIAGNOSIS — E1159 Type 2 diabetes mellitus with other circulatory complications: Secondary | ICD-10-CM

## 2020-07-10 DIAGNOSIS — I152 Hypertension secondary to endocrine disorders: Secondary | ICD-10-CM | POA: Diagnosis not present

## 2020-07-10 DIAGNOSIS — E1169 Type 2 diabetes mellitus with other specified complication: Secondary | ICD-10-CM | POA: Diagnosis not present

## 2020-07-10 DIAGNOSIS — I10 Essential (primary) hypertension: Secondary | ICD-10-CM | POA: Diagnosis not present

## 2020-07-10 MED ORDER — OZEMPIC (0.25 OR 0.5 MG/DOSE) 2 MG/1.5ML ~~LOC~~ SOPN: 0.2500 mg | PEN_INJECTOR | SUBCUTANEOUS | 3 refills | Status: DC

## 2020-07-10 NOTE — Progress Notes (Signed)
Virtual Visit converted to telephone   This visit type was conducted due to national recommendations for restrictions regarding the COVID-19 pandemic (e.g. social distancing).  This format is felt to be most appropriate for this patient at this time.  All issues noted in this document were discussed and addressed.  No physical exam was performed (except for noted visual exam findings with Video Visits).   I connected with@ on 07/10/20 at 11:30 AM EST by  telephone and verified that I am speaking with the correct person using two identifiers. Location patient: home Location provider: work or home office Persons participating in the virtual visit: patient, provider  I discussed the limitations, risks, security and privacy concerns of performing an evaluation and management service by telephone and the availability of in person appointments. I also discussed with the patient that there may be a patient responsible charge related to this service. The patient expressed understanding and agreed to proceed.  Interactive audio and video telecommunications were attempted between this provider and patient, however failed, due to patient having technical difficulties .  We continued and completed visit with audio only.   Reason for visit:diabetes follow up  HPI:  70 yr female with type 2 DM,  Obesity,  hypertension and hyperlipidemia presents for 4 month follow up.  Last seen sept 2021 BY ME .  However she lost glycemic control  Over the holidays and was  started on metformin and glipizide by Dr Shirlee Latch  in December for cbgs of 300 .  She has not been checking her CBGS very often and admits that she is tired of "giving up" her favorite foods "all the time."  She has been reading about some of the newer medications and is requesting a trial of Trulicity or Ozempic.  She did  not tolerate metformin due to persistent nausea and  light headedness.  Currently she has 2 sugar readings to report :  A fasting of 200  and 190 post prandially on glipizide 2.5 mg XR  alone.   She is trying to walk daily for exercise and averages about 3 times per week ,  20 minutes.   Hypertension: patient does not checks blood pressure often .   Patient is following a reduce salt diet most days and is taking amlodipine 10 mg daily   Hyperlipidemia:  She is tolerating Crestor   ROS: See pertinent positives and negatives per HPI.  Past Medical History:  Diagnosis Date  . Hyperlipidemia   . Hypertension   . Obesity   . OSA (obstructive sleep apnea)    now off of CAPAP without symptoms   . Type 2 diabetes mellitus (HCC)     Past Surgical History:  Procedure Laterality Date  . ABDOMINAL HYSTERECTOMY    . BILATERAL OOPHORECTOMY  2007   [place]    Family History  Problem Relation Age of Onset  . Breast cancer Sister 20  . Heart disease Mother   . Lung cancer Mother   . Heart disease Father 46  . Lung cancer Father     SOCIAL HX:  reports that she has never smoked. She has never used smokeless tobacco. She reports previous alcohol use. She reports that she does not use drugs.   Current Outpatient Medications:  .  amLODipine (NORVASC) 10 MG tablet, Take 1 tablet (10 mg total) by mouth daily., Disp: 30 tablet, Rfl: 5 .  glipiZIDE (GLUCOTROL XL) 2.5 MG 24 hr tablet, Take 1 tablet (2.5 mg total) by mouth daily  with breakfast., Disp: 15 tablet, Rfl: 0 .  glucose blood (ONETOUCH VERIO) test strip, USE AS DIRECTED Once per   DAY, Disp: 100 strip, Rfl: 5 .  OneTouch Delica Lancets 30G MISC, USE AS DIRECTED EVERY DAY, Disp: 100 each, Rfl: 5 .  rosuvastatin (CRESTOR) 5 MG tablet, Take 1 tablet (5 mg total) by mouth daily., Disp: 30 tablet, Rfl: 5 .  Semaglutide,0.25 or 0.5MG /DOS, (OZEMPIC, 0.25 OR 0.5 MG/DOSE,) 2 MG/1.5ML SOPN, Inject 0.25 mg into the skin once a week., Disp: 1.5 mL, Rfl: 3  EXAM:  VITALS per patient if applicable:  GENERAL: alert, oriented, appears well and in no acute distress  HEENT:  atraumatic, conjunttiva clear, no obvious abnormalities on inspection of external nose and ears  NECK: normal movements of the head and neck  LUNGS: on inspection no signs of respiratory distress, breathing rate appears normal, no obvious gross SOB, gasping or wheezing  CV: no obvious cyanosis  MS: moves all visible extremities without noticeable abnormality  PSYCH/NEURO: pleasant and cooperative, no obvious depression or anxiety, speech and thought processing grossly intact  ASSESSMENT AND PLAN:  Discussed the following assessment and plan:  Obesity, diabetes, and hypertension syndrome (HCC) - Plan: Hemoglobin A1c, Comprehensive metabolic panel, Lipid panel, Microalbumin / creatinine urine ratio, AMB Referral to Community Care Coordinaton  Pure hypercholesterolemia  Primary hypertension  Obesity, diabetes, and hypertension syndrome (HCC) Diagnosed in June 2020 with a1c of 16.0.   She prefers to control diabetes with diet but has trouble maintaininga low GI diet, does not tolerate metformin due to persistent nausea and is not normoglycemic on 2.5 mg glipizide.  She has no C/I to Ozempic and is morbidly obese;  Will start after labs have been brought up to date.  Also recommending the Optavia diet    Hyperlipidemia She started Crestor in SEptember and did not return for follow up labs as advised.   Lab Results  Component Value Date   CHOL 192 03/03/2020   HDL 38.30 (L) 03/03/2020   LDLCALC 124 (H) 03/03/2020   LDLDIRECT 66.0 11/22/2018   TRIG 145.0 03/03/2020   CHOLHDL 5 03/03/2020   Lab Results  Component Value Date   ALT 20 03/03/2020   AST 15 03/03/2020   ALKPHOS 90 03/03/2020   BILITOT 0.4 03/03/2020     Hypertension In office visit scheduled for follow up one month.  She reports adherence to amlodipine 10 mg daily regimen.      I discussed the assessment and treatment plan with the patient. The patient was provided an opportunity to ask questions and all were  answered. The patient agreed with the plan and demonstrated an understanding of the instructions.   The patient was advised to call back or seek an in-person evaluation if the symptoms worsen or if the condition fails to improve as anticipated.  I provided  30 minutes of non-face-to-face time during this encounter.   Sherlene Shams, MD

## 2020-07-10 NOTE — Telephone Encounter (Signed)
Catie,  I am making a CCM management referral for this patient.   She wants to start ozempic,  Her out of pocket cost is $47 (not bad, I say) ,but wants to do know if you can get it down any more.Marland KitchenMarland Kitchen

## 2020-07-10 NOTE — Assessment & Plan Note (Addendum)
She started Crestor in SEptember and did not return for follow up labs as advised.   Lab Results  Component Value Date   CHOL 192 03/03/2020   HDL 38.30 (L) 03/03/2020   LDLCALC 124 (H) 03/03/2020   LDLDIRECT 66.0 11/22/2018   TRIG 145.0 03/03/2020   CHOLHDL 5 03/03/2020   Lab Results  Component Value Date   ALT 20 03/03/2020   AST 15 03/03/2020   ALKPHOS 90 03/03/2020   BILITOT 0.4 03/03/2020

## 2020-07-10 NOTE — Telephone Encounter (Signed)
Patient wanted Dr. Darrick Huntsman know that her copay with Medicare will be $47 a month on her Semaglutide,0.25 or 0.5MG /DOS, (OZEMPIC, 0.25 OR 0.5 MG/DOSE,) 2 MG/1.5ML SOPN. If Dr. Darrick Huntsman could get that lower for her she would appreciate her doing so. Patient stated she will call office back to make 88m follow up when she starts the medication. Fasting lab scheduled for 07/14/20, patient can not do Wednesday.

## 2020-07-10 NOTE — Telephone Encounter (Signed)
Yep, $47/month is a typical Medicare cost for Tier 3 drugs. I would look into the 90 day price - often, there is a price break that 3 months costs the same as 6 months, so basically 1 month free.   We could talk about patient assistance and if she qualifies. I would recommend she go ahead and pick up the medication (if she can) to get started and see if she tolerates prior to working on patient assistance.   I do not have any samples at the moment, but am working on it.

## 2020-07-10 NOTE — Patient Instructions (Signed)
Semaglutide injection solution What is this medicine? SEMAGLUTIDE (Sem a GLOO tide) is used to improve blood sugar control in adults with type 2 diabetes. This medicine may be used with other diabetes medicines. This drug may also reduce the risk of heart attack or stroke if you have type 2 diabetes and risk factors for heart disease. This medicine may be used for other purposes; ask your health care provider or pharmacist if you have questions. COMMON BRAND NAME(S): OZEMPIC What should I tell my health care provider before I take this medicine? They need to know if you have any of these conditions: endocrine tumors (MEN 2) or if someone in your family had these tumors eye disease, vision problems history of pancreatitis kidney disease stomach problems thyroid cancer or if someone in your family had thyroid cancer an unusual or allergic reaction to semaglutide, other medicines, foods, dyes, or preservatives pregnant or trying to get pregnant breast-feeding How should I use this medicine? This medicine is for injection under the skin of your upper leg (thigh), stomach area, or upper arm. It is given once every week (every 7 days). You will be taught how to prepare and give this medicine. Use exactly as directed. Take your medicine at regular intervals. Do not take it more often than directed. If you use this medicine with insulin, you should inject this medicine and the insulin separately. Do not mix them together. Do not give the injections right next to each other. Change (rotate) injection sites with each injection. It is important that you put your used needles and syringes in a special sharps container. Do not put them in a trash can. If you do not have a sharps container, call your pharmacist or healthcare provider to get one. A special MedGuide will be given to you by the pharmacist with each prescription and refill. Be sure to read this information carefully each time. This drug comes  with INSTRUCTIONS FOR USE. Ask your pharmacist for directions on how to use this drug. Read the information carefully. Talk to your pharmacist or health care provider if you have questions. Talk to your pediatrician regarding the use of this medicine in children. Special care may be needed. Overdosage: If you think you have taken too much of this medicine contact a poison control center or emergency room at once. NOTE: This medicine is only for you. Do not share this medicine with others. What if I miss a dose? If you miss a dose, take it as soon as you can within 5 days after the missed dose. Then take your next dose at your regular weekly time. If it has been longer than 5 days after the missed dose, do not take the missed dose. Take the next dose at your regular time. Do not take double or extra doses. If you have questions about a missed dose, contact your health care provider for advice. What may interact with this medicine? other medicines for diabetes Many medications may cause changes in blood sugar, these include: alcohol containing beverages antiviral medicines for HIV or AIDS aspirin and aspirin-like drugs certain medicines for blood pressure, heart disease, irregular heart beat chromium diuretics female hormones, such as estrogens or progestins, birth control pills fenofibrate gemfibrozil isoniazid lanreotide female hormones or anabolic steroids MAOIs like Carbex, Eldepryl, Marplan, Nardil, and Parnate medicines for weight loss medicines for allergies, asthma, cold, or cough medicines for depression, anxiety, or psychotic disturbances niacin nicotine NSAIDs, medicines for pain and inflammation, like ibuprofen or naproxen octreotide   pasireotide pentamidine phenytoin probenecid quinolone antibiotics such as ciprofloxacin, levofloxacin, ofloxacin some herbal dietary supplements steroid medicines such as prednisone or cortisone sulfamethoxazole; trimethoprim thyroid  hormones Some medications can hide the warning symptoms of low blood sugar (hypoglycemia). You may need to monitor your blood sugar more closely if you are taking one of these medications. These include: beta-blockers, often used for high blood pressure or heart problems (examples include atenolol, metoprolol, propranolol) clonidine guanethidine reserpine This list may not describe all possible interactions. Give your health care provider a list of all the medicines, herbs, non-prescription drugs, or dietary supplements you use. Also tell them if you smoke, drink alcohol, or use illegal drugs. Some items may interact with your medicine. What should I watch for while using this medicine? Visit your doctor or health care professional for regular checks on your progress. Drink plenty of fluids while taking this medicine. Check with your doctor or health care professional if you get an attack of severe diarrhea, nausea, and vomiting. The loss of too much body fluid can make it dangerous for you to take this medicine. A test called the HbA1C (A1C) will be monitored. This is a simple blood test. It measures your blood sugar control over the last 2 to 3 months. You will receive this test every 3 to 6 months. Learn how to check your blood sugar. Learn the symptoms of low and high blood sugar and how to manage them. Always carry a quick-source of sugar with you in case you have symptoms of low blood sugar. Examples include hard sugar candy or glucose tablets. Make sure others know that you can choke if you eat or drink when you develop serious symptoms of low blood sugar, such as seizures or unconsciousness. They must get medical help at once. Tell your doctor or health care professional if you have high blood sugar. You might need to change the dose of your medicine. If you are sick or exercising more than usual, you might need to change the dose of your medicine. Do not skip meals. Ask your doctor or health  care professional if you should avoid alcohol. Many nonprescription cough and cold products contain sugar or alcohol. These can affect blood sugar. Pens should never be shared. Even if the needle is changed, sharing may result in passing of viruses like hepatitis or HIV. Wear a medical ID bracelet or chain, and carry a card that describes your disease and details of your medicine and dosage times. Do not become pregnant while taking this medicine. Women should inform their doctor if they wish to become pregnant or think they might be pregnant. There is a potential for serious side effects to an unborn child. Talk to your health care professional or pharmacist for more information. What side effects may I notice from receiving this medicine? Side effects that you should report to your doctor or health care professional as soon as possible: allergic reactions like skin rash, itching or hives, swelling of the face, lips, or tongue breathing problems changes in vision diarrhea that continues or is severe lump or swelling on the neck severe nausea signs and symptoms of infection like fever or chills; cough; sore throat; pain or trouble passing urine signs and symptoms of low blood sugar such as feeling anxious, confusion, dizziness, increased hunger, unusually weak or tired, sweating, shakiness, cold, irritable, headache, blurred vision, fast heartbeat, loss of consciousness signs and symptoms of kidney injury like trouble passing urine or change in the amount of urine   trouble swallowing unusual stomach upset or pain vomiting Side effects that usually do not require medical attention (report to your doctor or health care professional if they continue or are bothersome): constipation diarrhea nausea pain, redness, or irritation at site where injected stomach upset This list may not describe all possible side effects. Call your doctor for medical advice about side effects. You may report side  effects to FDA at 1-800-FDA-1088. Where should I keep my medicine? Keep out of the reach of children. Store unopened pens in a refrigerator between 2 and 8 degrees C (36 and 46 degrees F). Do not freeze. Protect from light and heat. After you first use the pen, it can be stored for 56 days at room temperature between 15 and 30 degrees C (59 and 86 degrees F) or in a refrigerator. Throw away your used pen after 56 days or after the expiration date, whichever comes first. Do not store your pen with the needle attached. If the needle is left on, medicine may leak from the pen. NOTE: This sheet is a summary. It may not cover all possible information. If you have questions about this medicine, talk to your doctor, pharmacist, or health care provider.  2021 Elsevier/Gold Standard (2019-02-20 09:41:51)  

## 2020-07-10 NOTE — Assessment & Plan Note (Addendum)
Diagnosed in June 2020 with a1c of 16.0.   She prefers to control diabetes with diet but has trouble maintaininga low GI diet, does not tolerate metformin due to persistent nausea and is not normoglycemic on 2.5 mg glipizide.  She has no C/I to Ozempic and is morbidly obese;  Will start after labs have been brought up to date.  Also recommending the Optavia diet

## 2020-07-10 NOTE — Assessment & Plan Note (Signed)
In office visit scheduled for follow up one month.  She reports adherence to amlodipine 10 mg daily regimen.

## 2020-07-10 NOTE — Progress Notes (Signed)
182 post prandial two days ago

## 2020-07-14 ENCOUNTER — Other Ambulatory Visit: Payer: Self-pay

## 2020-07-14 ENCOUNTER — Other Ambulatory Visit (INDEPENDENT_AMBULATORY_CARE_PROVIDER_SITE_OTHER): Payer: Medicare HMO

## 2020-07-14 DIAGNOSIS — E669 Obesity, unspecified: Secondary | ICD-10-CM | POA: Diagnosis not present

## 2020-07-14 DIAGNOSIS — E1169 Type 2 diabetes mellitus with other specified complication: Secondary | ICD-10-CM | POA: Diagnosis not present

## 2020-07-14 DIAGNOSIS — I152 Hypertension secondary to endocrine disorders: Secondary | ICD-10-CM

## 2020-07-14 DIAGNOSIS — E1121 Type 2 diabetes mellitus with diabetic nephropathy: Secondary | ICD-10-CM

## 2020-07-14 DIAGNOSIS — E1159 Type 2 diabetes mellitus with other circulatory complications: Secondary | ICD-10-CM | POA: Diagnosis not present

## 2020-07-14 DIAGNOSIS — E1129 Type 2 diabetes mellitus with other diabetic kidney complication: Secondary | ICD-10-CM

## 2020-07-14 DIAGNOSIS — Z79899 Other long term (current) drug therapy: Secondary | ICD-10-CM

## 2020-07-14 DIAGNOSIS — E119 Type 2 diabetes mellitus without complications: Secondary | ICD-10-CM

## 2020-07-14 DIAGNOSIS — R809 Proteinuria, unspecified: Secondary | ICD-10-CM

## 2020-07-14 LAB — LIPID PANEL
Cholesterol: 147 mg/dL (ref 0–200)
HDL: 40.9 mg/dL (ref >39.00)
LDL Cholesterol: 75 mg/dL (ref 0–99)
NonHDL: 106.22
Total CHOL/HDL Ratio: 4
Triglycerides: 156 mg/dL — ABNORMAL HIGH (ref 0.0–149.0)
VLDL: 31.2 mg/dL (ref 0.0–40.0)

## 2020-07-14 LAB — COMPREHENSIVE METABOLIC PANEL
ALT: 20 U/L (ref 0–35)
AST: 16 U/L (ref 0–37)
Albumin: 4.1 g/dL (ref 3.5–5.2)
Alkaline Phosphatase: 103 U/L (ref 39–117)
BUN: 17 mg/dL (ref 6–23)
CO2: 28 mEq/L (ref 19–32)
Calcium: 9.6 mg/dL (ref 8.4–10.5)
Chloride: 105 mEq/L (ref 96–112)
Creatinine, Ser: 0.79 mg/dL (ref 0.40–1.20)
GFR: 77.58 mL/min (ref >60.00)
Glucose, Bld: 214 mg/dL — ABNORMAL HIGH (ref 70–99)
Potassium: 3.8 mEq/L (ref 3.5–5.1)
Sodium: 140 mEq/L (ref 135–145)
Total Bilirubin: 0.4 mg/dL (ref 0.2–1.2)
Total Protein: 6.5 g/dL (ref 6.0–8.3)

## 2020-07-14 LAB — HEMOGLOBIN A1C: Hgb A1c MFr Bld: 7.7 % — ABNORMAL HIGH (ref 4.6–6.5)

## 2020-07-14 LAB — MICROALBUMIN / CREATININE URINE RATIO
Creatinine,U: 236.9 mg/dL
Microalb Creat Ratio: 21.8 mg/g (ref 0.0–30.0)
Microalb, Ur: 51.6 mg/dL — ABNORMAL HIGH (ref 0.0–1.9)

## 2020-07-15 DIAGNOSIS — E1121 Type 2 diabetes mellitus with diabetic nephropathy: Secondary | ICD-10-CM | POA: Insufficient documentation

## 2020-07-15 MED ORDER — TELMISARTAN 40 MG PO TABS
40.0000 mg | ORAL_TABLET | Freq: Every day | ORAL | 1 refills | Status: DC
Start: 2020-07-15 — End: 2020-09-03

## 2020-07-15 NOTE — Progress Notes (Signed)
Labs reviewed. Here is my advice   A1c up to 7.7,  nyou do need to start the new medication ozempic and continue the metformin and glipizide for now.  If sugars start to go too low (<90 fasting or < 130 2 hrs after eating) ,  you should suspend the glipizide ONLY.  Cholesterol is better.  Continue Crestor   You have microscopic proteinuria.  Microscopic amounts of protein in the urine means that diabetes and/or hypertension is starting to affect your kidney's ability to  filter protein out of the urine . Your current blood pressure medication, The amlodipine , will  NOT address this , but an alternative medication called telmisartan will help prevent  this from getting worse.  Please  make the switch from amlodipine to telmisartan when you are due for your next refill ,  and plan to return for a blood pressure check and a blood test one week after making the change .   Regards,   Duncan Dull, MD

## 2020-07-15 NOTE — Addendum Note (Signed)
Addended by: Sherlene Shams on: 07/15/2020 10:22 AM   Modules accepted: Orders

## 2020-07-15 NOTE — Assessment & Plan Note (Signed)
Adding ozempci for greater control.  Changing amlodipine to telmisartan given new onset proteinuria.  Return after one week of medicatio nchange for BMET and RN visit   Lab Results  Component Value Date   HGBA1C 7.7 (H) 07/14/2020   Lab Results  Component Value Date   MICROALBUR 51.6 (H) 07/14/2020   MICROALBUR 1.6 12/10/2016

## 2020-07-16 ENCOUNTER — Telehealth: Payer: Self-pay | Admitting: Internal Medicine

## 2020-07-16 DIAGNOSIS — I152 Hypertension secondary to endocrine disorders: Secondary | ICD-10-CM

## 2020-07-16 DIAGNOSIS — E1159 Type 2 diabetes mellitus with other circulatory complications: Secondary | ICD-10-CM

## 2020-07-16 MED ORDER — GLIPIZIDE ER 2.5 MG PO TB24: 2.5000 mg | ORAL_TABLET | Freq: Every day | ORAL | 0 refills | Status: DC

## 2020-07-16 NOTE — Telephone Encounter (Signed)
Patient called in about refill for glipiZIDE (GLUCOTROL XL) 2.5 MG 24 hr tablet

## 2020-07-16 NOTE — Telephone Encounter (Signed)
Patient called in to schedule a follow up with Dr.Tullo and because she did not have a appointment in a week she wanted me to send a message to Dr.Tullo she also needs labs

## 2020-07-17 ENCOUNTER — Ambulatory Visit: Payer: Medicare HMO | Admitting: Pharmacist

## 2020-07-17 DIAGNOSIS — I10 Essential (primary) hypertension: Secondary | ICD-10-CM

## 2020-07-17 DIAGNOSIS — E7800 Pure hypercholesterolemia, unspecified: Secondary | ICD-10-CM

## 2020-07-17 DIAGNOSIS — G4733 Obstructive sleep apnea (adult) (pediatric): Secondary | ICD-10-CM | POA: Diagnosis not present

## 2020-07-17 DIAGNOSIS — E1165 Type 2 diabetes mellitus with hyperglycemia: Secondary | ICD-10-CM

## 2020-07-17 NOTE — Telephone Encounter (Signed)
I scheduled patient for nurse visit for BP check and non fasting labs for BMP s/p initiation of telmisartan next week   Dr. Darrick Huntsman, patient and I have a follow up call in 4 weeks (about 4 weeks after starting Ozempic). Would you still like to see her back in 4 weeks as well? Or later?

## 2020-07-17 NOTE — Telephone Encounter (Signed)
4 week follow up with me is not needed if she is following up with you,  Thank you Catie

## 2020-07-17 NOTE — Patient Instructions (Signed)
Visit Information  Patient Care Plan: Medication Management    Problem Identified: DM, Obesity, HLD, HTN     Long-Range Goal: Disease Progression Prevention   Start Date: 07/17/2020  This Visit's Progress: On track  Priority: High  Note:   Current Barriers:  . Unable to achieve control of diabetes, weight   Pharmacist Clinical Goal(s):  Marland Kitchen Over the next 90 days, patient will achieve control of diabetes as evidenced by A1c  through collaboration with PharmD and provider.   Interventions: . 1:1 collaboration with Crecencio Mc, MD regarding development and update of comprehensive plan of care as evidenced by provider attestation and co-signature . Inter-disciplinary care team collaboration (see longitudinal plan of care) . Comprehensive medication review performed; medication list updated in electronic medical record  Diabetes: . Uncontrolled; current treatment: glipizide XL 5 mg QAM- prescribed Ozempic 0.25 mg weekly with dose reduction of glipizide to 2.5 mg QAM, but patient has not started yet  o Hx metformin: nausea, diarrhea  . Educated on goal A1c, goal fasting, and goal 2 hour post prandial glucose . Educated on mechanism of GLP1. Discussed long term benefits of weight loss, CV benefit. Encouraged to increase fiber and water intake to help mitigate her concerns of constipation. Encouraged to avoiding eating late at night and reduce spicy/fatty foods to mitigate her concerns of acid reflux.  . Discussed injection technique. Patient notes she watched the [employer] 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 If she is uncomfortable doing first injection on her own, she will bring for teaching at upcoming nurse BP check.  . Educated on goal of titration of Ozempic to improve possibility of weight loss and eliminate glipizide.   Hypertension: . Controlled; current treatment: telmisartan 40 mg daily - previously on amlodipine 10 mg daily, but due to new  proteinuria, PCP changed to [facility].  Marland Kitchen Current home readings: has not checked at home since regimen changed . Educated on benefit of RAAS agents on proteinuria in diabetes. Assisted in scheduling for RN visit for BP check, lab visit for BMP.   Hyperlipidemia: . Controlled; current treatment: rosuvastatin 5 mg daily   . Recommended to continue current regimen at this time  Patient Goals/Self-Care Activities . Over the next 90 days, patient will:  - take medications as prescribed check glucose daily, document, and provide at future appointments check blood pressure periodically, document, and provide at future appointments  Follow Up Plan: Telephone follow up appointment with care management team member scheduled for: ~ 4 weeks      Ms. Valdivia was given information about Chronic Care Management services today including:  1. CCM service includes personalized support from designated clinical staff supervised by her physician, including individualized plan of care and coordination with other care providers 2. 24/7 contact phone numbers for assistance for urgent and routine care needs. 3. Service will only be billed when office clinical staff spend 20 minutes or more in a month to coordinate care. 4. Only one practitioner may furnish and bill the service in a calendar month. 5. The patient may stop CCM services at any time (effective at the end of the month) by phone call to the office staff. 6. The patient will be responsible for cost sharing (co-pay) of up to 20% of the service fee (after annual deductible is met).  Patient agreed to services and verbal consent obtained.   Patient verbalizes understanding of instructions provided today and agrees to view in [place].  Plan: Telephone follow up appointment with care management team member scheduled for:  ~ 4 weeks  Catie Darnelle Maffucci, PharmD, [place], [place] Clinical Pharmacist [employer] [facility] [employer] [phone]

## 2020-07-17 NOTE — Chronic Care Management (AMB) (Signed)
Chronic Care Management Pharmacy Note  07/17/2020 Name:  Penny Butler MRN:  [id number] DOB:  Jul 05, 1950  Subjective: Penny Butler is an 70 y.o. year old female who is a primary patient of Penny Butler, Penny Everts, MD.  The CCM team was consulted for assistance with disease management and care coordination needs.    Engaged with patient by telephone for initial visit in response to provider referral for pharmacy case management and/or care coordination services.   Consent to Services:  The patient was given the following information about Chronic Care Management services today, agreed to services, and gave verbal consent: 1. CCM service includes personalized support from designated clinical staff supervised by the primary care provider, including individualized plan of care and coordination with other care providers 2. 24/7 contact phone numbers for assistance for urgent and routine care needs. 3. Service will only be billed when office clinical staff spend 20 minutes or more in a month to coordinate care. 4. Only one practitioner may furnish and bill the service in a calendar month. 5.The patient may stop CCM services at any time (effective at the end of the month) by phone call to the office staff. 6. The patient will be responsible for cost sharing (co-pay) of up to 20% of the service fee (after annual deductible is met). Patient agreed to services and consent obtained.  Objective:  Lab Results  Component Value Date   CREATININE 0.79 07/14/2020   CREATININE 0.78 03/03/2020   CREATININE 0.80 04/10/2019    Lab Results  Component Value Date   HGBA1C 7.7 (H) 07/14/2020       Component Value Date/Time   CHOL 147 07/14/2020 0931   CHOL 214 (H) 07/06/2019 1201   TRIG 156.0 (H) 07/14/2020 0931   HDL 40.90 07/14/2020 0931   HDL 40 07/06/2019 1201   CHOLHDL 4 07/14/2020 0931   VLDL 31.2 07/14/2020 0931   LDLCALC 75 07/14/2020 0931   LDLCALC 130 (H) 07/06/2019 1201   LDLDIRECT 66.0  11/22/2018 1007    Clinical ASCVD: No  The 10-year ASCVD risk score Mikey Bussing DC Jr., et al., 2013) is: 21%   Values used to calculate the score:     Age: 62 years     Sex: Female     Is Non-Hispanic African American: Yes     Diabetic: Yes     Tobacco smoker: No     Systolic Blood Pressure: 376 mmHg     Is BP treated: Yes     HDL Cholesterol: 40.9 mg/dL     Total Cholesterol: 147 mg/dL    BP Readings from Last 3 Encounters:  03/05/20 138/80  01/03/20 124/86  07/06/19 (!) 146/86    Assessment: Review of patient past medical history, allergies, medications, health status, including review of consultants reports, laboratory and other test data, was performed as part of comprehensive evaluation and provision of chronic care management services.   SDOH:  (Social Determinants of Health) assessments and interventions performed:    CCM Care Plan  Allergies  Allergen Reactions  . Citalopram Other (See Comments)    Sick feeling and abdominal pain  . Codeine Nausea Only    hallucinations    Medications Reviewed Today    Reviewed by De Hollingshead, RPH-CPP (Pharmacist) on 07/17/20 at 1539  Med List Status: <None>  Medication Order Taking? Sig Documenting Provider Last Dose Status Informant  glipiZIDE (GLUCOTROL XL) 2.5 MG 24 hr tablet [id number] Yes Take 1 tablet (2.5 mg total) by mouth  daily with breakfast. Penny Mc, MD Taking Active   glucose blood [place] VERIO) test strip [id number] Yes USE AS DIRECTED Once per   Penny Balboa, MD Taking Active   OneTouch Delica Lancets 93Y MISC [id number] Yes USE AS DIRECTED EVERY DAY Penny Mc, MD Taking Active   rosuvastatin (CRESTOR) 5 MG tablet [id number] Yes Take 1 tablet (5 mg total) by mouth daily. Penny Mc, MD Taking Active   Semaglutide,0.25 or 0.5MG/DOS, (OZEMPIC, 0.25 OR 0.5 MG/DOSE,) 2 MG/1.5ML SOPN [id number] No Inject 0.25 mg into the skin once a week.  Patient not taking: Reported on 07/17/2020   Penny Mc, MD Not Taking Active   telmisartan (MICARDIS) 40 MG tablet [id number] Yes Take 1 tablet (40 mg total) by mouth at bedtime. Penny Mc, MD Taking Active           Patient Active Problem List   Diagnosis Date Noted  . Diabetes mellitus with proteinuric diabetic nephropathy ([place]) 07/15/2020  . Peripheral vascular disease, asymptomatic ([place]) 05/07/2020  . Breast lump or mass 01/03/2020  . Grief reaction 01/03/2020  . Morbid obesity ([place]) 04/23/2019  . Obesity, diabetes, and hypertension syndrome ([place]) 11/23/2018  . Nocturnal leg cramps 11/07/2018  . Personal history of kidney stones 03/11/2018  . Encounter for preventive health examination 01/17/2018  . GAD (generalized anxiety disorder) 12/31/2014  . Menopause syndrome 08/11/2013  . S/P TAH-BSO (total abdominal hysterectomy and bilateral salpingo-oophorectomy) 08/11/2013  . OSA (obstructive sleep apnea)   . Hyperlipidemia 06/01/2011  . Hypertension 06/01/2011    Conditions to be addressed/monitored: HTN, HLD and DMII  Care Plan : Medication Management  Updates made by De Hollingshead, RPH-CPP since 07/17/2020 12:00 AM    Problem: DM, Obesity, HLD, HTN     Long-Range Goal: Disease Progression Prevention   Start Date: 07/17/2020  This Visit's Progress: On track  Priority: High  Note:   Current Barriers:  . Unable to achieve control of diabetes, weight   Pharmacist Clinical Goal(s):  Marland Kitchen Over the next 90 days, patient will achieve control of diabetes as evidenced by A1c  through collaboration with PharmD and provider.   Interventions: . 1:1 collaboration with Penny Mc, MD regarding development and update of comprehensive plan of care as evidenced by provider attestation and co-signature . Inter-disciplinary care team collaboration (see longitudinal plan of care) . Comprehensive medication review performed; medication list updated in electronic medical record  Diabetes: . Uncontrolled; current  treatment: glipizide XL 5 mg QAM- prescribed Ozempic 0.25 mg weekly with dose reduction of glipizide to 2.5 mg QAM, but patient has not started yet  o Hx metformin: nausea, diarrhea  . Educated on goal A1c, goal fasting, and goal 2 hour post prandial glucose . Educated on mechanism of GLP1. Discussed long term benefits of weight loss, CV benefit. Encouraged to increase fiber and water intake to help mitigate her concerns of constipation. Encouraged to avoiding eating late at night and reduce spicy/fatty foods to mitigate her concerns of acid reflux.  . Discussed injection technique. Patient notes she watched the [employer] 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 If she is uncomfortable doing first injection on her own, she will bring for teaching at upcoming nurse BP check.  . Educated on goal of titration of Ozempic to improve possibility of weight loss and eliminate glipizide.   Hypertension: . Controlled; current treatment: telmisartan 40 mg daily - previously on amlodipine 10 mg  daily, but due to new proteinuria, PCP changed to [facility].  Marland Kitchen Current home readings: has not checked at home since regimen changed . Educated on benefit of RAAS agents on proteinuria in diabetes. Assisted in scheduling for RN visit for BP check, lab visit for BMP.   Hyperlipidemia: . Controlled; current treatment: rosuvastatin 5 mg daily   . Recommended to continue current regimen at this time  Patient Goals/Self-Care Activities . Over the next 90 days, patient will:  - take medications as prescribed check glucose daily, document, and provide at future appointments check blood pressure periodically, document, and provide at future appointments  Follow Up Plan: Telephone follow up appointment with care management team member scheduled for: ~ 4 weeks      Medication Assistance: None required.  Patient affirms current coverage meets needs. Discussed patient assistance eligibility.  Patient eligible for Ozempic assistance. Will pursue once patient on stable dose  Follow Up:  Patient agrees to Care Plan and Follow-up.  Plan: Telephone follow up appointment with care management team member scheduled for:  ~ 4 weeks  Catie Darnelle Maffucci, PharmD, [place], [place] Clinical Pharmacist [employer] [facility] [employer] [phone]

## 2020-07-23 ENCOUNTER — Other Ambulatory Visit: Payer: Self-pay

## 2020-07-23 ENCOUNTER — Other Ambulatory Visit (INDEPENDENT_AMBULATORY_CARE_PROVIDER_SITE_OTHER): Payer: Medicare HMO

## 2020-07-23 ENCOUNTER — Ambulatory Visit: Payer: Medicare HMO

## 2020-07-23 VITALS — BP 152/84 | HR 89

## 2020-07-23 DIAGNOSIS — E1121 Type 2 diabetes mellitus with diabetic nephropathy: Secondary | ICD-10-CM

## 2020-07-23 DIAGNOSIS — I10 Essential (primary) hypertension: Secondary | ICD-10-CM

## 2020-07-23 NOTE — Progress Notes (Signed)
Patient is here for a BP check due to bp being high at last visit, as per patient.  Currently patients BP is 185/90 and BPM is 101. Ten minutes later BP is 152/84 and BPM is 89.  Patient has no complaints of headaches, blurry vision, chest pain, arm pain, light headedness, dizziness, and nor jaw pain. Please see previous note for order.

## 2020-07-24 LAB — BASIC METABOLIC PANEL
BUN: 19 mg/dL (ref 6–23)
CO2: 26 mEq/L (ref 19–32)
Calcium: 9.5 mg/dL (ref 8.4–10.5)
Chloride: 107 mEq/L (ref 96–112)
Creatinine, Ser: 0.94 mg/dL (ref 0.40–1.20)
GFR: 62.96 mL/min (ref >60.00)
Glucose, Bld: 175 mg/dL — ABNORMAL HIGH (ref 70–99)
Potassium: 3.9 mEq/L (ref 3.5–5.1)
Sodium: 140 mEq/L (ref 135–145)

## 2020-07-25 ENCOUNTER — Telehealth: Payer: Self-pay

## 2020-07-25 NOTE — Telephone Encounter (Signed)
Called and spoke to [place]. She would like to keep the 10:00 appointment and be called at 10:10.

## 2020-07-25 NOTE — Telephone Encounter (Signed)
I called Penny Butler for lab results and she had a question for Penny Butler over her appointment on 05/27/2021. Penny Butler states that her appointment is at 10 and she does not finish teaching unitl 10:05. She requests that Penny Butler call her at 10:10. When explaining this, she saw on mychart that it was scheduled for 9:45 and she wanted to ask if it can be done for 10:10 instead.   I spoke with Penny Butler, and Penny Butler offers a 11:30 appointment for the patient to not feel rushed, If not than the 10:00 appointment can be kept.

## 2020-07-26 ENCOUNTER — Other Ambulatory Visit: Payer: Self-pay | Admitting: Internal Medicine

## 2020-07-26 MED ORDER — HYDROCHLOROTHIAZIDE 25 MG PO TABS: 25.0000 mg | ORAL_TABLET | Freq: Every day | ORAL | 0 refills | Status: DC

## 2020-07-26 NOTE — Progress Notes (Signed)
You   will need to add hctz in the morning and continue nighttime telmisartan.  Medication will be sent to pharmacy.  You will need to start checking your blood pressure after a week of being on both medications  and bring both your BP monitor AND your readings to your upcoming visit with me  I cannot refer your husband to the Chronic Care management team until he has followed up with me because he has not followed up with me for his diabetes etc in over a year, (his last visit was Nov 2020).  Please encourage him to schedule a visit.

## 2020-07-28 ENCOUNTER — Ambulatory Visit (INDEPENDENT_AMBULATORY_CARE_PROVIDER_SITE_OTHER): Payer: Medicare HMO

## 2020-07-28 VITALS — Ht 61.0 in | Wt 193.0 lb

## 2020-07-28 DIAGNOSIS — Z0000 Encounter for general adult medical examination without abnormal findings: Secondary | ICD-10-CM

## 2020-07-28 NOTE — Patient Instructions (Addendum)
Ms. Simkin , Thank you for taking time to come for your Medicare Wellness Visit. I appreciate your ongoing commitment to your health goals. Please review the following plan we discussed and let me know if I can assist you in the future.   These are the goals we discussed: Goals      Patient Stated   .  Medication Monitoring (pt-stated)      Patient Goals/Self-Care Activities . Over the next 90 days, patient will:  - take medications as prescribed check glucose daily, document, and provide at future appointments check blood pressure periodically, document, and provide at future appointments      Other   .  Follow up with Primary Care Provider      Routine maintenance       This is a list of the screening recommended for you and due dates:  Health Maintenance  Topic Date Due  . Complete foot exam   Never done  . Eye exam for diabetics  Never done  . Flu Shot  09/18/2020*  . Tetanus Vaccine  07/28/2021*  . Pneumonia vaccines (1 of 2 - PCV13) 07/28/2021*  . Mammogram  01/10/2021  . Hemoglobin A1C  01/11/2021  . Colon Cancer Screening  12/25/2023  . DEXA scan (bone density measurement)  Completed  . COVID-19 Vaccine  Completed  .  Hepatitis C: One time screening is recommended by Center for Disease Control  (CDC) for  adults born from [birth year] through 1963.   Completed  *Topic was postponed. The date shown is not the original due date.   Advanced directives: not yet completed  Conditions/risks identified: none new  Follow up in one year for your annual wellness visit.   Preventive Care 8 Years and Older, Female Preventive care refers to lifestyle choices and visits with your health care provider that can promote health and wellness. What does preventive care include?  A yearly physical exam. This is also called an annual well check.  Dental exams once or twice a year.  Routine eye exams. Ask your health care provider how often you should have your eyes  checked.  Personal lifestyle choices, including:  Daily care of your teeth and gums.  Regular physical activity.  Eating a healthy diet.  Avoiding tobacco and drug use.  Limiting alcohol use.  Practicing safe sex.  Taking low-dose aspirin every day.  Taking vitamin and mineral supplements as recommended by your health care provider. What happens during an annual well check? The services and screenings done by your health care provider during your annual well check will depend on your age, overall health, lifestyle risk factors, and family history of disease. Counseling  Your health care provider may ask you questions about your:  Alcohol use.  Tobacco use.  Drug use.  Emotional well-being.  Home and relationship well-being.  Sexual activity.  Eating habits.  History of falls.  Memory and ability to understand (cognition).  Work and work Astronomer.  Reproductive health. Screening  You may have the following tests or measurements:  Height, weight, and BMI.  Blood pressure.  Lipid and cholesterol levels. These may be checked every 5 years, or more frequently if you are over 37 years old.  Skin check.  Lung cancer screening. You may have this screening every year starting at age 23 if you have a 30-pack-year history of smoking and currently smoke or have quit within the past 15 years.  Fecal occult blood test (FOBT) of the stool. You may have  this test every year starting at age 29.  Flexible sigmoidoscopy or colonoscopy. You may have a sigmoidoscopy every 5 years or a colonoscopy every 10 years starting at age 9.  Hepatitis C blood test.  Hepatitis B blood test.  Sexually transmitted disease (STD) testing.  Diabetes screening. This is done by checking your blood sugar (glucose) after you have not eaten for a while (fasting). You may have this done every 1-3 years.  Bone density scan. This is done to screen for osteoporosis. You may have this done  starting at age 107.  Mammogram. This may be done every 1-2 years. Talk to your health care provider about how often you should have regular mammograms. Talk with your health care provider about your test results, treatment options, and if necessary, the need for more tests. Vaccines  Your health care provider may recommend certain vaccines, such as:  Influenza vaccine. This is recommended every year.  Tetanus, diphtheria, and acellular pertussis (Tdap, Td) vaccine. You may need a Td booster every 10 years.  Zoster vaccine. You may need this after age 19.  Pneumococcal 13-valent conjugate (PCV13) vaccine. One dose is recommended after age 71.  Pneumococcal polysaccharide (PPSV23) vaccine. One dose is recommended after age 69. Talk to your health care provider about which screenings and vaccines you need and how often you need them. This information is not intended to replace advice given to you by your health care provider. Make sure you discuss any questions you have with your health care provider. Document Released: 07/04/2015 Document Revised: 02/25/2016 Document Reviewed: 04/08/2015 Elsevier Interactive Patient Education  2017 [employer].  Fall Prevention in the Home Falls can cause injuries. They can happen to people of all ages. There are many things you can do to make your home safe and to help prevent falls. What can I do on the outside of my home?  Regularly fix the edges of walkways and driveways and fix any cracks.  Remove anything that might make you trip as you walk through a door, such as a raised step or threshold.  Trim any bushes or trees on the path to your home.  Use bright outdoor lighting.  Clear any walking paths of anything that might make someone trip, such as rocks or tools.  Regularly check to see if handrails are loose or broken. Make sure that both sides of any steps have handrails.  Any raised decks and porches should have guardrails on the  edges.  Have any leaves, snow, or ice cleared regularly.  Use sand or salt on walking paths during winter.  Clean up any spills in your garage right away. This includes oil or grease spills. What can I do in the bathroom?  Use night lights.  Install grab bars by the toilet and in the tub and shower. Do not use towel bars as grab bars.  Use non-skid mats or decals in the tub or shower.  If you need to sit down in the shower, use a plastic, non-slip stool.  Keep the floor dry. Clean up any water that spills on the floor as soon as it happens.  Remove soap buildup in the tub or shower regularly.  Attach bath mats securely with double-sided non-slip rug tape.  Do not have throw rugs and other things on the floor that can make you trip. What can I do in the bedroom?  Use night lights.  Make sure that you have a light by your bed that is easy to  reach.  Do not use any sheets or blankets that are too big for your bed. They should not hang down onto the floor.  Have a firm chair that has side arms. You can use this for support while you get dressed.  Do not have throw rugs and other things on the floor that can make you trip. What can I do in the kitchen?  Clean up any spills right away.  Avoid walking on wet floors.  Keep items that you use a lot in easy-to-reach places.  If you need to reach something above you, use a strong step stool that has a grab bar.  Keep electrical cords out of the way.  Do not use floor polish or wax that makes floors slippery. If you must use wax, use non-skid floor wax.  Do not have throw rugs and other things on the floor that can make you trip. What can I do with my stairs?  Do not leave any items on the stairs.  Make sure that there are handrails on both sides of the stairs and use them. Fix handrails that are broken or loose. Make sure that handrails are as long as the stairways.  Check any carpeting to make sure that it is firmly  attached to the stairs. Fix any carpet that is loose or worn.  Avoid having throw rugs at the top or bottom of the stairs. If you do have throw rugs, attach them to the floor with carpet tape.  Make sure that you have a light switch at the top of the stairs and the bottom of the stairs. If you do not have them, ask someone to add them for you. What else can I do to help prevent falls?  Wear shoes that:  Do not have high heels.  Have rubber bottoms.  Are comfortable and fit you well.  Are closed at the toe. Do not wear sandals.  If you use a stepladder:  Make sure that it is fully opened. Do not climb a closed stepladder.  Make sure that both sides of the stepladder are locked into place.  Ask someone to hold it for you, if possible.  Clearly mark and make sure that you can see:  Any grab bars or handrails.  First and last steps.  Where the edge of each step is.  Use tools that help you move around (mobility aids) if they are needed. These include:  Canes.  Walkers.  Scooters.  Crutches.  Turn on the lights when you go into a dark area. Replace any light bulbs as soon as they burn out.  Set up your furniture so you have a clear path. Avoid moving your furniture around.  If any of your floors are uneven, fix them.  If there are any pets around you, be aware of where they are.  Review your medicines with your doctor. Some medicines can make you feel dizzy. This can increase your chance of falling. Ask your doctor what other things that you can do to help prevent falls. This information is not intended to replace advice given to you by your health care provider. Make sure you discuss any questions you have with your health care provider. Document Released: 04/03/2009 Document Revised: 11/13/2015 Document Reviewed: 07/12/2014 Elsevier Interactive Patient Education  2017 [employer].

## 2020-07-28 NOTE — Progress Notes (Addendum)
Subjective:   Penny Butler is a 70 y.o. female who presents for Medicare Annual (Subsequent) preventive examination.  Review of Systems    No ROS.  Medicare Wellness Virtual Visit.   Cardiac Risk Factors include: diabetes mellitus;advanced age (>47men, >18 women);hypertension     Objective:    Today's Vitals   07/28/20 1003  Weight: 193 lb (87.5 kg)  Height: 5\' 1"  (1.549 m)   Body mass index is 36.47 kg/m.  Advanced Directives 07/28/2020 07/26/2019  Does Patient Have a Medical Advance Directive? No No  Would patient like information on creating a medical advance directive? No - Patient declined No - Patient declined    Current Medications (verified) Outpatient Encounter Medications as of 07/28/2020  Medication Sig   glipiZIDE (GLUCOTROL XL) 2.5 MG 24 hr tablet Take 1 tablet (2.5 mg total) by mouth daily with breakfast.   glucose blood (ONETOUCH VERIO) test strip USE AS DIRECTED Once per   DAY   hydrochlorothiazide (HYDRODIURIL) 25 MG tablet Take 1 tablet (25 mg total) by mouth daily.   OneTouch Delica Lancets 30G MISC USE AS DIRECTED EVERY DAY   rosuvastatin (CRESTOR) 5 MG tablet Take 1 tablet (5 mg total) by mouth daily.   Semaglutide,0.25 or 0.5MG /DOS, (OZEMPIC, 0.25 OR 0.5 MG/DOSE,) 2 MG/1.5ML SOPN Inject 0.25 mg into the skin once a week. (Patient not taking: Reported on 07/17/2020)   telmisartan (MICARDIS) 40 MG tablet Take 1 tablet (40 mg total) by mouth at bedtime.   No facility-administered encounter medications on file as of 07/28/2020.    Allergies (verified) Citalopram and Codeine   History: Past Medical History:  Diagnosis Date   Hyperlipidemia    Hypertension    Obesity    OSA (obstructive sleep apnea)    now off of CAPAP without symptoms    Type 2 diabetes mellitus (HCC)    Past Surgical History:  Procedure Laterality Date   ABDOMINAL HYSTERECTOMY     BILATERAL OOPHORECTOMY  2007   [place]   Family History  Problem Relation Age of Onset    Breast cancer Sister 56   Heart disease Mother    Lung cancer Mother    Heart disease Father 16   Lung cancer Father    Social History   Socioeconomic History   Marital status: Married    Spouse name: Not on file   Number of children: Not on file   Years of education: Not on file   Highest education level: Not on file  Occupational History   Not on file  Tobacco Use   Smoking status: Never Smoker   Smokeless tobacco: Never Used  Vaping Use   Vaping Use: Former  Substance and Sexual Activity   Alcohol use: Not Currently    Comment: Few drinks a year   Drug use: No   Sexual activity: Yes  Other Topics Concern   Not on file  Social History Narrative   Not on file   Social Determinants of Health   Financial Resource Strain: Low Risk    Difficulty of Paying Living Expenses: Not hard at all  Food Insecurity: No Food Insecurity   Worried About 46 in the Last Year: Never true   Ran Out of Food in the Last Year: Never true  Transportation Needs: No Transportation Needs   Lack of Transportation (Medical): No   Lack of Transportation (Non-Medical): No  Physical Activity: Sufficiently Active   Days of Exercise per Week: 7 days  Minutes of Exercise per Session: 30 min  Stress: No Stress Concern Present   Feeling of Stress : Only a little  Social Connections: Unknown   Frequency of Communication with Friends and Family: Not on file   Frequency of Social Gatherings with Friends and Family: Not on file   Attends Religious Services: Not on Scientist, clinical (histocompatibility and immunogenetics) or Organizations: Not on file   Attends Banker Meetings: Not on file   Marital Status: Married    Tobacco Counseling Counseling given: Not Answered   Clinical Intake:  Pre-visit preparation completed: Yes        Diabetes: Yes (Followed by pcp)  How often do you need to have someone help you when you read instructions, pamphlets, or other written materials from your  doctor or pharmacy?: 1 - Never Nutrition Risk Assessment: Has the patient had any N/V/D within the last 2 weeks?  No  Does the patient have any non-healing wounds?  No  Has the patient had any unintentional weight loss or weight gain?  No   Diabetes: If diabetic, was a CBG obtained today?  No  Did the patient bring in their glucometer from home?  No . Virtual visit.  How often do you monitor your CBG's? 1-2 weekly.   Financial Strains and Diabetes Management: Are you having any financial strains with the device, your supplies or your medication? No .  Is the patient seen by Chronic Care Management for management of their diabetes?  Yes   Diabetic Exams: Diabetic eye exam- plans to schedule.  Diabetic foot exam- followed by pcp. Reports no change.    Interpreter Needed?: No     Activities of Daily Living In your present state of health, do you have any difficulty performing the following activities: 07/28/2020  Hearing? N  Vision? N  Difficulty concentrating or making decisions? N  Walking or climbing stairs? N  Dressing or bathing? N  Doing errands, shopping? N  Preparing Food and eating ? N  Using the Toilet? N  In the past six months, have you accidently leaked urine? N  Do you have problems with loss of bowel control? N  Managing your Medications? N  Managing your Finances? N  Housekeeping or managing your Housekeeping? N  Some recent data might be hidden    Patient Care Team: Sherlene Shams, MD as PCP - General (Internal Medicine)  Indicate any recent Medical Services you may have received from other than [facility] providers in the past year (date may be approximate).     Assessment:   This is a routine wellness examination for Penny Butler.  I connected with Penny Butler today by telephone and verified that I am speaking with the correct person using two identifiers. Location patient: home Location provider: work Persons participating in the virtual visit: patient, Engineer, civil (consulting).     I discussed the limitations, risks, security and privacy concerns of performing an evaluation and management service by telephone and the availability of in person appointments. The patient expressed understanding and verbally consented to this telephonic visit.    Interactive audio and video telecommunications were attempted between this provider and patient, however failed, due to patient having technical difficulties OR patient did not have access to video capability.  We continued and completed visit with audio only.  Some vital signs may be absent or patient reported.   Hearing/Vision screen  Hearing Screening   125Hz  250Hz  500Hz  1000Hz  2000Hz  3000Hz  4000Hz  6000Hz  8000Hz   Right ear:  Left ear:           Comments: Patient is able to hear conversational tones without difficulty.  No issues reported.  Vision Screening Comments: Wears corrective lenses Visual acuity not assessed, virtual visit.  They have seen their ophthalmologist in the last 12 months.     Dietary issues and exercise activities discussed: Current Exercise Habits: Home exercise routine, Intensity: Mild  Goals       Patient Stated     Medication Monitoring (pt-stated)      Patient Goals/Self-Care Activities Over the next 90 days, patient will:  - take medications as prescribed check glucose daily, document, and provide at future appointments check blood pressure periodically, document, and provide at future appointments      Other     Follow up with Primary Care Provider      Routine maintenance       Depression Screen PHQ 2/9 Scores 07/28/2020 07/26/2019 01/16/2018  PHQ - 2 Score 0 0 0  PHQ- 9 Score - - 0    Fall Risk Fall Risk  07/28/2020 07/10/2020 03/05/2020 01/03/2020 07/26/2019  Falls in the past year? 0 0 0 0 0  Number falls in past yr: 0 - - - -  Injury with Fall? 0 - - - -  Follow up Falls evaluation completed Falls evaluation completed Falls evaluation completed Falls evaluation  completed Falls evaluation completed    FALL RISK PREVENTION PERTAINING TO THE HOME: Handrails in use when climbing stairs? Yes Home free of loose throw rugs in walkways, pet beds, electrical cords, etc? Yes   Adequate lighting in your home to reduce risk of falls? Yes   ASSISTIVE DEVICES UTILIZED TO PREVENT FALLS: Use of a cane, walker or w/c? No   TIMED UP AND GO: Was the test performed? No . Virtual visit.   Cognitive Function:  Patient is alert and oriented x3.  Denies difficulty focusing, making decisions, memory loss.  Enjoys substitute teaching.  MMSE/6CIT deferred. Normal by direct communication/observation.    6CIT Screen 07/26/2019  What Year? 0 points  What month? 0 points  What time? 0 points    Immunizations Immunization History  Administered Date(s) Administered   Janssen (J&J) SARS-COV-2 Vaccination 11/29/2019   PFIZER(Purple Top)SARS-COV-2 Vaccination 06/11/2020   Zoster Recombinat (Shingrix) 07/26/2018, 12/15/2018    TDAP status: Due, Education has been provided regarding the importance of this vaccine. Advised may receive this vaccine at local pharmacy or Health Dept. Aware to provide a copy of the vaccination record if obtained from local pharmacy or Health Dept. Verbalized acceptance and understanding. Deferred.   Health Maintenance Health Maintenance  Topic Date Due   FOOT EXAM  Never done   OPHTHALMOLOGY EXAM  Never done   INFLUENZA VACCINE  09/18/2020 (Originally 01/20/2020)   TETANUS/TDAP  07/28/2021 (Originally [birth year]/1972)   PNA vac Low Risk Adult (1 of 2 - PCV13) 07/28/2021 (Originally 06/12/2018)   MAMMOGRAM  01/10/2021   HEMOGLOBIN A1C  01/11/2021   COLONOSCOPY (Pts 45-21yrs Insurance coverage will need to be confirmed)  12/25/2023   DEXA SCAN  Completed   COVID-19 Vaccine  Completed   Hepatitis C Screening  Completed   Colorectal cancer screening: Type of screening: Colonoscopy. Completed 12/24/13. Repeat every 10 years  Mammogram status:  Completed 01/11/20. Repeat every year  Bone density- 09/27/19  Lung Cancer Screening: (Low Dose CT Chest recommended if Age 53-80 years, 30 pack-year currently smoking OR have quit w/in 15years.) does not qualify.   Dental Screening: Recommended  annual dental exams for proper oral hygiene.  Community Resource Referral / Chronic Care Management: CRR required this visit?  No   CCM required this visit?  No      Plan:   Keep all routine maintenance appointments.   CCM 08/13/20 @ 3:45   Follow up 08/22/20 @ 4:00  I have personally reviewed and noted the following in the patient's chart:   Medical and social history Use of alcohol, tobacco or illicit drugs  Current medications and supplements Functional ability and status Nutritional status Physical activity Advanced directives List of other physicians Hospitalizations, surgeries, and ER visits in previous 12 months Vitals Screenings to include cognitive, depression, and falls Referrals and appointments  In addition, I have reviewed and discussed with patient certain preventive protocols, quality metrics, and best practice recommendations. A written personalized care plan for preventive services as well as general preventive health recommendations were provided to patient via mychart.     OBrien-Blaney, Aragorn Recker L, LPN   01/23/1659     I have reviewed the above information and agree with above.   Duncan Dull, MD

## 2020-08-07 ENCOUNTER — Other Ambulatory Visit: Payer: Self-pay | Admitting: Internal Medicine

## 2020-08-07 DIAGNOSIS — I152 Hypertension secondary to endocrine disorders: Secondary | ICD-10-CM

## 2020-08-07 DIAGNOSIS — E1159 Type 2 diabetes mellitus with other circulatory complications: Secondary | ICD-10-CM

## 2020-08-07 MED ORDER — GLIPIZIDE ER 2.5 MG PO TB24: 2.5000 mg | ORAL_TABLET | Freq: Every day | ORAL | 0 refills | Status: DC

## 2020-08-07 NOTE — Addendum Note (Signed)
Addended byElise Benne T on: 08/07/2020 03:57 PM   Modules accepted: Orders

## 2020-08-07 NOTE — Telephone Encounter (Signed)
Pt needs a refill on glipiZIDE (GLUCOTROL XL) 2.5 MG 24 hr tablet 90 day supply  Pt is completely out

## 2020-08-13 ENCOUNTER — Ambulatory Visit (INDEPENDENT_AMBULATORY_CARE_PROVIDER_SITE_OTHER): Payer: Medicare HMO | Admitting: Pharmacist

## 2020-08-13 DIAGNOSIS — E1159 Type 2 diabetes mellitus with other circulatory complications: Secondary | ICD-10-CM | POA: Diagnosis not present

## 2020-08-13 DIAGNOSIS — E1165 Type 2 diabetes mellitus with hyperglycemia: Secondary | ICD-10-CM | POA: Diagnosis not present

## 2020-08-13 DIAGNOSIS — E7800 Pure hypercholesterolemia, unspecified: Secondary | ICD-10-CM | POA: Diagnosis not present

## 2020-08-13 DIAGNOSIS — I152 Hypertension secondary to endocrine disorders: Secondary | ICD-10-CM

## 2020-08-13 NOTE — Patient Instructions (Signed)
Visit Information  PATIENT GOALS: Goals Addressed              This Visit's Progress     Patient Stated   .  Medication Monitoring (pt-stated)        Patient Goals/Self-Care Activities . Over the next 90 days, patient will:  - take medications as prescribed check glucose daily, document, and provide at future appointments check blood pressure periodically, document, and provide at future appointments        Patient verbalizes understanding of instructions provided today and agrees to view in MyChart.   Plan: Telephone follow up appointment with care management team member scheduled for:  ~ 6 weeks  Catie Feliz Beam, PharmD, [place], CPP Clinical Pharmacist [employer] at [employer] [phone]

## 2020-08-13 NOTE — Progress Notes (Signed)
Chronic Care Management Pharmacy Note  08/13/2020 Name:  Penny Butler MRN:  [id number] DOB:  Mar 24, 1951  Subjective: Penny Butler is an 70 y.o. year old female who is a primary patient of Derrel Nip, Aris Everts, MD.  The CCM team was consulted for assistance with disease management and care coordination needs.    Engaged with patient by telephone for follow up visit in response to provider referral for pharmacy case management and/or care coordination services.   Consent to Services:  The patient was given information about Chronic Care Management services, agreed to services, and gave verbal consent prior to initiation of services.  Please see initial visit note for detailed documentation.   Patient Care Team: Crecencio Mc, MD as PCP - General (Internal Medicine)  Recent office visits: None since our last call  Recent consult visits: None since our last call  Hospital visits: None in previous 6 months  Objective:  Lab Results  Component Value Date   CREATININE 0.94 07/23/2020   BUN 19 07/23/2020   GFR 62.96 07/23/2020   NA 140 07/23/2020   K 3.9 07/23/2020   CALCIUM 9.5 07/23/2020   CO2 26 07/23/2020    Lab Results  Component Value Date/Time   HGBA1C 7.7 (H) 07/14/2020 09:31 AM   HGBA1C 7.1 (H) 03/03/2020 08:41 AM   GFR 62.96 07/23/2020 02:23 PM   GFR 77.58 07/14/2020 09:31 AM   MICROALBUR 51.6 (H) 07/14/2020 09:30 AM   MICROALBUR 1.6 12/10/2016 12:07 PM    Last diabetic Eye exam: No results found for: HMDIABEYEEXA  Last diabetic Foot exam: No results found for: HMDIABFOOTEX   Lab Results  Component Value Date   CHOL 147 07/14/2020   HDL 40.90 07/14/2020   LDLCALC 75 07/14/2020   LDLDIRECT 66.0 11/22/2018   TRIG 156.0 (H) 07/14/2020   CHOLHDL 4 07/14/2020    Hepatic Function Latest Ref Rng & Units 07/14/2020 03/03/2020 07/06/2019  Total Protein 6.0 - 8.3 g/dL 6.5 6.6 -  Albumin 3.5 - 5.2 g/dL 4.1 4.2 -  AST 0 - 37 U/L 16 15 -  ALT 0 - 35 U/L 20 20  14   Alk Phosphatase 39 - 117 U/L 103 90 -  Total Bilirubin 0.2 - 1.2 mg/dL 0.4 0.4 -    Lab Results  Component Value Date/Time   TSH 1.87 11/22/2018 10:07 AM   TSH 2.61 01/16/2018 02:38 PM    CBC Latest Ref Rng & Units 03/08/2018 02/22/2018 12/30/2014  WBC 4.0 - 10.5 K/uL 8.2 12.8(H) 8.1  Hemoglobin 12.0 - 15.0 g/dL 13.1 13.5 13.8  Hematocrit 36.0 - 46.0 % 39.0 40.2 41.1  Platelets 150.0 - 400.0 K/uL 328.0 302.0 283.0    Lab Results  Component Value Date/Time   VD25OH 53.26 03/03/2020 08:41 AM   VD25OH 83 08/09/2013 12:02 PM    Clinical ASCVD: No   Depression screen [facility] 2/9 07/28/2020 07/26/2019 01/16/2018  Decreased Interest 0 0 0  Down, Depressed, Hopeless 0 0 0  PHQ - 2 Score 0 0 0  Altered sleeping - - 0  Tired, decreased energy - - 0  Change in appetite - - 0  Feeling bad or failure about yourself  - - 0  Trouble concentrating - - 0  Moving slowly or fidgety/restless - - 0  Suicidal thoughts - - 0  PHQ-9 Score - - 0  Difficult doing work/chores - - Not difficult at all      Social History   Tobacco Use  Smoking Status  Never Smoker  Smokeless Tobacco Never Used   BP Readings from Last 3 Encounters:  07/23/20 (!) 152/84  03/05/20 138/80  01/03/20 124/86   Pulse Readings from Last 3 Encounters:  07/23/20 89  03/05/20 84  01/03/20 88   Wt Readings from Last 3 Encounters:  07/28/20 193 lb (87.5 kg)  07/10/20 193 lb (87.5 kg)  03/05/20 193 lb 12.8 oz (87.9 kg)    Assessment/Interventions: Review of patient past medical history, allergies, medications, health status, including review of consultants reports, laboratory and other test data, was performed as part of comprehensive evaluation and provision of chronic care management services.   SDOH:  (Social Determinants of Health) assessments and interventions performed: Yes SDOH Interventions   Flowsheet Row Most Recent Value  SDOH Interventions   Financial Strain Interventions Intervention Not Indicated       CCM Care Plan  Allergies  Allergen Reactions  . Citalopram Other (See Comments)    Sick feeling and abdominal pain  . Codeine Nausea Only    hallucinations    Medications Reviewed Today    Reviewed by De Hollingshead, RPH-CPP (Pharmacist) on 08/13/20 at 1554  Med List Status: <None>  Medication Order Taking? Sig Documenting Provider Last Dose Status Informant  glipiZIDE (GLUCOTROL XL) 2.5 MG 24 hr tablet [id number] Yes Take 2.5 mg by mouth daily with breakfast. [provider] Taking Active   glucose blood (ONETOUCH VERIO) test strip [id number] Yes USE AS DIRECTED Once per   Archie Balboa, MD Taking Active   hydrochlorothiazide (HYDRODIURIL) 25 MG tablet [id number] Yes TAKE 1 TABLET (25 MG TOTAL) BY MOUTH DAILY. Crecencio Mc, MD Taking Active   OneTouch Delica Lancets 10G [place] [id number] Yes USE AS DIRECTED EVERY DAY Crecencio Mc, MD Taking Active   rosuvastatin (CRESTOR) 5 MG tablet [id number] Yes TAKE 1 TABLET BY MOUTH EVERY DAY Crecencio Mc, MD Taking Active   Semaglutide,0.25 or 0.5MG/DOS, (OZEMPIC, 0.25 OR 0.5 MG/DOSE,) 2 MG/1.5ML SOPN [id number] Yes Inject 0.25 mg into the skin once a week. Crecencio Mc, MD Taking Active   telmisartan (MICARDIS) 40 MG tablet [id number] Yes Take 1 tablet (40 mg total) by mouth at bedtime. Crecencio Mc, MD Taking Active           Patient Active Problem List   Diagnosis Date Noted  . Diabetes mellitus with proteinuric diabetic nephropathy ([place]) 07/15/2020  . Peripheral vascular disease, asymptomatic ([place]) 05/07/2020  . Breast lump or mass 01/03/2020  . Grief reaction 01/03/2020  . Morbid obesity ([place]) 04/23/2019  . Obesity, diabetes, and hypertension syndrome ([place]) 11/23/2018  . Nocturnal leg cramps 11/07/2018  . Personal history of kidney stones 03/11/2018  . Encounter for preventive health examination 01/17/2018  . GAD (generalized anxiety disorder) 12/31/2014  . Menopause syndrome 08/11/2013  . S/P TAH-BSO  (total abdominal hysterectomy and bilateral salpingo-oophorectomy) 08/11/2013  . OSA (obstructive sleep apnea)   . Hyperlipidemia 06/01/2011  . Hypertension 06/01/2011    Immunization History  Administered Date(s) Administered  . Janssen (J&J) SARS-COV-2 Vaccination 11/29/2019  . PFIZER(Purple Top)SARS-COV-2 Vaccination 06/11/2020  . Zoster Recombinat (Shingrix) 07/26/2018, 12/15/2018    Conditions to be addressed/monitored:  Hypertension, Hyperlipidemia and Diabetes  Care Plan : Medication Management  Updates made by De Hollingshead, RPH-CPP since 08/13/2020 12:00 AM    Problem: DM, Obesity, HLD, HTN     Long-Range Goal: Disease Progression Prevention   Start Date: 07/17/2020  This Visit's Progress: On track  Recent Progress: On track  Priority: High  Note:   Current Barriers:  . Unable to achieve control of diabetes, weight   Pharmacist Clinical Goal(s):  Marland Kitchen Over the next 90 days, patient will achieve control of diabetes as evidenced by A1c  through collaboration with PharmD and provider.   Interventions: . 1:1 collaboration with Crecencio Mc, MD regarding development and update of comprehensive plan of care as evidenced by provider attestation and co-signature . Inter-disciplinary care team collaboration (see longitudinal plan of care) . Comprehensive medication review performed; medication list updated in electronic medical record  Health Maintenance: . Up to date on COVID vaccinations . Due for foot exam, eye exam. Patient notes that she will call to schedule eye exam today. Encouraged to mention foot exam to Dr. Derrel Nip at upcoming visit.   Diabetes: . Uncontrolled but improved; current treatment: Ozempic 0.25 mg weekly x 3 weeks, glipizide XL 2.5 mg daily  o Hx metformin: nausea, diarrhea  . Denies any concerns with stomach upset, nausea, vomiting, constipation since start Ozempic. Denies impact on appetite suppression, but acknowledges this will likely improve  with next dose. . Current glucose readings: has not been checking regularly lately, but notes her last reading 2 hours after supper was 129 . Educated on goal A1c, goal fasting, and goal 2 hour post prandial glucose . Discussed discontinuing glipizide when she increases Ozempic to 0.5 mg. Patient declines, would like to follow up with Dr. Derrel Nip next week first.  . Continue current regimen at this time. Follow up with Dr. Derrel Nip next week as scheduled.  Hypertension: . Controlled; current treatment: telmisartan 40 mg daily, HCTZ 25 mg daily . Current home readings: reports home readings this week are <130/80. Plans to bring BP monitor to the office when she sees Dr. Derrel Nip next week.  . Asks if she can discontinue HCTZ, as her pressures have improved. She would like to be on less medication. Discussed that HCTZ will likely be a chronic medication, but will follow for success of weight loss goals. Weight loss may allow for reduction in antihypertensive regimen.  . Patient notes that it is difficult for her to remember to take telmisartan in the evening, as all other medications in the morning. Encouraged her to discuss w/ Dr. Derrel Nip moving forward. Benefit of avoiding nonadherence may outweigh benefit of taking RAAS agent at bedtime. Could also consider consolidating telmisartan + HCTZ into combo pill to reduce bill burden.  . If BP not at goal at next appt, consider changing HCTZ to chlorthalidone for greater potency.   Hyperlipidemia: . Controlled per last lipid panel; current treatment: rosuvastatin 5 mg daily   . Recommended to continue current regimen at this time  Patient Goals/Self-Care Activities . Over the next 90 days, patient will:  - take medications as prescribed check glucose daily, document, and provide at future appointments check blood pressure periodically, document, and provide at future appointments  Follow Up Plan: Telephone follow up appointment with care management team  member scheduled for: ~ 6 weeks       Medication Assistance: None required.  Patient affirms current coverage meets needs.  Patient's preferred pharmacy is:  [id number] [place] [address] [id number]: [phone]: [phone]  Care Plan and Follow Up Patient Decision:  Patient agrees to Care Plan and Follow-up.  Plan: Telephone follow up appointment with care management team member scheduled for:  ~ 6 weeks  Catie TDarnelle Maffucci PharmD, [place] CPP Clinical  Probation officer at [place]

## 2020-08-15 ENCOUNTER — Telehealth: Payer: Self-pay | Admitting: Internal Medicine

## 2020-08-15 NOTE — Telephone Encounter (Signed)
Patient was returning your call she stated that she did not know how to get in touch with you

## 2020-08-15 NOTE — Telephone Encounter (Signed)
Returned call. Patient asked about whether the next dose of Ozempic (0.5 mg) would be on the same pen, as her husband picked up refills. Reviewed that the pen strength she picked up would deliver either 0.25 or 0.5 mg

## 2020-08-17 DIAGNOSIS — I10 Essential (primary) hypertension: Secondary | ICD-10-CM | POA: Diagnosis not present

## 2020-08-17 DIAGNOSIS — G4733 Obstructive sleep apnea (adult) (pediatric): Secondary | ICD-10-CM | POA: Diagnosis not present

## 2020-08-22 ENCOUNTER — Ambulatory Visit (INDEPENDENT_AMBULATORY_CARE_PROVIDER_SITE_OTHER): Payer: Medicare HMO | Admitting: Internal Medicine

## 2020-08-22 ENCOUNTER — Encounter: Payer: Self-pay | Admitting: Internal Medicine

## 2020-08-22 ENCOUNTER — Other Ambulatory Visit: Payer: Self-pay

## 2020-08-22 VITALS — BP 152/88 | HR 95 | Temp 98.2°F | Resp 15 | Ht 61.0 in | Wt 195.6 lb

## 2020-08-22 DIAGNOSIS — I10 Essential (primary) hypertension: Secondary | ICD-10-CM | POA: Diagnosis not present

## 2020-08-22 DIAGNOSIS — E1169 Type 2 diabetes mellitus with other specified complication: Secondary | ICD-10-CM | POA: Diagnosis not present

## 2020-08-22 DIAGNOSIS — E785 Hyperlipidemia, unspecified: Secondary | ICD-10-CM | POA: Diagnosis not present

## 2020-08-22 MED ORDER — OZEMPIC (0.25 OR 0.5 MG/DOSE) 2 MG/1.5ML ~~LOC~~ SOPN: 0.5000 mg | PEN_INJECTOR | SUBCUTANEOUS | 0 refills | Status: DC

## 2020-08-22 NOTE — Patient Instructions (Addendum)
Stop the glipizide   Increase the Ozempic dose to 0.5 mg AFTER your next  dose .    Repeat labs today just for liver enzymes   a1c due after April 24

## 2020-08-22 NOTE — Progress Notes (Signed)
Subjective:  Patient ID: Penny Butler, female    DOB: 12-27-1950  Age: 70 y.o. MRN: [id number]  CC: The primary encounter diagnosis was Hyperlipidemia associated with type 2 diabetes mellitus (HCC). Diagnoses of Morbid obesity (HCC) and Primary hypertension were also pertinent to this visit.  HPI Penny Butler presents for follow up on type 2 DM and hypertension  This visit occurred during the SARS-CoV-2 public health emergency.  Safety protocols were in place, including screening questions prior to the visit, additional usage of staff PPE, and extensive cleaning of exam room while observing appropriate contact time as indicated for disinfecting solutions.   Has been meeting with Catie Feliz Beam, PharmD  and has started Ozempic  At the starting dose of  0.25 mg  Weekly   3 weeks ago..  Still taking glipizide as well,  and readings have been 102 post prandially .  She has not had any lows and is tolerating the medication without side effects.  Did not tolerate metformin       Outpatient Medications Prior to Visit  Medication Sig Dispense Refill  . glucose blood (ONETOUCH VERIO) test strip USE AS DIRECTED Once per   DAY 100 strip 5  . hydrochlorothiazide (HYDRODIURIL) 25 MG tablet TAKE 1 TABLET (25 MG TOTAL) BY MOUTH DAILY. 90 tablet 0  . OneTouch Delica Lancets 30G MISC USE AS DIRECTED EVERY DAY 100 each 5  . rosuvastatin (CRESTOR) 5 MG tablet TAKE 1 TABLET BY MOUTH EVERY DAY 30 tablet 5  . telmisartan (MICARDIS) 40 MG tablet Take 1 tablet (40 mg total) by mouth at bedtime. 90 tablet 1  . Semaglutide,0.25 or 0.5MG /DOS, (OZEMPIC, 0.25 OR 0.5 MG/DOSE,) 2 MG/1.5ML SOPN Inject 0.25 mg into the skin once a week. 1.5 mL 3  . glipiZIDE (GLUCOTROL XL) 2.5 MG 24 hr tablet Take 2.5 mg by mouth daily with breakfast. (Patient not taking: Reported on 08/22/2020)     No facility-administered medications prior to visit.    Review of Systems;  Patient denies headache, fevers, malaise,  unintentional weight loss, skin rash, eye pain, sinus congestion and sinus pain, sore throat, dysphagia,  hemoptysis , cough, dyspnea, wheezing, chest pain, palpitations, orthopnea, edema, abdominal pain, nausea, melena, diarrhea, constipation, flank pain, dysuria, hematuria, urinary  Frequency, nocturia, numbness, tingling, seizures,  Focal weakness, Loss of consciousness,  Tremor, insomnia, depression, anxiety, and suicidal ideation.      Objective:  BP (!) 152/88 (BP Location: Left Arm, Patient Position: Sitting, Cuff Size: Normal)   Pulse 95   Temp 98.2 F (36.8 C) (Oral)   Resp 15   Ht 5\' 1"  (1.549 m)   Wt 195 lb 9.6 oz (88.7 kg)   SpO2 99%   BMI 36.96 kg/m   BP Readings from Last 3 Encounters:  08/22/20 (!) 152/88  07/23/20 (!) 152/84  03/05/20 138/80    Wt Readings from Last 3 Encounters:  08/22/20 195 lb 9.6 oz (88.7 kg)  07/28/20 193 lb (87.5 kg)  07/10/20 193 lb (87.5 kg)    General appearance: alert, cooperative and appears stated age Ears: normal TM's and external ear canals both ears Throat: lips, mucosa, and tongue normal; teeth and gums normal Neck: no adenopathy, no carotid bruit, supple, symmetrical, trachea midline and thyroid not enlarged, symmetric, no tenderness/mass/nodules Back: symmetric, no curvature. ROM normal. No CVA tenderness. Lungs: clear to auscultation bilaterally Heart: regular rate and rhythm, S1, S2 normal, no murmur, click, rub or gallop Abdomen: soft, non-tender; bowel sounds normal; no masses,  no organomegaly Pulses: 2+ and symmetric Skin: Skin color, texture, turgor normal. No rashes or lesions Lymph nodes: Cervical, supraclavicular, and axillary nodes normal.  Lab Results  Component Value Date   HGBA1C 7.7 (H) 07/14/2020   HGBA1C 7.1 (H) 03/03/2020   HGBA1C 5.9 04/10/2019    Lab Results  Component Value Date   CREATININE 0.92 08/22/2020   CREATININE 0.94 07/23/2020   CREATININE 0.79 07/14/2020    Lab Results  Component  Value Date   WBC 8.2 03/08/2018   HGB 13.1 03/08/2018   HCT 39.0 03/08/2018   PLT 328.0 03/08/2018   GLUCOSE 128 (H) 08/22/2020   CHOL 147 07/14/2020   TRIG 156.0 (H) 07/14/2020   HDL 40.90 07/14/2020   LDLDIRECT 66.0 11/22/2018   LDLCALC 75 07/14/2020   ALT 23 08/22/2020   AST 19 08/22/2020   NA 143 08/22/2020   K 3.6 08/22/2020   CL 106 08/22/2020   CREATININE 0.92 08/22/2020   BUN 20 08/22/2020   CO2 28 08/22/2020   TSH 1.87 11/22/2018   HGBA1C 7.7 (H) 07/14/2020   MICROALBUR 51.6 (H) 07/14/2020     Assessment & Plan:   Problem List Items Addressed This Visit      Unprioritized   Hypertension    she reports compliance with medication regimen  but has an elevated reading today in office.  She is not using NSAIDs daily.  Discussed goal of 120/70  (130/80 for patients over 70)  to preserve renal function.  She has been asked to check her  BP  at home and  submit readings for evaluation.      Morbid obesity (HCC)    Complicated by type 2 DM Reviewed her previous success at weight loss,  Her goal weight for BMI < 30 and her current activity level I have addressed  BMI and recommended wt loss of 10% of body weigh over the next 6 months using a low glycemic index diet and regular exercise a minimum of 5 days per week.        Relevant Medications   Semaglutide,0.25 or 0.5MG /DOS, (OZEMPIC, 0.25 OR 0.5 MG/DOSE,) 2 MG/1.5ML SOPN    Other Visit Diagnoses    Hyperlipidemia associated with type 2 diabetes mellitus (HCC)    -  Primary   Relevant Medications   Semaglutide,0.25 or 0.5MG /DOS, (OZEMPIC, 0.25 OR 0.5 MG/DOSE,) 2 MG/1.5ML SOPN   Other Relevant Orders   Comprehensive metabolic panel (Completed)      I have discontinued Vikki Ports I. Sosnowski's glipiZIDE. I have also changed her Ozempic (0.25 or 0.5 MG/DOSE). Additionally, I am having her maintain her OneTouch Delica Lancets 30G, OneTouch Verio, telmisartan, rosuvastatin, and hydrochlorothiazide.  Meds ordered this  encounter  Medications  . Semaglutide,0.25 or 0.5MG /DOS, (OZEMPIC, 0.25 OR 0.5 MG/DOSE,) 2 MG/1.5ML SOPN    Sig: Inject 0.5 mg into the skin once a week.    Dispense:  1.5 mL    Refill:  0    Medications Discontinued During This Encounter  Medication Reason  . Semaglutide,0.25 or 0.5MG /DOS, (OZEMPIC, 0.25 OR 0.5 MG/DOSE,) 2 MG/1.5ML SOPN   . glipiZIDE (GLUCOTROL XL) 2.5 MG 24 hr tablet     Follow-up: No follow-ups on file.   Sherlene Shams, MD

## 2020-08-23 LAB — COMPREHENSIVE METABOLIC PANEL
AG Ratio: 1.6 (calc) (ref 1.0–2.5)
ALT: 23 U/L (ref 6–29)
AST: 19 U/L (ref 10–35)
Albumin: 4.1 g/dL (ref 3.6–5.1)
Alkaline phosphatase (APISO): 89 U/L (ref 37–153)
BUN: 20 mg/dL (ref 7–25)
CO2: 28 mmol/L (ref 20–32)
Calcium: 10.4 mg/dL (ref 8.6–10.4)
Chloride: 106 mmol/L (ref 98–110)
Creat: 0.92 mg/dL (ref 0.50–0.99)
Globulin: 2.5 g/dL (calc) (ref 1.9–3.7)
Glucose, Bld: 128 mg/dL — ABNORMAL HIGH (ref 65–99)
Potassium: 3.6 mmol/L (ref 3.5–5.3)
Sodium: 143 mmol/L (ref 135–146)
Total Bilirubin: 0.3 mg/dL (ref 0.2–1.2)
Total Protein: 6.6 g/dL (ref 6.1–8.1)

## 2020-08-24 NOTE — Assessment & Plan Note (Signed)
she reports compliance with medication regimen  but has an elevated reading today in office.  She is not using NSAIDs daily.  Discussed goal of 120/70  (130/80 for patients over 70)  to preserve renal function.  She has been asked to check her  BP  at home and  submit readings for evaluation.  

## 2020-08-24 NOTE — Assessment & Plan Note (Signed)
Complicated by type 2 DM Reviewed her previous success at weight loss,  Her goal weight for BMI < 30 and her current activity level I have addressed  BMI and recommended wt loss of 10% of body weigh over the next 6 months using a low glycemic index diet and regular exercise a minimum of 5 days per week.

## 2020-08-25 ENCOUNTER — Ambulatory Visit: Payer: BC Managed Care – PPO

## 2020-08-26 ENCOUNTER — Ambulatory Visit: Payer: BC Managed Care – PPO

## 2020-09-01 ENCOUNTER — Telehealth: Payer: Medicare HMO

## 2020-09-03 ENCOUNTER — Telehealth: Payer: Self-pay

## 2020-09-03 MED ORDER — TELMISARTAN 40 MG PO TABS: 40.0000 mg | ORAL_TABLET | Freq: Every day | ORAL | 1 refills | Status: DC

## 2020-09-03 NOTE — Telephone Encounter (Signed)
Pt is about out of telmisartan (MICARDIS) 40 MG tablet. [facility] send to [facility] [place]

## 2020-09-14 DIAGNOSIS — I10 Essential (primary) hypertension: Secondary | ICD-10-CM | POA: Diagnosis not present

## 2020-09-14 DIAGNOSIS — G4733 Obstructive sleep apnea (adult) (pediatric): Secondary | ICD-10-CM | POA: Diagnosis not present

## 2020-09-16 ENCOUNTER — Telehealth: Payer: Self-pay | Admitting: Internal Medicine

## 2020-09-16 ENCOUNTER — Telehealth: Payer: Self-pay

## 2020-09-16 ENCOUNTER — Ambulatory Visit: Payer: Medicare HMO | Admitting: Pharmacist

## 2020-09-16 DIAGNOSIS — E1169 Type 2 diabetes mellitus with other specified complication: Secondary | ICD-10-CM

## 2020-09-16 DIAGNOSIS — E785 Hyperlipidemia, unspecified: Secondary | ICD-10-CM

## 2020-09-16 DIAGNOSIS — E1159 Type 2 diabetes mellitus with other circulatory complications: Secondary | ICD-10-CM

## 2020-09-16 DIAGNOSIS — E1121 Type 2 diabetes mellitus with diabetic nephropathy: Secondary | ICD-10-CM

## 2020-09-16 DIAGNOSIS — I152 Hypertension secondary to endocrine disorders: Secondary | ICD-10-CM

## 2020-09-16 NOTE — Telephone Encounter (Signed)
Returned called, see CCM documentation

## 2020-09-16 NOTE — Telephone Encounter (Signed)
Pt would like to speak with you about Semaglutide,0.25 or 0.5MG /DOS, (OZEMPIC, 0.25 OR 0.5 MG/DOSE,) 2 MG/1.5ML SOPN. She states that it is very important. Please advise

## 2020-09-16 NOTE — Chronic Care Management (AMB) (Signed)
Chronic Care Management Pharmacy Note  09/16/2020 Name:  Penny Butler MRN:  [id number] DOB:  Aug 05, 1950  Subjective: Penny Butler is an 70 y.o. year old female who is a primary patient of Derrel Nip, Aris Everts, MD.  The CCM team was consulted for assistance with disease management and care coordination needs.    Engaged with patient by telephone for reponse to question about medication access in response to provider referral for pharmacy case management and/or care coordination services.   Consent to Services:  The patient was given information about Chronic Care Management services, agreed to services, and gave verbal consent prior to initiation of services.  Please see initial visit note for detailed documentation.   Patient Care Team: Crecencio Mc, MD as PCP - General (Internal Medicine) De Hollingshead, RPH-CPP (Pharmacist)  Recent office visits:  PCP visit 3/4 - Ozempic increased, encouraged to check BP at home  Recent consult visits: None since our last call  Hospital visits: None in previous 6 months  Objective:  Lab Results  Component Value Date   CREATININE 0.92 08/22/2020   CREATININE 0.94 07/23/2020   CREATININE 0.79 07/14/2020    Lab Results  Component Value Date   HGBA1C 7.7 (H) 07/14/2020   Last diabetic Eye exam:  Lab Results  Component Value Date/Time   HMDIABEYEEXA No Retinopathy 06/12/2019 12:00 AM    Last diabetic Foot exam: No results found for: HMDIABFOOTEX      Component Value Date/Time   CHOL 147 07/14/2020 0931   CHOL 214 (H) 07/06/2019 1201   TRIG 156.0 (H) 07/14/2020 0931   HDL 40.90 07/14/2020 0931   HDL 40 07/06/2019 1201   CHOLHDL 4 07/14/2020 0931   VLDL 31.2 07/14/2020 0931   LDLCALC 75 07/14/2020 0931   LDLCALC 130 (H) 07/06/2019 1201   LDLDIRECT 66.0 11/22/2018 1007    Hepatic Function Latest Ref Rng & Units 08/22/2020 07/14/2020 03/03/2020  Total Protein 6.1 - 8.1 g/dL 6.6 6.5 6.6  Albumin 3.5 - 5.2 g/dL - 4.1  4.2  AST 10 - 35 U/L _0 ALT 6 - 29 U/L _1 Alk Phosphatase 39 - 117 U/L - 103 90  Total Bilirubin 0.2 - 1.2 mg/dL 0.3 0.4 0.4    Lab Results  Component Value Date/Time   TSH 1.87 11/22/2018 10:07 AM   TSH 2.61 01/16/2018 02:38 PM    CBC Latest Ref Rng & Units 03/08/2018 02/22/2018 12/30/2014  WBC 4.0 - 10.5 K/uL 8.2 12.8(H) 8.1  Hemoglobin 12.0 - 15.0 g/dL 13.1 13.5 13.8  Hematocrit 36.0 - 46.0 % 39.0 40.2 41.1  Platelets 150.0 - 400.0 K/uL 328.0 302.0 283.0    Lab Results  Component Value Date/Time   VD25OH 53.26 03/03/2020 08:41 AM   VD25OH 83 08/09/2013 12:02 PM    Clinical ASCVD: No  The 10-year ASCVD risk score Mikey Bussing DC Jr., et al., 2013) is: 25.4%   Values used to calculate the score:     Age: 22 years     Sex: Female     Is Non-Hispanic African American: Yes     Diabetic: Yes     Tobacco smoker: No     Systolic Blood Pressure: 814 mmHg     Is BP treated: Yes     HDL Cholesterol: 40.9 mg/dL     Total Cholesterol: 147 mg/dL      Social History   Tobacco Use  Smoking Status Never Smoker  Smokeless Tobacco Never  Used   BP Readings from Last 3 Encounters:  08/22/20 (!) 152/88  07/23/20 (!) 152/84  03/05/20 138/80   Pulse Readings from Last 3 Encounters:  08/22/20 95  07/23/20 89  03/05/20 84   Wt Readings from Last 3 Encounters:  08/22/20 195 lb 9.6 oz (88.7 kg)  07/28/20 193 lb (87.5 kg)  07/10/20 193 lb (87.5 kg)    Assessment: Review of patient past medical history, allergies, medications, health status, including review of consultants reports, laboratory and other test data, was performed as part of comprehensive evaluation and provision of chronic care management services.   SDOH:  (Social Determinants of Health) assessments and interventions performed: none today    CCM Care Plan  Allergies  Allergen Reactions  . Citalopram Other (See Comments)    Sick feeling and abdominal pain  . Codeine Nausea Only    hallucinations     Medications Reviewed Today    Reviewed by Adair Laundry, CMA (Certified Medical Assistant) on 08/22/20 at 23  Med List Status: <None>  Medication Order Taking? Sig Documenting Provider Last Dose Status Informant  glipiZIDE (GLUCOTROL XL) 2.5 MG 24 hr tablet [id number] No Take 2.5 mg by mouth daily with breakfast.  Patient not taking: Reported on 08/22/2020   [provider] Not Taking Active   glucose blood (ONETOUCH VERIO) test strip [id number] Yes USE AS DIRECTED Once per   Archie Balboa, MD Taking Active   hydrochlorothiazide (HYDRODIURIL) 25 MG tablet [id number] Yes TAKE 1 TABLET (25 MG TOTAL) BY MOUTH DAILY. Crecencio Mc, MD Taking Active   OneTouch Delica Lancets 44I [place] [id number] Yes USE AS DIRECTED EVERY DAY Crecencio Mc, MD Taking Active   rosuvastatin (CRESTOR) 5 MG tablet [id number] Yes TAKE 1 TABLET BY MOUTH EVERY DAY Crecencio Mc, MD Taking Active   Semaglutide,0.25 or 0.5MG/DOS, (OZEMPIC, 0.25 OR 0.5 MG/DOSE,) 2 MG/1.5ML SOPN [id number] Yes Inject 0.25 mg into the skin once a week. Crecencio Mc, MD Taking Active   telmisartan (MICARDIS) 40 MG tablet [id number] Yes Take 1 tablet (40 mg total) by mouth at bedtime. Crecencio Mc, MD Taking Active           Patient Active Problem List   Diagnosis Date Noted  . Diabetes mellitus with proteinuric diabetic nephropathy ([place]) 07/15/2020  . Peripheral vascular disease, asymptomatic ([place]) 05/07/2020  . Breast lump or mass 01/03/2020  . Grief reaction 01/03/2020  . Morbid obesity ([place]) 04/23/2019  . Obesity, diabetes, and hypertension syndrome ([place]) 11/23/2018  . Nocturnal leg cramps 11/07/2018  . Personal history of kidney stones 03/11/2018  . Encounter for preventive health examination 01/17/2018  . GAD (generalized anxiety disorder) 12/31/2014  . Menopause syndrome 08/11/2013  . S/P TAH-BSO (total abdominal hysterectomy and bilateral salpingo-oophorectomy) 08/11/2013  . OSA (obstructive sleep  apnea)   . Hyperlipidemia 06/01/2011  . Hypertension 06/01/2011    Immunization History  Administered Date(s) Administered  . Janssen (J&J) SARS-COV-2 Vaccination 11/29/2019  . PFIZER(Purple Top)SARS-COV-2 Vaccination 06/11/2020  . Zoster Recombinat (Shingrix) 07/26/2018, 12/15/2018    Conditions to be addressed/monitored: HTN, HLD and DMII  Care Plan : Medication Management  Updates made by De Hollingshead, RPH-CPP since 09/16/2020 12:00 AM    Problem: DM, Obesity, HLD, HTN     Long-Range Goal: Disease Progression Prevention   Start Date: 07/17/2020  This Visit's Progress: On track  Recent Progress: On track  Priority: High  Note:   Current Barriers:  . Unable to achieve control of  diabetes, weight   Pharmacist Clinical Goal(s):  Marland Kitchen Over the next 90 days, patient will achieve control of diabetes as evidenced by A1c  through collaboration with PharmD and provider.   Interventions: . 1:1 collaboration with Crecencio Mc, MD regarding development and update of comprehensive plan of care as evidenced by provider attestation and co-signature . Inter-disciplinary care team collaboration (see longitudinal plan of care) . Comprehensive medication review performed; medication list updated in electronic medical record  Health Maintenance: . Up to date on COVID vaccinations . Due for foot exam, eye exam. Patient notes that she will call to schedule eye exam today. Encouraged to mention foot exam to Dr. Derrel Nip at upcoming visit.   Diabetes: . Uncontrolled but improved; current treatment: Ozempic 0.5 mg weekly - notes she has been on this for about 4 weeks. Notes bloating and occasional GERD, constipation, and occasional nausea the days after the injections. She worries that these side effects will be constant.  o Hx metformin: nausea, diarrhea  . Discussed that GI side effects generally improve over time with GLP1s. Discussed reducing dose to 9 clicks above 6.57 mg (there are a  total of 18 clicks between 9.03 and 0.5 mg dose). She prefers to continue current dose of 0.5 mg for a little while longer.  . Discussed nonpharmacological methods of minimization of symptoms, including smaller portion sizes, focus on hydration and high fiber meals, avoidance of spicy or greasy foods and avoidance of laying down after meals.   Hypertension: . Generally controlled; current treatment: telmisartan 40 mg daily, HCTZ 25 mg daily . Current home readings: reports home readings this week are <130/80. Plans to bring BP monitor to the office when she sees Dr. Derrel Nip next week.  . Previously recommended to continue current regimen along with home BP checks.   Hyperlipidemia: . Controlled per last lipid panel; current treatment: rosuvastatin 5 mg daily   . Recommended to continue current regimen at this time  Patient Goals/Self-Care Activities . Over the next 90 days, patient will:  - take medications as prescribed check glucose daily, document, and provide at future appointments check blood pressure periodically, document, and provide at future appointments  Follow Up Plan: Telephone follow up appointment with care management team member scheduled for: ~ 5 weeks as previously scheduled      Medication Assistance: None required.  Patient affirms current coverage meets needs.  [id number] [place] [address] [id number]: [phone]: [phone] Follow Up:  Patient agrees to Care Plan and Follow-up.  Plan: Telephone follow up appointment with care management team member scheduled for:  ~ 5 weeks as previously scheduled  Catie TDarnelle Maffucci PharmD, BDu [place] CWaterflowClinical Pharmacist LOccidental [facility] B[employer] [phone]

## 2020-09-16 NOTE — Patient Instructions (Signed)
Visit Information  PATIENT GOALS: Goals Addressed              This Visit's Progress     Patient Stated   .  Medication Monitoring (pt-stated)        Patient Goals/Self-Care Activities . Over the next 90 days, patient will:  - take medications as prescribed check glucose daily, document, and provide at future appointments check blood pressure periodically, document, and provide at future appointments         Patient verbalizes understanding of instructions provided today and agrees to view in MyChart.   Plan: Telephone follow up appointment with care management team member scheduled for:  ~ 5 weeks as previously scheduled  Catie Feliz Beam, PharmD, [place], CPP Clinical Pharmacist [employer] [facility] [employer] [phone]

## 2020-09-16 NOTE — Telephone Encounter (Signed)
Eye exam abstraction

## 2020-10-03 ENCOUNTER — Telehealth: Payer: BC Managed Care – PPO

## 2020-10-06 ENCOUNTER — Ambulatory Visit: Payer: Medicare HMO | Admitting: Pharmacist

## 2020-10-06 DIAGNOSIS — E1165 Type 2 diabetes mellitus with hyperglycemia: Secondary | ICD-10-CM

## 2020-10-06 DIAGNOSIS — I10 Essential (primary) hypertension: Secondary | ICD-10-CM

## 2020-10-06 DIAGNOSIS — E7800 Pure hypercholesterolemia, unspecified: Secondary | ICD-10-CM

## 2020-10-06 NOTE — Chronic Care Management (AMB) (Signed)
Chronic Care Management Pharmacy Note  10/06/2020 Name:  Penny Butler MRN:  [id number] DOB:  06-29-1950  Subjective: Penny Butler is an 70 y.o. year old female who is a primary patient of Derrel Nip, Aris Everts, MD.  The CCM team was consulted for assistance with disease management and care coordination needs.    Engaged with patient by telephone for reponse to call regarding medication management in response to provider referral for pharmacy case management and/or care coordination services.   Consent to Services:  The patient was given information about Chronic Care Management services, agreed to services, and gave verbal consent prior to initiation of services.  Please see initial visit note for detailed documentation.   Patient Care Team: Crecencio Mc, MD as PCP - General (Internal Medicine) De Hollingshead, RPH-CPP (Pharmacist)  Recent office visits: None since our last call  Recent consult visits: None since our last call  Hospital visits: None in previous 6 months  Objective:  Lab Results  Component Value Date   CREATININE 0.92 08/22/2020   CREATININE 0.94 07/23/2020   CREATININE 0.79 07/14/2020    Lab Results  Component Value Date   HGBA1C 7.7 (H) 07/14/2020   Last diabetic Eye exam:  Lab Results  Component Value Date/Time   HMDIABEYEEXA No Retinopathy 06/12/2019 12:00 AM    Last diabetic Foot exam: No results found for: HMDIABFOOTEX      Component Value Date/Time   CHOL 147 07/14/2020 0931   CHOL 214 (H) 07/06/2019 1201   TRIG 156.0 (H) 07/14/2020 0931   HDL 40.90 07/14/2020 0931   HDL 40 07/06/2019 1201   CHOLHDL 4 07/14/2020 0931   VLDL 31.2 07/14/2020 0931   LDLCALC 75 07/14/2020 0931   LDLCALC 130 (H) 07/06/2019 1201   LDLDIRECT 66.0 11/22/2018 1007    Hepatic Function Latest Ref Rng & Units 08/22/2020 07/14/2020 03/03/2020  Total Protein 6.1 - 8.1 g/dL 6.6 6.5 6.6  Albumin 3.5 - 5.2 g/dL - 4.1 4.2  AST 10 - 35 U/L _0 ALT 6  - 29 U/L _1 Alk Phosphatase 39 - 117 U/L - 103 90  Total Bilirubin 0.2 - 1.2 mg/dL 0.3 0.4 0.4    Lab Results  Component Value Date/Time   TSH 1.87 11/22/2018 10:07 AM   TSH 2.61 01/16/2018 02:38 PM    CBC Latest Ref Rng & Units 03/08/2018 02/22/2018 12/30/2014  WBC 4.0 - 10.5 K/uL 8.2 12.8(H) 8.1  Hemoglobin 12.0 - 15.0 g/dL 13.1 13.5 13.8  Hematocrit 36.0 - 46.0 % 39.0 40.2 41.1  Platelets 150.0 - 400.0 K/uL 328.0 302.0 283.0    Lab Results  Component Value Date/Time   VD25OH 53.26 03/03/2020 08:41 AM   VD25OH 83 08/09/2013 12:02 PM    Clinical ASCVD: No  The 10-year ASCVD risk score Mikey Bussing DC Jr., et al., 2013) is: 25.4%   Values used to calculate the score:     Age: 41 years     Sex: Female     Is Non-Hispanic African American: Yes     Diabetic: Yes     Tobacco smoker: No     Systolic Blood Pressure: 937 mmHg     Is BP treated: Yes     HDL Cholesterol: 40.9 mg/dL     Total Cholesterol: 147 mg/dL     Social History   Tobacco Use  Smoking Status Never Smoker  Smokeless Tobacco Never Used   BP Readings from Last 3 Encounters:  08/22/20 (!) 152/88  07/23/20 (!) 152/84  03/05/20 138/80   Pulse Readings from Last 3 Encounters:  08/22/20 95  07/23/20 89  03/05/20 84   Wt Readings from Last 3 Encounters:  08/22/20 195 lb 9.6 oz (88.7 kg)  07/28/20 193 lb (87.5 kg)  07/10/20 193 lb (87.5 kg)    Assessment: Review of patient past medical history, allergies, medications, health status, including review of consultants reports, laboratory and other test data, was performed as part of comprehensive evaluation and provision of chronic care management services.   SDOH:  (Social Determinants of Health) assessments and interventions performed:    CCM Care Plan  Allergies  Allergen Reactions  . Citalopram Other (See Comments)    Sick feeling and abdominal pain  . Codeine Nausea Only    hallucinations    Medications Reviewed Today    Reviewed by Adair Laundry, CMA (Certified Medical Assistant) on 08/22/20 at 61  Med List Status: <None>  Medication Order Taking? Sig Documenting Provider Last Dose Status Informant  glipiZIDE (GLUCOTROL XL) 2.5 MG 24 hr tablet [id number] No Take 2.5 mg by mouth daily with breakfast.  Patient not taking: Reported on 08/22/2020   [provider] Not Taking Active   glucose blood (ONETOUCH VERIO) test strip [id number] Yes USE AS DIRECTED Once per   Archie Balboa, MD Taking Active   hydrochlorothiazide (HYDRODIURIL) 25 MG tablet [id number] Yes TAKE 1 TABLET (25 MG TOTAL) BY MOUTH DAILY. Crecencio Mc, MD Taking Active   OneTouch Delica Lancets [address] [place] [id number] Yes USE AS DIRECTED EVERY DAY Crecencio Mc, MD Taking Active   rosuvastatin (CRESTOR) 5 MG tablet [id number] Yes TAKE 1 TABLET BY MOUTH EVERY DAY Crecencio Mc, MD Taking Active   Semaglutide,0.25 or 0.5MG/DOS, (OZEMPIC, 0.25 OR 0.5 MG/DOSE,) 2 MG/1.5ML SOPN [id number] Yes Inject 0.25 mg into the skin once a week. Crecencio Mc, MD Taking Active   telmisartan (MICARDIS) 40 MG tablet [id number] Yes Take 1 tablet (40 mg total) by mouth at bedtime. Crecencio Mc, MD Taking Active           Patient Active Problem List   Diagnosis Date Noted  . Diabetes mellitus with proteinuric diabetic nephropathy ([place]) 07/15/2020  . Peripheral vascular disease, asymptomatic ([place]) 05/07/2020  . Breast lump or mass 01/03/2020  . Grief reaction 01/03/2020  . Morbid obesity ([place]) 04/23/2019  . Obesity, diabetes, and hypertension syndrome ([place]) 11/23/2018  . Nocturnal leg cramps 11/07/2018  . Personal history of kidney stones 03/11/2018  . Encounter for preventive health examination 01/17/2018  . GAD (generalized anxiety disorder) 12/31/2014  . Menopause syndrome 08/11/2013  . S/P TAH-BSO (total abdominal hysterectomy and bilateral salpingo-oophorectomy) 08/11/2013  . OSA (obstructive sleep apnea)   . Hyperlipidemia 06/01/2011  . Hypertension  06/01/2011    Immunization History  Administered Date(s) Administered  . Janssen (J&J) SARS-COV-2 Vaccination 11/29/2019  . PFIZER(Purple Top)SARS-COV-2 Vaccination 06/11/2020  . Zoster Recombinat (Shingrix) 07/26/2018, 12/15/2018    Conditions to be addressed/monitored: HTN, HLD and DMII  Care Plan : Medication Management  Updates made by De Hollingshead, RPH-CPP since 10/06/2020 12:00 AM    Problem: DM, Obesity, HLD, HTN     Long-Range Goal: Disease Progression Prevention   Start Date: 07/17/2020  This Visit's Progress: On track  Recent Progress: On track  Priority: High  Note:   Current Barriers:  . Unable to achieve control of diabetes, weight   Pharmacist Clinical Goal(s):  Marland Kitchen Over the next  90 days, patient will achieve control of diabetes as evidenced by A1c  through collaboration with PharmD and provider.   Interventions: . 1:1 collaboration with Crecencio Mc, MD regarding development and update of comprehensive plan of care as evidenced by provider attestation and co-signature . Inter-disciplinary care team collaboration (see longitudinal plan of care) . Comprehensive medication review performed; medication list updated in electronic medical record  Health Maintenance: . Up to date on COVID vaccinations . Due for foot exam, eye exam.   Diabetes: . Uncontrolled but improved; current treatment: Ozempic 0.5 mg weekly - notes significant and severe bloating after taking Ozempic on Saturday. Notes she almost had her husband drive her to the ED due to the bloating, stomach upset.  o Hx metformin: nausea, diarrhea  . Discontinue Ozempic. If symptoms persist after medication has washed out, will collaborate w/ PCP to work up for other causes.  Marland Kitchen Rescheduled f/u appointment for ~ 4 weeks to discuss next steps.   Hypertension: . Generally controlled; current treatment: telmisartan 40 mg daily, HCTZ 25 mg daily . Previously recommended to continue current regimen along  with home BP checks. Very concerned today about "HCTZ recall". Reviewed that this was a recall with combination olmesartan/HCTZ only.  . Also report "leg aches when I take my telmisartan at night", though later clarifies that she takes telmisartan every night. Difficult to ascertain on call today how long the discomfort has been going on for and any aggravating or remitting factors. Encouraged patient to continue current regimen at this time to avoid making multiple medication changes at once. Rescheduled f/u appointment for ~ 4 weeks to re-address her reports of leg discomfort and determine if pharmacotherapy adjustments are needed.   Hyperlipidemia: . Controlled per last lipid panel; current treatment: rosuvastatin 5 mg daily   . Recommended to continue current regimen at this time  Patient Goals/Self-Care Activities . Over the next 90 days, patient will:  - take medications as prescribed check glucose daily, document, and provide at future appointments check blood pressure periodically, document, and provide at future appointments  Follow Up Plan: Telephone follow up appointment with care management team member scheduled for: ~ 4 weeks      Medication Assistance: None required.  Patient affirms current coverage meets needs.  [id number] [place] [address] [place]Phone: [phone]: [phone] Follow Up:  Patient agrees to Care Plan and Follow-up.  Plan: Telephone follow up appointment with care management team member scheduled for:  ~ 4 weeks  Catie TDarnelle Maffucci PharmD, B[place] CRichmondClinical Pharmacist LOccidental [facility] [phone]

## 2020-10-06 NOTE — Patient Instructions (Signed)
Visit Information  PATIENT GOALS: Goals Addressed              This Visit's Progress     Patient Stated   .  Medication Monitoring (pt-stated)        Patient Goals/Self-Care Activities . Over the next 90 days, patient will:  - take medications as prescribed check glucose daily, document, and provide at future appointments check blood pressure periodically, document, and provide at future appointments       The patient verbalized understanding of instructions, educational materials, and care plan provided today and declined offer to receive copy of patient instructions, educational materials, and care plan.   Plan: Telephone follow up appointment with care management team member scheduled for:  ~ 4 weeks  Catie Feliz Beam, PharmD, [place], CPP Clinical Pharmacist [employer] [facility] [employer] [phone]

## 2020-10-10 ENCOUNTER — Telehealth: Payer: BC Managed Care – PPO

## 2020-10-15 DIAGNOSIS — G4733 Obstructive sleep apnea (adult) (pediatric): Secondary | ICD-10-CM | POA: Diagnosis not present

## 2020-10-15 DIAGNOSIS — I10 Essential (primary) hypertension: Secondary | ICD-10-CM | POA: Diagnosis not present

## 2020-10-21 ENCOUNTER — Ambulatory Visit (INDEPENDENT_AMBULATORY_CARE_PROVIDER_SITE_OTHER): Payer: Medicare HMO | Admitting: Pharmacist

## 2020-10-21 DIAGNOSIS — E1165 Type 2 diabetes mellitus with hyperglycemia: Secondary | ICD-10-CM

## 2020-10-21 DIAGNOSIS — E785 Hyperlipidemia, unspecified: Secondary | ICD-10-CM

## 2020-10-21 DIAGNOSIS — E1169 Type 2 diabetes mellitus with other specified complication: Secondary | ICD-10-CM

## 2020-10-21 DIAGNOSIS — I10 Essential (primary) hypertension: Secondary | ICD-10-CM

## 2020-10-21 MED ORDER — TRULICITY 0.75 MG/0.5ML ~~LOC~~ SOAJ: 0.7500 mg | SUBCUTANEOUS | 2 refills | Status: DC

## 2020-10-21 NOTE — Telephone Encounter (Signed)
I called patient back. See CCM documentation. Trialing Trulicity.

## 2020-10-21 NOTE — Chronic Care Management (AMB) (Signed)
Chronic Care Management Pharmacy Note  10/21/2020 Name:  Penny Butler MRN:  [id number] DOB:  [birth year]  Subjective: Penny Butler is an 70 y.o. year old female who is a primary patient of Derrel Nip, Aris Everts, MD.  The CCM team was consulted for assistance with disease management and care coordination needs.    Engaged with patient by telephone for response to call with medication management questions in response to provider referral for pharmacy case management and/or care coordination services.   Consent to Services:  The patient was given information about Chronic Care Management services, agreed to services, and gave verbal consent prior to initiation of services.  Please see initial visit note for detailed documentation.   Patient Care Team: Crecencio Mc, MD as PCP - General (Internal Medicine) De Hollingshead, RPH-CPP (Pharmacist)  Recent office visits: None since our last call  Recent consult visits: None since our last call  Hospital visits: None in previous 6 months  Objective:  Lab Results  Component Value Date   CREATININE 0.92 08/22/2020   CREATININE 0.94 07/23/2020   CREATININE 0.79 07/14/2020    Lab Results  Component Value Date   HGBA1C 7.7 (H) 07/14/2020   Last diabetic Eye exam:  Lab Results  Component Value Date/Time   HMDIABEYEEXA No Retinopathy 06/12/2019 12:00 AM    Last diabetic Foot exam: No results found for: HMDIABFOOTEX      Component Value Date/Time   CHOL 147 07/14/2020 0931   CHOL 214 (H) 07/06/2019 1201   TRIG 156.0 (H) 07/14/2020 0931   HDL 40.90 07/14/2020 0931   HDL 40 07/06/2019 1201   CHOLHDL 4 07/14/2020 0931   VLDL 31.2 07/14/2020 0931   LDLCALC 75 07/14/2020 0931   LDLCALC 130 (H) 07/06/2019 1201   LDLDIRECT 66.0 11/22/2018 1007    Hepatic Function Latest Ref Rng & Units 08/22/2020 07/14/2020 03/03/2020  Total Protein 6.1 - 8.1 g/dL 6.6 6.5 6.6  Albumin 3.5 - 5.2 g/dL - 4.1 4.2  AST 10 - 35 U/L _0 ALT 6 - 29 U/L _1 Alk Phosphatase 39 - 117 U/L - 103 90  Total Bilirubin 0.2 - 1.2 mg/dL 0.3 0.4 0.4    Lab Results  Component Value Date/Time   TSH 1.87 11/22/2018 10:07 AM   TSH 2.61 01/16/2018 02:38 PM    CBC Latest Ref Rng & Units 03/08/2018 02/22/2018 12/30/2014  WBC 4.0 - 10.5 K/uL 8.2 12.8(H) 8.1  Hemoglobin 12.0 - 15.0 g/dL 13.1 13.5 13.8  Hematocrit 36.0 - 46.0 % 39.0 40.2 41.1  Platelets 150.0 - 400.0 K/uL 328.0 302.0 283.0    Lab Results  Component Value Date/Time   VD25OH 53.26 03/03/2020 08:41 AM   VD25OH 83 08/09/2013 12:02 PM    Clinical ASCVD: No  The 10-year ASCVD risk score Mikey Bussing DC Jr., et al., 2013) is: 25.4%   Values used to calculate the score:     Age: 62 years     Sex: Female     Is Non-Hispanic African American: Yes     Diabetic: Yes     Tobacco smoker: No     Systolic Blood Pressure: 782 mmHg     Is BP treated: Yes     HDL Cholesterol: 40.9 mg/dL     Total Cholesterol: 147 mg/dL     Social History   Tobacco Use  Smoking Status Never Smoker  Smokeless Tobacco Never Used   BP Readings from Last 3  Encounters:  08/22/20 (!) 152/88  07/23/20 (!) 152/84  03/05/20 138/80   Pulse Readings from Last 3 Encounters:  08/22/20 95  07/23/20 89  03/05/20 84   Wt Readings from Last 3 Encounters:  08/22/20 195 lb 9.6 oz (88.7 kg)  07/28/20 193 lb (87.5 kg)  07/10/20 193 lb (87.5 kg)    Assessment: Review of patient past medical history, allergies, medications, health status, including review of consultants reports, laboratory and other test data, was performed as part of comprehensive evaluation and provision of chronic care management services.   SDOH:  (Social Determinants of Health) assessments and interventions performed:  SDOH Interventions   Flowsheet Row Most Recent Value  SDOH Interventions   Financial Strain Interventions Intervention Not Indicated      CCM Care Plan  Allergies  Allergen Reactions  . Citalopram Other  (See Comments)    Sick feeling and abdominal pain  . Codeine Nausea Only    hallucinations    Medications Reviewed Today    Reviewed by De Hollingshead, RPH-CPP (Pharmacist) on 10/21/20 at 1018  Med List Status: <None>  Medication Order Taking? Sig Documenting Provider Last Dose Status Informant  glipiZIDE (GLUCOTROL) 5 MG tablet [id number] Yes Take by mouth daily before breakfast. [provider] Taking Active   glucose blood (ONETOUCH VERIO) test strip [id number] Yes USE AS DIRECTED Once per   Archie Balboa, MD Taking Active   hydrochlorothiazide (HYDRODIURIL) 25 MG tablet [id number] Yes TAKE 1 TABLET (25 MG TOTAL) BY MOUTH DAILY. Crecencio Mc, MD Taking Active   OneTouch Delica Lancets [address] [place] [id number] Yes USE AS DIRECTED EVERY DAY Crecencio Mc, MD Taking Active   rosuvastatin (CRESTOR) 5 MG tablet [id number] Yes TAKE 1 TABLET BY MOUTH EVERY DAY Crecencio Mc, MD Taking Active   telmisartan (MICARDIS) 40 MG tablet [id number] Yes Take 1 tablet (40 mg total) by mouth at bedtime. Crecencio Mc, MD Taking Active           Patient Active Problem List   Diagnosis Date Noted  . Diabetes mellitus with proteinuric diabetic nephropathy ([place]) 07/15/2020  . Peripheral vascular disease, asymptomatic ([place]) 05/07/2020  . Breast lump or mass 01/03/2020  . Grief reaction 01/03/2020  . Morbid obesity ([place]) 04/23/2019  . Obesity, diabetes, and hypertension syndrome ([place]) 11/23/2018  . Nocturnal leg cramps 11/07/2018  . Personal history of kidney stones 03/11/2018  . Encounter for preventive health examination 01/17/2018  . GAD (generalized anxiety disorder) 12/31/2014  . Menopause syndrome 08/11/2013  . S/P TAH-BSO (total abdominal hysterectomy and bilateral salpingo-oophorectomy) 08/11/2013  . OSA (obstructive sleep apnea)   . Hyperlipidemia 06/01/2011  . Hypertension 06/01/2011    Immunization History  Administered Date(s) Administered  . Janssen (J&J) SARS-COV-2  Vaccination 11/29/2019  . PFIZER(Purple Top)SARS-COV-2 Vaccination 06/11/2020  . Zoster Recombinat (Shingrix) 07/26/2018, 12/15/2018    Conditions to be addressed/monitored: HTN, HLD and DMII  Care Plan : Medication Management  Updates made by De Hollingshead, RPH-CPP since 10/21/2020 12:00 AM    Problem: DM, Obesity, HLD, HTN     Long-Range Goal: Disease Progression Prevention   Start Date: 07/17/2020  This Visit's Progress: On track  Recent Progress: On track  Priority: High  Note:   Current Barriers:  . Unable to achieve control of diabetes, weight   Pharmacist Clinical Goal(s):  Marland Kitchen Over the next 90 days, patient will achieve control of diabetes as evidenced by A1c  through collaboration with PharmD and provider.  Interventions: . 1:1 collaboration with Crecencio Mc, MD regarding development and update of comprehensive plan of care as evidenced by provider attestation and co-signature . Inter-disciplinary care team collaboration (see longitudinal plan of care) . Comprehensive medication review performed; medication list updated in electronic medical record  Health Maintenance: . Up to date on COVID vaccinations . Due for foot exam, eye exam   Diabetes: . Uncontrolled but improved; current treatment: self-restarted glipizide 5 mg QAM o Hx metformin: nausea, diarrhea  o Hx Ozempic: bloating that was negatively impactful to quality of life . Patient notes that she wished she had tolerated Ozempic because she noticed the impact on her appetite, sugar cravings. Discussed that we could trial a less-potent GLP1, such as Trulicity or PO Rybelsus, vs SGLT2. Patient interested in trial of Trulicity. Reviewed similar mechanism, side effects to Ozempic, but that generally Trulicity is less effective in weight management side effects and better tolerated GI side effects. Patient verbalized understanding. Will come today to pick up Trulicity sample  Hypertension: . Generally  controlled; current treatment: telmisartan 40 mg daily, HCTZ 25 mg daily . Previously reported leg aches that she attributed to telmisartan. Previously encouraged patient to continue current regimen at this time to avoid making multiple medication changes at once. Rescheduled f/u appointment for ~ 4 weeks to re-address her reports of leg discomfort and determine if pharmacotherapy adjustments are needed.   Hyperlipidemia: . Controlled per last lipid panel; current treatment: rosuvastatin 5 mg daily   . Recommended to continue current regimen at this time  Patient Goals/Self-Care Activities . Over the next 90 days, patient will:  - take medications as prescribed check glucose daily, document, and provide at future appointments check blood pressure periodically, document, and provide at future appointments  Follow Up Plan: Telephone follow up appointment with care management team member scheduled for: ~ 2 weeks      Medication Assistance: None required.  Patient affirms current coverage meets needs.  [facility]  [id number] [place] [address] [place]Phone: [phone]: [phone]  Follow Up:  Patient agrees to Care Plan and Follow-up.  Plan: Telephone follow up appointment with care management team member scheduled for:  ~ 2 weeks   Catie TDarnelle Maffucci PharmD, BSouth [place] CWetzelClinical Pharmacist LOccidental Pet[phone]

## 2020-10-21 NOTE — Patient Instructions (Signed)
Visit Information  PATIENT GOALS: Goals Addressed              This Visit's Progress     Patient Stated   .  Medication Monitoring (pt-stated)        Patient Goals/Self-Care Activities . Over the next 90 days, patient will:  - take medications as prescribed check glucose daily, document, and provide at future appointments check blood pressure periodically, document, and provide at future appointments        Patient verbalizes understanding of instructions provided today and agrees to view in MyChart.    Plan: Telephone follow up appointment with care management team member scheduled for:  ~ 2 weeks   Catie Feliz Beam, PharmD, [place], CPP Clinical Pharmacist [employer] [facility] [employer] [phone]

## 2020-10-21 NOTE — Telephone Encounter (Signed)
noted 

## 2020-10-21 NOTE — Telephone Encounter (Signed)
Pt called and states that she has been off of Ozempic for the past three weeks. And her blood sugar keeps climbing to over 200. She states that she started taking the glipizide 5mg  to have something in her system to help until her appt with Catie on 10/29/20. She wants to know if she should go back on Metformin. She states that she does not want to go on any other of the meds that she has been on previously because she does not want side affects again. Pt wanted to speak with 12/29/20 and also send message to Catie as well. Please advise

## 2020-10-29 ENCOUNTER — Telehealth: Payer: Medicare HMO

## 2020-11-04 ENCOUNTER — Ambulatory Visit: Payer: Medicare HMO | Admitting: Pharmacist

## 2020-11-04 DIAGNOSIS — E7800 Pure hypercholesterolemia, unspecified: Secondary | ICD-10-CM

## 2020-11-04 DIAGNOSIS — I10 Essential (primary) hypertension: Secondary | ICD-10-CM

## 2020-11-04 DIAGNOSIS — E1165 Type 2 diabetes mellitus with hyperglycemia: Secondary | ICD-10-CM

## 2020-11-04 NOTE — Chronic Care Management (AMB) (Signed)
**Note Penny-Identified via Obfuscation** Chronic Care Management Pharmacy Note  11/04/2020 Name:  Penny Butler MRN:  [id number] DOB:  [birth year]  Subjective: Penny Butler is an 70 y.o. year old female who is a primary patient of Penny Butler, Penny Everts, MD.  The CCM team was consulted for assistance with disease management and care coordination needs.    Engaged with patient by telephone for follow up visit in response to provider referral for pharmacy case management and/or care coordination services.   Consent to Services:  The patient was given information about Chronic Care Management services, agreed to services, and gave verbal consent prior to initiation of services.  Please see initial visit note for detailed documentation.   Patient Care Team: Penny Mc, MD as PCP - General (Internal Medicine) Penny Butler, RPH-CPP (Pharmacist)  Recent office visits: None since our last call  Recent consult visits: None since our last call  Hospital visits: None in previous 6 months  Objective:  Lab Results  Component Value Date   CREATININE 0.92 08/22/2020   CREATININE 0.94 07/23/2020   CREATININE 0.79 07/14/2020    Lab Results  Component Value Date   HGBA1C 7.7 (H) 07/14/2020   Last diabetic Eye exam:  Lab Results  Component Value Date/Time   HMDIABEYEEXA No Retinopathy 06/12/2019 12:00 AM    Last diabetic Foot exam: No results found for: HMDIABFOOTEX      Component Value Date/Time   CHOL 147 07/14/2020 0931   CHOL 214 (H) 07/06/2019 1201   TRIG 156.0 (H) 07/14/2020 0931   HDL 40.90 07/14/2020 0931   HDL 40 07/06/2019 1201   CHOLHDL 4 07/14/2020 0931   VLDL 31.2 07/14/2020 0931   LDLCALC 75 07/14/2020 0931   LDLCALC 130 (H) 07/06/2019 1201   LDLDIRECT 66.0 11/22/2018 1007    Hepatic Function Latest Ref Rng & Units 08/22/2020 07/14/2020 03/03/2020  Total Protein 6.1 - 8.1 g/dL 6.6 6.5 6.6  Albumin 3.5 - 5.2 g/dL - 4.1 4.2  AST 10 - 35 U/L 19 16 15   ALT 6 - 29 U/L 23 20 20   Alk  Phosphatase 39 - 117 U/L - 103 90  Total Bilirubin 0.2 - 1.2 mg/dL 0.3 0.4 0.4    Lab Results  Component Value Date/Time   TSH 1.87 11/22/2018 10:07 AM   TSH 2.61 01/16/2018 02:38 PM    CBC Latest Ref Rng & Units 03/08/2018 02/22/2018 12/30/2014  WBC 4.0 - 10.5 K/uL 8.2 12.8(H) 8.1  Hemoglobin 12.0 - 15.0 g/dL 13.1 13.5 13.8  Hematocrit 36.0 - 46.0 % 39.0 40.2 41.1  Platelets 150.0 - 400.0 K/uL 328.0 302.0 283.0    Lab Results  Component Value Date/Time   VD25OH 53.26 03/03/2020 08:41 AM   VD25OH 83 08/09/2013 12:02 PM    Clinical ASCVD: No  The 10-year ASCVD risk score Mikey Bussing DC Jr., et al., 2013) is: 25.4%   Values used to calculate the score:     Age: 74 years     Sex: Female     Is Non-Hispanic African American: Yes     Diabetic: Yes     Tobacco smoker: No     Systolic Blood Pressure: 875 mmHg     Is BP treated: Yes     HDL Cholesterol: 40.9 mg/dL     Total Cholesterol: 147 mg/dL      Social History   Tobacco Use  Smoking Status Never Smoker  Smokeless Tobacco Never Used   BP Readings from Last 3 Encounters:  08/22/20 Marland Kitchen)  152/88  07/23/20 (!) 152/84  03/05/20 138/80   Pulse Readings from Last 3 Encounters:  08/22/20 95  07/23/20 89  03/05/20 84   Wt Readings from Last 3 Encounters:  08/22/20 195 lb 9.6 oz (88.7 kg)  07/28/20 193 lb (87.5 kg)  07/10/20 193 lb (87.5 kg)    Assessment: Review of patient past medical history, allergies, medications, health status, including review of consultants reports, laboratory and other test data, was performed as part of comprehensive evaluation and provision of chronic care management services.   SDOH:  (Social Determinants of Health) assessments and interventions performed:  SDOH Interventions   Flowsheet Row Most Recent Value  SDOH Interventions   Financial Strain Interventions Intervention Not Indicated      CCM Care Plan  Allergies  Allergen Reactions  . Citalopram Other (See Comments)    Sick feeling  and abdominal pain  . Codeine Nausea Only    hallucinations    Medications Reviewed Today    Reviewed by Penny Butler, RPH-CPP (Pharmacist) on 11/04/20 at 1157  Med List Status: <None>  Medication Order Taking? Sig Documenting Provider Last Dose Status Informant  Dulaglutide (TRULICITY) 0.93 OI/7.1IW SOPN [id number] Yes Inject 0.75 mg into the skin once a week. Penny Mc, MD Taking Active   glipiZIDE (GLUCOTROL) 5 MG tablet [id number] No Take by mouth daily before breakfast.  Patient not taking: Reported on 11/04/2020   [provider] Not Taking Active   glucose blood (ONETOUCH VERIO) test strip [id number]  USE AS DIRECTED Once per   Penny Balboa, MD  Active   hydrochlorothiazide (HYDRODIURIL) 25 MG tablet [id number] Yes TAKE 1 TABLET (25 MG TOTAL) BY MOUTH DAILY. Penny Mc, MD Taking Active   OneTouch Delica Lancets [address] [place] [id number]  USE AS DIRECTED EVERY DAY Penny Mc, MD  Active   rosuvastatin (CRESTOR) 5 MG tablet [id number] Yes TAKE 1 TABLET BY MOUTH EVERY DAY Penny Mc, MD Taking Active   telmisartan (MICARDIS) 40 MG tablet [id number] Yes Take 1 tablet (40 mg total) by mouth at bedtime. Penny Mc, MD Taking Active           Patient Active Problem List   Diagnosis Date Noted  . Diabetes mellitus with proteinuric diabetic nephropathy ([place]) 07/15/2020  . Peripheral vascular disease, asymptomatic ([place]) 05/07/2020  . Breast lump or mass 01/03/2020  . Grief reaction 01/03/2020  . Morbid obesity ([place]) 04/23/2019  . Obesity, diabetes, and hypertension syndrome ([place]) 11/23/2018  . Nocturnal leg cramps 11/07/2018  . Personal history of kidney stones 03/11/2018  . Encounter for preventive health examination 01/17/2018  . GAD (generalized anxiety disorder) 12/31/2014  . Menopause syndrome 08/11/2013  . S/P TAH-BSO (total abdominal hysterectomy and bilateral salpingo-oophorectomy) 08/11/2013  . OSA (obstructive sleep apnea)   .  Hyperlipidemia 06/01/2011  . Hypertension 06/01/2011    Immunization History  Administered Date(s) Administered  . Janssen (J&J) SARS-COV-2 Vaccination 11/29/2019  . PFIZER(Purple Top)SARS-COV-2 Vaccination 06/11/2020  . Zoster Recombinat (Shingrix) 07/26/2018, 12/15/2018    Conditions to be addressed/monitored: HTN, HLD and DMII  Care Plan : Medication Management  Updates made by Penny Butler, RPH-CPP since 11/04/2020 12:00 AM    Problem: DM, Obesity, HLD, HTN     Long-Range Goal: Disease Progression Prevention   Start Date: 07/17/2020  This Visit's Progress: On track  Recent Progress: On track  Priority: High  Note:   Current Barriers:  . Unable to achieve control of diabetes, weight  Pharmacist Clinical Goal(s):  Marland Kitchen Over the next 90 days, patient will achieve control of diabetes as evidenced by A1c  through collaboration with PharmD and provider.   Interventions: . 1:1 collaboration with Penny Mc, MD regarding development and update of comprehensive plan of care as evidenced by provider attestation and co-signature . Inter-disciplinary care team collaboration (see longitudinal plan of care) . Comprehensive medication review performed; medication list updated in electronic medical record  Health Maintenance: . Up to date on COVID vaccinations . Due for foot exam, eye exam. Previously recommended scheduling.   Diabetes: . Uncontrolled but improved; current treatment: glipizide 5 mg QAM - though stopped when starting Trulicity, Trulicity 0.30 mg  . Some infrequent, mild heart burn. Possibly related to later meals.   o Hx metformin: nausea, diarrhea  o Hx Ozempic: bloating that was negatively impactful to quality of life . Current glucose readings: fasting: 194 this morning, but other days 160s; reports cravings last night for sugar (hormonal per her report) . Continue Trulicity 0.92 mg weekly. Patient requests 2 more sample weeks before purchasing. Will  prepare for patient.  . Restart glipizide 5 mg QAM with breakfast. Advised that if development of hypoglycemia or symptoms (lightheadedness, shakiness, hunger) in late morning, reduce glipizide to 2.5 mg QAM.  . F/u in 2 weeks to determine whether to increase to 1.5 mg weekly or continue 0.75 mg weekly .  Hypertension: . Generally controlled; current treatment: telmisartan 40 mg daily, HCTZ 25 mg daily . Previously reported leg aches that she attributed to telmisartan. Previously encouraged patient to continue current regimen at this time to avoid making multiple medication changes at once. No concerns noted today regarding leg aches.   Hyperlipidemia: . Controlled per last lipid panel; current treatment: rosuvastatin 5 mg daily  . Previously recommended to continue current regimen at this time  Patient Goals/Self-Care Activities . Over the next 90 days, patient will:  - take medications as prescribed check glucose daily, document, and provide at future appointments check blood pressure periodically, document, and provide at future appointments  Follow Up Plan: Telephone follow up appointment with care management team member scheduled for: ~ 2 weeks      Medication Assistance: None required.  Patient affirms current coverage meets needs.  Patient's preferred pharmacy is:  [id number] [place] [address] [id number]: [phone]: [phone]  Follow Up:  Patient agrees to Care Plan and Follow-up.  Plan: Telephone follow up appointment with care management team member scheduled for:  ~ 2 weeks  Catie TDarnelle Maffucci PharmD, BL[place] CDaytonClinical Pharmacist [place] [phone]

## 2020-11-04 NOTE — Patient Instructions (Signed)
Visit Information  PATIENT GOALS: Goals Addressed              This Visit's Progress     Patient Stated   .  Medication Monitoring (pt-stated)        Patient Goals/Self-Care Activities . Over the next 90 days, patient will:  - take medications as prescribed check glucose daily, document, and provide at future appointments check blood pressure periodically, document, and provide at future appointments       Patient verbalizes understanding of instructions provided today and agrees to view in MyChart.  Plan: Telephone follow up appointment with care management team member scheduled for:  ~ 2 weeks  Catie Feliz Beam, PharmD, [place], CPP Clinical Pharmacist [employer] at [employer] [phone]

## 2020-11-13 ENCOUNTER — Ambulatory Visit: Payer: Medicare HMO | Admitting: Pharmacist

## 2020-11-13 DIAGNOSIS — E1169 Type 2 diabetes mellitus with other specified complication: Secondary | ICD-10-CM

## 2020-11-13 DIAGNOSIS — E785 Hyperlipidemia, unspecified: Secondary | ICD-10-CM

## 2020-11-13 DIAGNOSIS — I10 Essential (primary) hypertension: Secondary | ICD-10-CM

## 2020-11-13 DIAGNOSIS — E7800 Pure hypercholesterolemia, unspecified: Secondary | ICD-10-CM | POA: Diagnosis not present

## 2020-11-13 DIAGNOSIS — E1165 Type 2 diabetes mellitus with hyperglycemia: Secondary | ICD-10-CM | POA: Diagnosis not present

## 2020-11-13 MED ORDER — TRULICITY 1.5 MG/0.5ML ~~LOC~~ SOAJ: 1.5000 mg | SUBCUTANEOUS | 0 refills | Status: DC

## 2020-11-13 NOTE — Chronic Care Management (AMB) (Signed)
Chronic Care Management Pharmacy Note  11/13/2020 Name:  Penny Butler MRN:  [id number] DOB:  28-Jan-1951  Subjective: Penny Butler is an 70 y.o. year old female who is a primary patient of Derrel Nip, Aris Everts, MD.  The CCM team was consulted for assistance with disease management and care coordination needs.    Engaged with patient by telephone for follow up visit in response to provider referral for pharmacy case management and/or care coordination services.   Consent to Services:  The patient was given information about Chronic Care Management services, agreed to services, and gave verbal consent prior to initiation of services.  Please see initial visit note for detailed documentation.   Patient Care Team: Crecencio Mc, MD as PCP - General (Internal Medicine) De Hollingshead, RPH-CPP (Pharmacist)  Recent office visits: None since our last visit  Recent consult visits: None since our last visit  Hospital visits: None in previous 6 months  Objective:  Lab Results  Component Value Date   CREATININE 0.92 08/22/2020   CREATININE 0.94 07/23/2020   CREATININE 0.79 07/14/2020    Lab Results  Component Value Date   HGBA1C 7.7 (H) 07/14/2020   Last diabetic Eye exam:  Lab Results  Component Value Date/Time   HMDIABEYEEXA No Retinopathy 06/12/2019 12:00 AM    Last diabetic Foot exam: No results found for: HMDIABFOOTEX      Component Value Date/Time   CHOL 147 07/14/2020 0931   CHOL 214 (H) 07/06/2019 1201   TRIG 156.0 (H) 07/14/2020 0931   HDL 40.90 07/14/2020 0931   HDL 40 07/06/2019 1201   CHOLHDL 4 07/14/2020 0931   VLDL 31.2 07/14/2020 0931   LDLCALC 75 07/14/2020 0931   LDLCALC 130 (H) 07/06/2019 1201   LDLDIRECT 66.0 11/22/2018 1007    Hepatic Function Latest Ref Rng & Units 08/22/2020 07/14/2020 03/03/2020  Total Protein 6.1 - 8.1 g/dL 6.6 6.5 6.6  Albumin 3.5 - 5.2 g/dL - 4.1 4.2  AST 10 - 35 U/L _0 ALT 6 - 29 U/L _1 Alk  Phosphatase 39 - 117 U/L - 103 90  Total Bilirubin 0.2 - 1.2 mg/dL 0.3 0.4 0.4    Lab Results  Component Value Date/Time   TSH 1.87 11/22/2018 10:07 AM   TSH 2.61 01/16/2018 02:38 PM    CBC Latest Ref Rng & Units 03/08/2018 02/22/2018 12/30/2014  WBC 4.0 - 10.5 K/uL 8.2 12.8(H) 8.1  Hemoglobin 12.0 - 15.0 g/dL 13.1 13.5 13.8  Hematocrit 36.0 - 46.0 % 39.0 40.2 41.1  Platelets 150.0 - 400.0 K/uL 328.0 302.0 283.0    Lab Results  Component Value Date/Time   VD25OH 53.26 03/03/2020 08:41 AM   VD25OH 83 08/09/2013 12:02 PM    Clinical ASCVD: No  The 10-year ASCVD risk score Mikey Bussing DC Jr., et al., 2013) is: 25.4%   Values used to calculate the score:     Age: 25 years     Sex: Female     Is Non-Hispanic African American: Yes     Diabetic: Yes     Tobacco smoker: No     Systolic Blood Pressure: 409 mmHg     Is BP treated: Yes     HDL Cholesterol: 40.9 mg/dL     Total Cholesterol: 147 mg/dL    Social History   Tobacco Use  Smoking Status Never Smoker  Smokeless Tobacco Never Used   BP Readings from Last 3 Encounters:  08/22/20 (!) 152/88  07/23/20 (!) 152/84  03/05/20 138/80   Pulse Readings from Last 3 Encounters:  08/22/20 95  07/23/20 89  03/05/20 84   Wt Readings from Last 3 Encounters:  08/22/20 195 lb 9.6 oz (88.7 kg)  07/28/20 193 lb (87.5 kg)  07/10/20 193 lb (87.5 kg)    Assessment: Review of patient past medical history, allergies, medications, health status, including review of consultants reports, laboratory and other test data, was performed as part of comprehensive evaluation and provision of chronic care management services.   SDOH:  (Social Determinants of Health) assessments and interventions performed:  SDOH Interventions   Flowsheet Row Most Recent Value  SDOH Interventions   Financial Strain Interventions Intervention Not Indicated      CCM Care Plan  Allergies  Allergen Reactions  . Citalopram Other (See Comments)    Sick feeling and  abdominal pain  . Codeine Nausea Only    hallucinations    Medications Reviewed Today    Reviewed by De Hollingshead, RPH-CPP (Pharmacist) on 11/04/20 at 1157  Med List Status: <None>  Medication Order Taking? Sig Documenting Provider Last Dose Status Informant  Dulaglutide (TRULICITY) 4.12 IN/8.6VE SOPN [id number] Yes Inject 0.75 mg into the skin once a week. Crecencio Mc, MD Taking Active   glipiZIDE (GLUCOTROL) 5 MG tablet [id number] No Take by mouth daily before breakfast.  Patient not taking: Reported on 11/04/2020   [provider] Not Taking Active   glucose blood (ONETOUCH VERIO) test strip [id number]  USE AS DIRECTED Once per   Archie Balboa, MD  Active   hydrochlorothiazide (HYDRODIURIL) 25 MG tablet [id number] Yes TAKE 1 TABLET (25 MG TOTAL) BY MOUTH DAILY. Crecencio Mc, MD Taking Active   OneTouch Delica Lancets [address] [place] [id number]  USE AS DIRECTED EVERY DAY Crecencio Mc, MD  Active   rosuvastatin (CRESTOR) 5 MG tablet [id number] Yes TAKE 1 TABLET BY MOUTH EVERY DAY Crecencio Mc, MD Taking Active   telmisartan (MICARDIS) 40 MG tablet [id number] Yes Take 1 tablet (40 mg total) by mouth at bedtime. Crecencio Mc, MD Taking Active           Patient Active Problem List   Diagnosis Date Noted  . Diabetes mellitus with proteinuric diabetic nephropathy ([place]) 07/15/2020  . Peripheral vascular disease, asymptomatic ([place]) 05/07/2020  . Breast lump or mass 01/03/2020  . Grief reaction 01/03/2020  . Morbid obesity ([place]) 04/23/2019  . Obesity, diabetes, and hypertension syndrome ([place]) 11/23/2018  . Nocturnal leg cramps 11/07/2018  . Personal history of kidney stones 03/11/2018  . Encounter for preventive health examination 01/17/2018  . GAD (generalized anxiety disorder) 12/31/2014  . Menopause syndrome 08/11/2013  . S/P TAH-BSO (total abdominal hysterectomy and bilateral salpingo-oophorectomy) 08/11/2013  . OSA (obstructive sleep apnea)   . Hyperlipidemia  06/01/2011  . Hypertension 06/01/2011    Immunization History  Administered Date(s) Administered  . Janssen (J&J) SARS-COV-2 Vaccination 11/29/2019  . PFIZER(Purple Top)SARS-COV-2 Vaccination 06/11/2020  . Zoster Recombinat (Shingrix) 07/26/2018, 12/15/2018    Conditions to be addressed/monitored: HTN, HLD and DMII  Care Plan : Medication Management  Updates made by De Hollingshead, RPH-CPP since 11/13/2020 12:00 AM    Problem: DM, Obesity, HLD, HTN     Long-Range Goal: Disease Progression Prevention   Start Date: 07/17/2020  This Visit's Progress: On track  Recent Progress: On track  Priority: High  Note:   Current Barriers:  . Unable to achieve control of diabetes, weight   Pharmacist  Clinical Goal(s):  Marland Kitchen Over the next 90 days, patient will achieve control of diabetes as evidenced by A1c  through collaboration with PharmD and provider.   Interventions: . 1:1 collaboration with Crecencio Mc, MD regarding development and update of comprehensive plan of care as evidenced by provider attestation and co-signature . Inter-disciplinary care team collaboration (see longitudinal plan of care) . Comprehensive medication review performed; medication list updated in electronic medical record  Health Maintenance: . Up to date on COVID vaccinations . Due for foot exam, eye exam. Previously recommended scheduling.   Diabetes: . Uncontrolled but improved; current treatment: Trulicity 2.77 mg x 3 weeks, glipizide 2.5 mg QAM (reports shakiness if she takes 5 mg) . No bloating symptoms since starting Trulicity   o Hx metformin: nausea, diarrhea  o Hx Ozempic: bloating that was negatively impactful to quality of life . Current glucose readings: fasting: 150-160s . Calls today confused because the pharmacy filled Trulicity for her. Discussed that was sent when I added it to her chart, and that she can ask the pharmacy to place the medication on hold. She notes that since she is  tolerating Trulicity, she would like to increase to 1.5 mg weekly. Complete 4 weeks of Trulicity 8.24 mg, then start Trulicity 1.5 mg weekly. Preparing sample for her  Hypertension: . Generally controlled; current treatment: telmisartan 40 mg daily, HCTZ 25 mg daily . Previously reported leg aches that she attributed to telmisartan. Previously encouraged patient to continue current regimen at this time to avoid making multiple medication changes at once. No concerns noted today regarding leg aches.  . Will discuss BP moving forward.   Hyperlipidemia: . Controlled per last lipid panel; current treatment: rosuvastatin 5 mg daily  . Previously recommended to continue current regimen at this time.   Patient Goals/Self-Care Activities . Over the next 90 days, patient will:  - take medications as prescribed check glucose daily, document, and provide at future appointments check blood pressure periodically, document, and provide at future appointments  Follow Up Plan: Telephone follow up appointment with care management team member scheduled for: ~ 2 weeks      Medication Assistance: None required.  Patient affirms current coverage meets needs.  Patient[facility] pharmacy [facility]  [id number] [place] [address] [id number]: [phone]: [phone]   Follow Up:  Patient agrees to Care Plan and Follow-up.  Plan: Telephone follow up appointment with care management team member scheduled for:  ~ 2 weeks  Catie TDarnelle Maffucci PharmD, BSummers[place] CLos EbanosClinical Pharmacist [place] [phone]

## 2020-11-13 NOTE — Patient Instructions (Signed)
Visit Information  PATIENT GOALS: Goals Addressed              This Visit's Progress     Patient Stated   .  Medication Monitoring (pt-stated)        Patient Goals/Self-Care Activities . Over the next 90 days, patient will:  - take medications as prescribed check glucose daily, document, and provide at future appointments check blood pressure periodically, document, and provide at future appointments        Patient verbalizes understanding of instructions provided today and agrees to view in MyChart.    Plan: Telephone follow up appointment with care management team member scheduled for:  ~ 2 weeks   Catie Haddie Bruhl, PharmD, BCACP, CPP Clinical Pharmacist [facility] at [place] [phone]  

## 2020-11-14 DIAGNOSIS — G4733 Obstructive sleep apnea (adult) (pediatric): Secondary | ICD-10-CM | POA: Diagnosis not present

## 2020-11-14 DIAGNOSIS — I10 Essential (primary) hypertension: Secondary | ICD-10-CM | POA: Diagnosis not present

## 2020-11-18 ENCOUNTER — Telehealth: Payer: Medicare HMO

## 2020-11-28 ENCOUNTER — Other Ambulatory Visit: Payer: Self-pay | Admitting: Internal Medicine

## 2020-12-01 ENCOUNTER — Ambulatory Visit: Payer: Medicare HMO | Admitting: Pharmacist

## 2020-12-01 DIAGNOSIS — E7800 Pure hypercholesterolemia, unspecified: Secondary | ICD-10-CM

## 2020-12-01 DIAGNOSIS — I10 Essential (primary) hypertension: Secondary | ICD-10-CM

## 2020-12-01 DIAGNOSIS — E1165 Type 2 diabetes mellitus with hyperglycemia: Secondary | ICD-10-CM

## 2020-12-01 MED ORDER — TRULICITY 1.5 MG/0.5ML ~~LOC~~ SOAJ: 1.5000 mg | SUBCUTANEOUS | 2 refills | Status: DC

## 2020-12-01 NOTE — Chronic Care Management (AMB) (Signed)
Chronic Care Management Pharmacy Note  12/01/2020 Name:  Penny Butler MRN:  [id number] DOB:  August 10, 1950   Subjective: Penny Butler is an 70 y.o. year old female who is a primary patient of Derrel Nip, Aris Everts, MD.  The CCM team was consulted for assistance with disease management and care coordination needs.    Engaged with patient by telephone for  medication access question  in response to provider referral for pharmacy case management and/or care coordination services.   Consent to Services:  The patient was given information about Chronic Care Management services, agreed to services, and gave verbal consent prior to initiation of services.  Please see initial visit note for detailed documentation.   Patient Care Team: Crecencio Mc, MD as PCP - General (Internal Medicine) De Hollingshead, RPH-CPP (Pharmacist)  Recent office visits: None since our last call  Recent consult visits: None since our last call  Hospital visits: None in previous 6 months  Objective:  Lab Results  Component Value Date   CREATININE 0.92 08/22/2020   CREATININE 0.94 07/23/2020   CREATININE 0.79 07/14/2020    Lab Results  Component Value Date   HGBA1C 7.7 (H) 07/14/2020   Last diabetic Eye exam:  Lab Results  Component Value Date/Time   HMDIABEYEEXA No Retinopathy 06/12/2019 12:00 AM    Last diabetic Foot exam: No results found for: HMDIABFOOTEX      Component Value Date/Time   CHOL 147 07/14/2020 0931   CHOL 214 (H) 07/06/2019 1201   TRIG 156.0 (H) 07/14/2020 0931   HDL 40.90 07/14/2020 0931   HDL 40 07/06/2019 1201   CHOLHDL 4 07/14/2020 0931   VLDL 31.2 07/14/2020 0931   LDLCALC 75 07/14/2020 0931   LDLCALC 130 (H) 07/06/2019 1201   LDLDIRECT 66.0 11/22/2018 1007    Hepatic Function Latest Ref Rng & Units 08/22/2020 07/14/2020 03/03/2020  Total Protein 6.1 - 8.1 g/dL 6.6 6.5 6.6  Albumin 3.5 - 5.2 g/dL - 4.1 4.2  AST 10 - 35 U/L 19 16 15   ALT 6 - 29 U/L 23 20 20    Alk Phosphatase 39 - 117 U/L - 103 90  Total Bilirubin 0.2 - 1.2 mg/dL 0.3 0.4 0.4    Lab Results  Component Value Date/Time   TSH 1.87 11/22/2018 10:07 AM   TSH 2.61 01/16/2018 02:38 PM    CBC Latest Ref Rng & Units 03/08/2018 02/22/2018 12/30/2014  WBC 4.0 - 10.5 K/uL 8.2 12.8(H) 8.1  Hemoglobin 12.0 - 15.0 g/dL 13.1 13.5 13.8  Hematocrit 36.0 - 46.0 % 39.0 40.2 41.1  Platelets 150.0 - 400.0 K/uL 328.0 302.0 283.0    Lab Results  Component Value Date/Time   VD25OH 53.26 03/03/2020 08:41 AM   VD25OH 83 08/09/2013 12:02 PM    Clinical ASCVD: No  The 10-year ASCVD risk score Mikey Bussing DC Jr., et al., 2013) is: 25.4%   Values used to calculate the score:     Age: 27 years     Sex: Female     Is Non-Hispanic African American: Yes     Diabetic: Yes     Tobacco smoker: No     Systolic Blood Pressure: 778 mmHg     Is BP treated: Yes     HDL Cholesterol: 40.9 mg/dL     Total Cholesterol: 147 mg/dL      Social History   Tobacco Use  Smoking Status Never  Smokeless Tobacco Never   BP Readings from Last 3 Encounters:  08/22/20 (!) 152/88  07/23/20 (!) 152/84  03/05/20 138/80   Pulse Readings from Last 3 Encounters:  08/22/20 95  07/23/20 89  03/05/20 84   Wt Readings from Last 3 Encounters:  08/22/20 195 lb 9.6 oz (88.7 kg)  07/28/20 193 lb (87.5 kg)  07/10/20 193 lb (87.5 kg)    Assessment: Review of patient past medical history, allergies, medications, health status, including review of consultants reports, laboratory and other test data, was performed as part of comprehensive evaluation and provision of chronic care management services.   SDOH:  (Social Determinants of Health) assessments and interventions performed:    CCM Care Plan  Allergies  Allergen Reactions   Citalopram Other (See Comments)    Sick feeling and abdominal pain   Codeine Nausea Only    hallucinations    Medications Reviewed Today     Reviewed by De Hollingshead, RPH-CPP  (Pharmacist) on 11/04/20 at 1157  Med List Status: <None>   Medication Order Taking? Sig Documenting Provider Last Dose Status Informant  Dulaglutide (TRULICITY) 6.01 UX/3.2TF SOPN [id number] Yes Inject 0.75 mg into the skin once a week. Crecencio Mc, MD Taking Active   glipiZIDE (GLUCOTROL) 5 MG tablet [id number] No Take by mouth daily before breakfast.  Patient not taking: Reported on 11/04/2020   [provider] Not Taking Active   glucose blood (ONETOUCH VERIO) test strip [id number]  USE AS DIRECTED Once per   Archie Balboa, MD  Active   hydrochlorothiazide (HYDRODIURIL) 25 MG tablet [id number] Yes TAKE 1 TABLET (25 MG TOTAL) BY MOUTH DAILY. Crecencio Mc, MD Taking Active   OneTouch Delica Lancets [address] [id number]  USE AS DIRECTED EVERY DAY Crecencio Mc, MD  Active   rosuvastatin (CRESTOR) 5 MG tablet [id number] Yes TAKE 1 TABLET BY MOUTH EVERY DAY Crecencio Mc, MD Taking Active   telmisartan (MICARDIS) 40 MG tablet [id number] Yes Take 1 tablet (40 mg total) by mouth at bedtime. Crecencio Mc, MD Taking Active             Patient Active Problem List   Diagnosis Date Noted   Diabetes mellitus with proteinuric diabetic nephropathy ([place]) 07/15/2020   Peripheral vascular disease, asymptomatic ([place]) 05/07/2020   Breast lump or mass 01/03/2020   Grief reaction 01/03/2020   Morbid obesity ([place]) 04/23/2019   Obesity, diabetes, and hypertension syndrome ([place]) 11/23/2018   Nocturnal leg cramps 11/07/2018   Personal history of kidney stones 03/11/2018   Encounter for preventive health examination 01/17/2018   GAD (generalized anxiety disorder) 12/31/2014   Menopause syndrome 08/11/2013   S/P TAH-BSO (total abdominal hysterectomy and bilateral salpingo-oophorectomy) 08/11/2013   OSA (obstructive sleep apnea)    Hyperlipidemia 06/01/2011   Hypertension 06/01/2011    Immunization History  Administered Date(s) Administered   Janssen (J&J) SARS-COV-2 Vaccination  11/29/2019   PFIZER(Purple Top)SARS-COV-2 Vaccination 06/11/2020   Zoster Recombinat (Shingrix) 07/26/2018, 12/15/2018    Conditions to be addressed/monitored: HTN, HLD, and DMII  Care Plan : Medication Management  Updates made by De Hollingshead, RPH-CPP since 12/01/2020 12:00 AM     Problem: DM, Obesity, HLD, HTN      Long-Range Goal: Disease Progression Prevention   Start Date: 07/17/2020  This Visit's Progress: On track  Recent Progress: On track  Priority: High  Note:   Current Barriers:  Unable to achieve control of diabetes, weight   Pharmacist Clinical Goal(s):  Over the next 90 days, patient will achieve control of diabetes  as evidenced by A1c  through collaboration with PharmD and provider.   Interventions: 1:1 collaboration with Crecencio Mc, MD regarding development and update of comprehensive plan of care as evidenced by provider attestation and co-signature Inter-disciplinary care team collaboration (see longitudinal plan of care) Comprehensive medication review performed; medication list updated in electronic medical record  Health Maintenance: Up to date on COVID vaccinations Due for foot exam, eye exam. Previously recommended scheduling.   Diabetes: Uncontrolled but improved; current treatment: Trulicity 1.5 mg weekly x 2 weeks  No bloating symptoms since increasing Trulicity dose Hx metformin: nausea, diarrhea  Hx Ozempic: bloating that was negatively impactful to quality of life Current glucose readings: fasting: 150-180s Discussed to continue this dose of Trulicity for at least 4 weeks prior to determining if dose needs to be increased. Script for a 1 month supply of Trulicity 1.5 mg sent to her pharmacy. Rescheduled f/u phone call for 4 weeks from today  Hypertension: Generally controlled; current treatment: telmisartan 40 mg daily, HCTZ 25 mg daily Previously reported leg aches that she attributed to telmisartan. Previously encouraged patient  to continue current regimen at this time to avoid making multiple medication changes at once. No concerns noted today regarding leg aches.  Will discuss BP moving forward.   Hyperlipidemia: Controlled per last lipid panel; current treatment: rosuvastatin 5 mg daily  Previously recommended to continue current regimen at this time.   Patient Goals/Self-Care Activities Over the next 90 days, patient will:  - take medications as prescribed check glucose daily, document, and provide at future appointments check blood pressure periodically, document, and provide at future appointments  Follow Up Plan: Telephone follow up appointment with care management team member scheduled for: ~ 4 weeks      Medication Assistance: None required.  Patient affirms current coverage meets needs.  Patient's preferred pharmacy is:  [id number] [address]Phone: [phone]: [phone] [id number] [place], [place]: [phone]: [phone]  Follow Up:  Patient agrees to Care Plan and Follow-up.  Plan: Telephone follow up appointment with care management team member scheduled for:  4 weeks  Catie TrDarnelle MaffucciPharmD, BCYpsilantiCPWineglasslinical Pharmacist [phone]

## 2020-12-01 NOTE — Patient Instructions (Signed)
Visit Information  PATIENT GOALS:  Goals Addressed               This Visit's Progress     Patient Stated     Medication Monitoring (pt-stated)        Patient Goals/Self-Care Activities Over the next 90 days, patient will:  - take medications as prescribed check glucose daily, document, and provide at future appointments check blood pressure periodically, document, and provide at future appointments          Patient verbalizes understanding of instructions provided today and agrees to view in MyChart.   Plan: Telephone follow up appointment with care management team member scheduled for:  4 weeks  Catie Feliz Beam, PharmD, [place], CPP Clinical Pharmacist [employer] [facility] [employer] [phone]

## 2020-12-02 ENCOUNTER — Other Ambulatory Visit: Payer: Self-pay | Admitting: Internal Medicine

## 2020-12-02 DIAGNOSIS — Z1231 Encounter for screening mammogram for malignant neoplasm of breast: Secondary | ICD-10-CM

## 2020-12-04 ENCOUNTER — Telehealth: Payer: Medicare HMO

## 2020-12-11 ENCOUNTER — Telehealth: Payer: Self-pay

## 2020-12-11 NOTE — Telephone Encounter (Signed)
Spoke with pt and informed her that she needs to stay on this dose for the full 4 weeks and if no change still then we can increase it to the 3 mg dose. Pt gave a verbal understanding.

## 2020-12-11 NOTE — Telephone Encounter (Signed)
Pt called about her Trulicity. Pt stated that she has been working with Catie for about two months or so but would like to let Dr. Darrick Huntsman know that she doesn't think that the medication is helping her. She has been taking Trulicity for several weeks and has been on the 1.5 mg dose for about 2 weeks. She stated that it is not helping to curve her appetite, her sugar readings are being elevated and she is gaining weight. Pt is wanting to know if the dose can be increased or if there is something else that she can try. Pt has tried Ozempic and developed side effects from it when increasing the dose.

## 2020-12-15 DIAGNOSIS — I10 Essential (primary) hypertension: Secondary | ICD-10-CM | POA: Diagnosis not present

## 2020-12-15 DIAGNOSIS — G4733 Obstructive sleep apnea (adult) (pediatric): Secondary | ICD-10-CM | POA: Diagnosis not present

## 2020-12-17 ENCOUNTER — Encounter: Payer: Self-pay | Admitting: Registered Nurse

## 2020-12-17 ENCOUNTER — Telehealth (INDEPENDENT_AMBULATORY_CARE_PROVIDER_SITE_OTHER): Payer: Medicare HMO | Admitting: Registered Nurse

## 2020-12-17 DIAGNOSIS — R1013 Epigastric pain: Secondary | ICD-10-CM | POA: Diagnosis not present

## 2020-12-17 DIAGNOSIS — R110 Nausea: Secondary | ICD-10-CM | POA: Diagnosis not present

## 2020-12-17 MED ORDER — PANTOPRAZOLE SODIUM 40 MG PO TBEC: 40.0000 mg | DELAYED_RELEASE_TABLET | Freq: Every day | ORAL | 0 refills | Status: DC

## 2020-12-17 MED ORDER — ONDANSETRON HCL 4 MG PO TABS: 4.0000 mg | ORAL_TABLET | Freq: Three times a day (TID) | ORAL | 0 refills | Status: DC | PRN

## 2020-12-17 NOTE — Patient Instructions (Signed)
° ° ° °  If you have lab work done today you will be contacted with your lab results within the next 2 weeks.  If you have not heard from us then please contact us. The fastest way to get your results is to register for My Chart. ° ° °IF you received an x-ray today, you will receive an invoice from [facility]. Please contact [facility] at [phone] with questions or concerns regarding your invoice.  ° °IF you received labwork today, you will receive an invoice from [facility]. Please contact [facility] at [phone] with questions or concerns regarding your invoice.  ° °Our billing staff will not be able to assist you with questions regarding bills from these companies. ° °You will be contacted with the lab results as soon as they are available. The fastest way to get your results is to activate your My Chart account. Instructions are located on the last page of this paperwork. If you have not heard from us regarding the results in 2 weeks, please contact this office. °  ° ° ° °

## 2020-12-17 NOTE — Progress Notes (Signed)
Telemedicine Encounter- SOAP NOTE Established Patient  This telephone encounter was conducted with the patient's (or proxy's) verbal consent via audio telecommunications: yes/no: Yes Patient was instructed to have this encounter in a suitably private space; and to only have persons present to whom they give permission to participate. In addition, patient identity was confirmed by use of name plus two identifiers (DOB and address).  I discussed the limitations, risks, security and privacy concerns of performing an evaluation and management service by telephone and the availability of in person appointments. I also discussed with the patient that there may be a patient responsible charge related to this service. The patient expressed understanding and agreed to proceed.  I spent a total of 14 minutes talking with the patient or their proxy.  Patient at home Provider in office  Participants: Penny Sportsman, NP and Penny Butler  Chief Complaint  Patient presents with   Pain    Patient states since this morning she has been experiencing some stomach pain then is now moving to her back. She states she is bloated and is having  a hard time trying to even eat because she can not eat or sleep due to pain.    Subjective   Penny Butler is a 70 y.o. established patient. Telephone visit today for abdominal pain  HPI Onset this morning.  Woke up to the pain Stabbing pain in stomach Bloated, more flatus and belching No hx of this pain Slight nausea, no vomiting Appetite has returned, but easily sated Pain has subsided substantially, near resolved, some ongoing bloating. Denies melena and brbpr, vomiting, headache, fatigue, chills, fever, sweats, incontinence.  Recently increased trulicity from 0.75 to 1.5   Patient Active Problem List   Diagnosis Date Noted   Diabetes mellitus with proteinuric diabetic nephropathy ([facility]) 07/15/2020   Peripheral vascular disease, asymptomatic ([facility])  05/07/2020   Breast lump or mass 01/03/2020   Grief reaction 01/03/2020   Morbid obesity ([facility]) 04/23/2019   Obesity, diabetes, and hypertension syndrome ([facility]) 11/23/2018   Nocturnal leg cramps 11/07/2018   Personal history of kidney stones 03/11/2018   Encounter for preventive health examination 01/17/2018   GAD (generalized anxiety disorder) 12/31/2014   Menopause syndrome 08/11/2013   S/P TAH-BSO (total abdominal hysterectomy and bilateral salpingo-oophorectomy) 08/11/2013   OSA (obstructive sleep apnea)    Hyperlipidemia 06/01/2011   Hypertension 06/01/2011    Past Medical History:  Diagnosis Date   Hyperlipidemia    Hypertension    Obesity    OSA (obstructive sleep apnea)    now off of CAPAP without symptoms    Type 2 diabetes mellitus (HCC)     Current Outpatient Medications  Medication Sig Dispense Refill   Dulaglutide (TRULICITY) 1.5 MG/0.5ML SOPN Inject 1.5 mg into the skin once a week. 2 mL 2   glucose blood (ONETOUCH VERIO) test strip USE AS DIRECTED Once per   DAY 100 strip 5   hydrochlorothiazide (HYDRODIURIL) 25 MG tablet TAKE 1 TABLET (25 MG TOTAL) BY MOUTH DAILY. 90 tablet 0   OneTouch Delica Lancets 30G MISC USE AS DIRECTED EVERY DAY 100 each 5   rosuvastatin (CRESTOR) 5 MG tablet TAKE 1 TABLET BY MOUTH EVERY DAY 30 tablet 5   telmisartan (MICARDIS) 40 MG tablet Take 1 tablet (40 mg total) by mouth at bedtime. 90 tablet 1   glipiZIDE (GLUCOTROL) 5 MG tablet Take by mouth daily before breakfast. (Patient not taking: Reported on 11/04/2020)     No current facility-administered  medications for this visit.    Allergies  Allergen Reactions   Citalopram Other (See Comments)    Sick feeling and abdominal pain   Codeine Nausea Only    hallucinations    Social History   Socioeconomic History   Marital status: Married    Spouse name: Not on file   Number of children: Not on file   Years of education: Not on file   Highest education level: Not on file   Occupational History   Not on file  Tobacco Use   Smoking status: Never   Smokeless tobacco: Never  Vaping Use   Vaping Use: Former  Substance and Sexual Activity   Alcohol use: Not Currently    Comment: Few drinks a year   Drug use: No   Sexual activity: Yes  Other Topics Concern   Not on file  Social History Narrative   Not on file   Social Determinants of Health   Financial Resource Strain: Low Risk    Difficulty of Paying Living Expenses: Not hard at all  Food Insecurity: No Food Insecurity   Worried About Programme researcher, broadcasting/film/video in the Last Year: Never true   Ran Out of Food in the Last Year: Never true  Transportation Needs: No Transportation Needs   Lack of Transportation (Medical): No   Lack of Transportation (Non-Medical): No  Physical Activity: Sufficiently Active   Days of Exercise per Week: 7 days   Minutes of Exercise per Session: 30 min  Stress: No Stress Concern Present   Feeling of Stress : Only a little  Social Connections: Unknown   Frequency of Communication with Friends and Family: Not on file   Frequency of Social Gatherings with Friends and Family: Not on file   Attends Religious Services: Not on Scientist, clinical (histocompatibility and immunogenetics) or Organizations: Not on file   Attends Banker Meetings: Not on file   Marital Status: Married  Catering manager Violence: Not At Risk   Fear of Current or Ex-Partner: No   Emotionally Abused: No   Physically Abused: No   Sexually Abused: No    ROS Per hpi   Objective   Vitals as reported by the patient: There were no vitals filed for this visit.  Penny Butler was seen today for pain.  Diagnoses and all orders for this visit:  Epigastric pain -     pantoprazole (PROTONIX) 40 MG tablet; Take 1 tablet (40 mg total) by mouth daily. -     ondansetron (ZOFRAN) 4 MG tablet; Take 1 tablet (4 mg total) by mouth every 8 (eight) hours as needed for nausea or vomiting.  Nausea without vomiting -     pantoprazole  (PROTONIX) 40 MG tablet; Take 1 tablet (40 mg total) by mouth daily. -     ondansetron (ZOFRAN) 4 MG tablet; Take 1 tablet (4 mg total) by mouth every 8 (eight) hours as needed for nausea or vomiting.  PLAN Seems most likely trulicity effect given recent dose change, early satiety, and no other clear instigating factors Abdominal pain and appetite improving Discussed return precautions with pt, who voiced understanding Pantoprazole for epigastric pain and zofran for nausea. Patient encouraged to call clinic with any questions, comments, or concerns.   I discussed the assessment and treatment plan with the patient. The patient was provided an opportunity to ask questions and all were answered. The patient agreed with the plan and demonstrated an understanding of the instructions.   The patient  was advised to call back or seek an in-person evaluation if the symptoms worsen or if the condition fails to improve as anticipated.  I provided 14 minutes of non-face-to-face time during this encounter.  Janeece Agee, NP

## 2020-12-30 ENCOUNTER — Ambulatory Visit (INDEPENDENT_AMBULATORY_CARE_PROVIDER_SITE_OTHER): Payer: Medicare HMO | Admitting: Pharmacist

## 2020-12-30 DIAGNOSIS — E1165 Type 2 diabetes mellitus with hyperglycemia: Secondary | ICD-10-CM

## 2020-12-30 DIAGNOSIS — E7800 Pure hypercholesterolemia, unspecified: Secondary | ICD-10-CM | POA: Diagnosis not present

## 2020-12-30 DIAGNOSIS — I10 Essential (primary) hypertension: Secondary | ICD-10-CM | POA: Diagnosis not present

## 2020-12-30 MED ORDER — TRULICITY 3 MG/0.5ML ~~LOC~~ SOAJ
3.0000 mg | SUBCUTANEOUS | 1 refills | Status: DC
Start: 2020-12-30 — End: 2021-01-27

## 2020-12-30 NOTE — Chronic Care Management (AMB) (Signed)
Chronic Care Management Pharmacy Note  12/30/2020 Name:  Penny Butler MRN:  [id number] DOB:  08/16/1950  Subjective: Penny Butler is an 70 y.o. year old female who is a primary patient of Derrel Nip, Aris Everts, MD.  The CCM team was consulted for assistance with disease management and care coordination needs.    Engaged with patient by telephone for follow up visit in response to provider referral for pharmacy case management and/or care coordination services.   Consent to Services:  The patient was given information about Chronic Care Management services, agreed to services, and gave verbal consent prior to initiation of services.  Please see initial visit note for detailed documentation.   Patient Care Team: Crecencio Mc, MD as PCP - General (Internal Medicine) De Hollingshead, RPH-CPP (Pharmacist)   Objective:  Lab Results  Component Value Date   CREATININE 0.92 08/22/2020   CREATININE 0.94 07/23/2020   CREATININE 0.79 07/14/2020    Lab Results  Component Value Date   HGBA1C 7.7 (H) 07/14/2020   Last diabetic Eye exam:  Lab Results  Component Value Date/Time   HMDIABEYEEXA No Retinopathy 06/12/2019 12:00 AM    Last diabetic Foot exam: No results found for: HMDIABFOOTEX      Component Value Date/Time   CHOL 147 07/14/2020 0931   CHOL 214 (H) 07/06/2019 1201   TRIG 156.0 (H) 07/14/2020 0931   HDL 40.90 07/14/2020 0931   HDL 40 07/06/2019 1201   CHOLHDL 4 07/14/2020 0931   VLDL 31.2 07/14/2020 0931   LDLCALC 75 07/14/2020 0931   LDLCALC 130 (H) 07/06/2019 1201   LDLDIRECT 66.0 11/22/2018 1007    Hepatic Function Latest Ref Rng & Units 08/22/2020 07/14/2020 03/03/2020  Total Protein 6.1 - 8.1 g/dL 6.6 6.5 6.6  Albumin 3.5 - 5.2 g/dL - 4.1 4.2  AST 10 - 35 U/L _0 ALT 6 - 29 U/L _1 Alk Phosphatase 39 - 117 U/L - 103 90  Total Bilirubin 0.2 - 1.2 mg/dL 0.3 0.4 0.4    Lab Results  Component Value Date/Time   TSH 1.87 11/22/2018 10:07  AM   TSH 2.61 01/16/2018 02:38 PM    CBC Latest Ref Rng & Units 03/08/2018 02/22/2018 12/30/2014  WBC 4.0 - 10.5 K/uL 8.2 12.8(H) 8.1  Hemoglobin 12.0 - 15.0 g/dL 13.1 13.5 13.8  Hematocrit 36.0 - 46.0 % 39.0 40.2 41.1  Platelets 150.0 - 400.0 K/uL 328.0 302.0 283.0    Lab Results  Component Value Date/Time   VD25OH 53.26 03/03/2020 08:41 AM   VD25OH 83 08/09/2013 12:02 PM    Clinical ASCVD: No  The 10-year ASCVD risk score Mikey Bussing DC Jr., et al., 2013) is: 25.4%   Values used to calculate the score:     Age: 70 years     Sex: Female     Is Non-Hispanic African American: Yes     Diabetic: Yes     Tobacco smoker: No     Systolic Blood Pressure: 921 mmHg     Is BP treated: Yes     HDL Cholesterol: 40.9 mg/dL     Total Cholesterol: 147 mg/dL    Social History   Tobacco Use  Smoking Status Never  Smokeless Tobacco Never   BP Readings from Last 3 Encounters:  08/22/20 (!) 152/88  07/23/20 (!) 152/84  03/05/20 138/80   Pulse Readings from Last 3 Encounters:  08/22/20 95  07/23/20 89  03/05/20 84   Wt  Readings from Last 3 Encounters:  08/22/20 195 lb 9.6 oz (88.7 kg)  07/28/20 193 lb (87.5 kg)  07/10/20 193 lb (87.5 kg)    Assessment: Review of patient past medical history, allergies, medications, health status, including review of consultants reports, laboratory and other test data, was performed as part of comprehensive evaluation and provision of chronic care management services.   SDOH:  (Social Determinants of Health) assessments and interventions performed:  SDOH Interventions    Flowsheet Row Most Recent Value  SDOH Interventions   Financial Strain Interventions Intervention Not Indicated       CCM Care Plan  Allergies  Allergen Reactions   Citalopram Other (See Comments)    Sick feeling and abdominal pain   Codeine Nausea Only    hallucinations    Medications Reviewed Today     Reviewed by De Hollingshead, RPH-CPP (Pharmacist) on 12/30/20 at  [phone]  Med List Status: <None>   Medication Order Taking? Sig Documenting Provider Last Dose Status Informant  Dulaglutide (TRULICITY) 1.5 EP/3.2RJ SOPN [id number] Yes Inject 1.5 mg into the skin once a week. Crecencio Mc, MD Taking Active   glipiZIDE (GLUCOTROL) 5 MG tablet [id number] No Take by mouth daily before breakfast.  Patient not taking: No sig reported   [provider] Not Taking Active   glucose blood (ONETOUCH VERIO) test strip [id number] Yes USE AS DIRECTED Once per   Archie Balboa, MD Taking Active   hydrochlorothiazide (HYDRODIURIL) 25 MG tablet [id number] Yes TAKE 1 TABLET (25 MG TOTAL) BY MOUTH DAILY. Crecencio Mc, MD Taking Active   ondansetron [facility]) 4 MG tablet [id number] No Take 1 tablet (4 mg total) by mouth every 8 (eight) hours as needed for nausea or vomiting.  Patient not taking: Reported on 12/30/2020   Maximiano Coss, NP Not Taking Active   OneTouch Delica Lancets 61Y MISC [id number]  USE AS DIRECTED EVERY DAY Crecencio Mc, MD  Active   pantoprazole (PROTONIX) 40 MG tablet [id number] Yes Take 1 tablet (40 mg total) by mouth daily. Maximiano Coss, NP Taking Active   rosuvastatin (CRESTOR) 5 MG tablet [id number] Yes TAKE 1 TABLET BY MOUTH EVERY DAY Crecencio Mc, MD Taking Active   telmisartan (MICARDIS) 40 MG tablet [id number] Yes Take 1 tablet (40 mg total) by mouth at bedtime. Crecencio Mc, MD Taking Active             Patient Active Problem List   Diagnosis Date Noted   Diabetes mellitus with proteinuric diabetic nephropathy ([place]) 07/15/2020   Peripheral vascular disease, asymptomatic ([place]) 05/07/2020   Breast lump or mass 01/03/2020   Grief reaction 01/03/2020   Morbid obesity ([place]) 04/23/2019   Obesity, diabetes, and hypertension syndrome ([place]) 11/23/2018   Nocturnal leg cramps 11/07/2018   Personal history of kidney stones 03/11/2018   Encounter for preventive health examination 01/17/2018   GAD (generalized anxiety disorder)  12/31/2014   Menopause syndrome 08/11/2013   S/P TAH-BSO (total abdominal hysterectomy and bilateral salpingo-oophorectomy) 08/11/2013   OSA (obstructive sleep apnea)    Hyperlipidemia 06/01/2011   Hypertension 06/01/2011    Immunization History  Administered Date(s) Administered   Janssen (J&J) SARS-COV-2 Vaccination 11/29/2019   PFIZER(Purple Top)SARS-COV-2 Vaccination 06/11/2020   Zoster Recombinat (Shingrix) 07/26/2018, 12/15/2018    Conditions to be addressed/monitored: HTN, HLD, and DMII  Care Plan : Medication Management  Updates made by De Hollingshead, RPH-CPP since 12/30/2020 12:00 AM     Problem: DM, Obesity, HLD, HTN  Long-Range Goal: Disease Progression Prevention   Start Date: 07/17/2020  This Visit's Progress: On track  Recent Progress: On track  Priority: High  Note:   Current Barriers:  Unable to achieve control of diabetes, weight   Pharmacist Clinical Goal(s):  Over the next 90 days, patient will achieve control of diabetes as evidenced by A1c  through collaboration with PharmD and provider.   Interventions: 1:1 collaboration with Crecencio Mc, MD regarding development and update of comprehensive plan of care as evidenced by provider attestation and co-signature Inter-disciplinary care team collaboration (see longitudinal plan of care) Comprehensive medication review performed; medication list updated in electronic medical record  Health Maintenance: Up to date on COVID vaccinations Due for foot exam, eye exam. Previously recommended scheduling.   Diabetes: Uncontrolled; current treatment: Trulicity 1.5 mg weekly x 4 weeks Did report symptoms of bloating, gas, epigastric pain. Had a virtual visit where she was prescribed ondansetron and pantoprazole. Has been taking both PRN Very disappointed that she is not losing weight on this medication.  Hx metformin: nausea, diarrhea  Hx Ozempic: bloating that was negatively impactful to quality of  life Current glucose readings: fasting: 160-180s Again reviewed that she did not tolerate Ozempic from a GI effect, and that we had previously discussed Trulicity was generally less potent. She asked today about Mounjaro. Discussed that at this time, the majority of Medicare plans have not yet picked up the medication for coverage. Discussed increasing Trulicity to 3 mg weekly. Patient amenable. Increase Trulicity to 3 mg weekly.   Hypertension: Generally controlled; current treatment: telmisartan 40 mg daily, HCTZ 25 mg daily Previously recommended to continue current regimen.   Hyperlipidemia: Controlled per last lipid panel; current treatment: rosuvastatin 5 mg daily  Previously recommended to continue current regimen at this time.   Patient Goals/Self-Care Activities Over the next 90 days, patient will:  - take medications as prescribed check glucose daily, document, and provide at future appointments check blood pressure periodically, document, and provide at future appointments  Follow Up Plan: Telephone follow up appointment with care management team member scheduled for: ~ 4 weeks      Medication Assistance: None required.  Patient affirms current coverage meets needs.  Patient's [id number] [address]Phone: [phone]: [phone] [id number] [place] [place]: [phone]: [phone] Follow Up:  Patient agrees to Care Plan and Follow-up.  Plan: Telephone follow up appointment with care management team member scheduled for:  ~ 4 weeks  Catie TrDarnelle MaffucciPharmD, BCStarkeCPWellmanlinical Pharmacist LeOccidental Petroleumt Bu[employer] [phone]

## 2020-12-30 NOTE — Patient Instructions (Signed)
Visit Information  PATIENT GOALS:  Goals Addressed               This Visit's Progress     Patient Stated     Medication Monitoring (pt-stated)        Patient Goals/Self-Care Activities Over the next 90 days, patient will:  - take medications as prescribed check glucose daily, document, and provide at future appointments check blood pressure periodically, document, and provide at future appointments          Patient verbalizes understanding of instructions provided today and agrees to view in MyChart.   Plan: Telephone follow up appointment with care management team member scheduled for:  4 weeks  Catie Leelan Rajewski, PharmD, BCACP, CPP Clinical Pharmacist [facility] at [place] [phone] 

## 2021-01-02 ENCOUNTER — Other Ambulatory Visit: Payer: Self-pay | Admitting: Internal Medicine

## 2021-01-02 ENCOUNTER — Ambulatory Visit: Payer: Medicare HMO | Admitting: Pharmacist

## 2021-01-02 DIAGNOSIS — E7800 Pure hypercholesterolemia, unspecified: Secondary | ICD-10-CM

## 2021-01-02 DIAGNOSIS — I10 Essential (primary) hypertension: Secondary | ICD-10-CM

## 2021-01-02 DIAGNOSIS — E1165 Type 2 diabetes mellitus with hyperglycemia: Secondary | ICD-10-CM

## 2021-01-02 NOTE — Patient Instructions (Signed)
Visit Information  PATIENT GOALS:  Goals Addressed               This Visit's Progress     Patient Stated     Medication Monitoring (pt-stated)        Patient Goals/Self-Care Activities Over the next 90 days, patient will:  - take medications as prescribed check glucose daily, document, and provide at future appointments check blood pressure periodically, document, and provide at future appointments          Patient verbalizes understanding of instructions provided today and agrees to view in MyChart.   Plan: Telephone follow up appointment with care management team member scheduled for:  4 weeks  Catie Jontavious Commons, PharmD, BCACP, CPP Clinical Pharmacist [facility] at [place] [phone] 

## 2021-01-02 NOTE — Chronic Care Management (AMB) (Signed)
Chronic Care Management Pharmacy Note  01/02/2021 Name:  Penny Butler MRN:  [id number] DOB:  [birth year]   Subjective: Penny Butler is an 70 y.o. year old female who is a primary patient of Derrel Nip, Aris Everts, MD.  The CCM team was consulted for assistance with disease management and care coordination needs.    Engaged with patient by telephone for  medication access concerns  in response to provider referral for pharmacy case management and/or care coordination services.   Consent to Services:  The patient was given information about Chronic Care Management services, agreed to services, and gave verbal consent prior to initiation of services.  Please see initial visit note for detailed documentation.   Patient Care Team: Crecencio Mc, MD as PCP - General (Internal Medicine) De Hollingshead, RPH-CPP (Pharmacist)  Objective:  Lab Results  Component Value Date   CREATININE 0.92 08/22/2020   CREATININE 0.94 07/23/2020   CREATININE 0.79 07/14/2020    Lab Results  Component Value Date   HGBA1C 7.7 (H) 07/14/2020   Last diabetic Eye exam:  Lab Results  Component Value Date/Time   HMDIABEYEEXA No Retinopathy 06/12/2019 12:00 AM    Last diabetic Foot exam: No results found for: HMDIABFOOTEX      Component Value Date/Time   CHOL 147 07/14/2020 0931   CHOL 214 (H) 07/06/2019 1201   TRIG 156.0 (H) 07/14/2020 0931   HDL 40.90 07/14/2020 0931   HDL 40 07/06/2019 1201   CHOLHDL 4 07/14/2020 0931   VLDL 31.2 07/14/2020 0931   LDLCALC 75 07/14/2020 0931   LDLCALC 130 (H) 07/06/2019 1201   LDLDIRECT 66.0 11/22/2018 1007    Hepatic Function Latest Ref Rng & Units 08/22/2020 07/14/2020 03/03/2020  Total Protein 6.1 - 8.1 g/dL 6.6 6.5 6.6  Albumin 3.5 - 5.2 g/dL - 4.1 4.2  AST 10 - 35 U/L 19 16 15   ALT 6 - 29 U/L 23 20 20   Alk Phosphatase 39 - 117 U/L - 103 90  Total Bilirubin 0.2 - 1.2 mg/dL 0.3 0.4 0.4    Lab Results  Component Value Date/Time   TSH 1.87  11/22/2018 10:07 AM   TSH 2.61 01/16/2018 02:38 PM    CBC Latest Ref Rng & Units 03/08/2018 02/22/2018 12/30/2014  WBC 4.0 - 10.5 K/uL 8.2 12.8(H) 8.1  Hemoglobin 12.0 - 15.0 g/dL 13.1 13.5 13.8  Hematocrit 36.0 - 46.0 % 39.0 40.2 41.1  Platelets 150.0 - 400.0 K/uL 328.0 302.0 283.0    Lab Results  Component Value Date/Time   VD25OH 53.26 03/03/2020 08:41 AM   VD25OH 83 08/09/2013 12:02 PM    Clinical ASCVD: No  The 10-year ASCVD risk score Mikey Bussing DC Jr., et al., 2013) is: 25.4%   Values used to calculate the score:     Age: 33 years     Sex: Female     Is Non-Hispanic African American: Yes     Diabetic: Yes     Tobacco smoker: No     Systolic Blood Pressure: 660 mmHg     Is BP treated: Yes     HDL Cholesterol: 40.9 mg/dL     Total Cholesterol: 147 mg/dL    Social History   Tobacco Use  Smoking Status Never  Smokeless Tobacco Never   BP Readings from Last 3 Encounters:  08/22/20 (!) 152/88  07/23/20 (!) 152/84  03/05/20 138/80   Pulse Readings from Last 3 Encounters:  08/22/20 95  07/23/20 89  03/05/20 84  Wt Readings from Last 3 Encounters:  08/22/20 195 lb 9.6 oz (88.7 kg)  07/28/20 193 lb (87.5 kg)  07/10/20 193 lb (87.5 kg)    Assessment: Review of patient past medical history, allergies, medications, health status, including review of consultants reports, laboratory and other test data, was performed as part of comprehensive evaluation and provision of chronic care management services.   SDOH:  (Social Determinants of Health) assessments and interventions performed:  SDOH Interventions    Flowsheet Row Most Recent Value  SDOH Interventions   Financial Strain Interventions Other (Comment)  [manufacturer assistance]       CCM Care Plan  Allergies  Allergen Reactions   Citalopram Other (See Comments)    Sick feeling and abdominal pain   Codeine Nausea Only    hallucinations    Medications Reviewed Today     Reviewed by De Hollingshead,  RPH-CPP (Pharmacist) on 12/30/20 at [phone]  Med List Status: <None>   Medication Order Taking? Sig Documenting Provider Last Dose Status Informant  Dulaglutide (TRULICITY) 1.5 XB/2.6OM SOPN [id number] Yes Inject 1.5 mg into the skin once a week. Crecencio Mc, MD Taking Active   glipiZIDE (GLUCOTROL) 5 MG tablet [id number] No Take by mouth daily before breakfast.  Patient not taking: No sig reported   [provider] Not Taking Active   glucose blood (ONETOUCH VERIO) test strip [id number] Yes USE AS DIRECTED Once per   Archie Balboa, MD Taking Active   hydrochlorothiazide (HYDRODIURIL) 25 MG tablet [id number] Yes TAKE 1 TABLET (25 MG TOTAL) BY MOUTH DAILY. Crecencio Mc, MD Taking Active   ondansetron [facility]) 4 MG tablet [id number] No Take 1 tablet (4 mg total) by mouth every 8 (eight) hours as needed for nausea or vomiting.  Patient not taking: Reported on 12/30/2020   Maximiano Coss, NP Not Taking Active   OneTouch Delica Lancets 88K MISC [id number]  USE AS DIRECTED EVERY DAY Crecencio Mc, MD  Active   pantoprazole (PROTONIX) 40 MG tablet [id number] Yes Take 1 tablet (40 mg total) by mouth daily. Maximiano Coss, NP Taking Active   rosuvastatin (CRESTOR) 5 MG tablet [id number] Yes TAKE 1 TABLET BY MOUTH EVERY DAY Crecencio Mc, MD Taking Active   telmisartan (MICARDIS) 40 MG tablet [id number] Yes Take 1 tablet (40 mg total) by mouth at bedtime. Crecencio Mc, MD Taking Active             Patient Active Problem List   Diagnosis Date Noted   Diabetes mellitus with proteinuric diabetic nephropathy ([place]) 07/15/2020   Peripheral vascular disease, asymptomatic ([place]) 05/07/2020   Breast lump or mass 01/03/2020   Grief reaction 01/03/2020   Morbid obesity ([place]) 04/23/2019   Obesity, diabetes, and hypertension syndrome ([place]) 11/23/2018   Nocturnal leg cramps 11/07/2018   Personal history of kidney stones 03/11/2018   Encounter for preventive health examination 01/17/2018    GAD (generalized anxiety disorder) 12/31/2014   Menopause syndrome 08/11/2013   S/P TAH-BSO (total abdominal hysterectomy and bilateral salpingo-oophorectomy) 08/11/2013   OSA (obstructive sleep apnea)    Hyperlipidemia 06/01/2011   Hypertension 06/01/2011    Immunization History  Administered Date(s) Administered   Janssen (J&J) SARS-COV-2 Vaccination 11/29/2019   PFIZER(Purple Top)SARS-COV-2 Vaccination 06/11/2020   Zoster Recombinat (Shingrix) 07/26/2018, 12/15/2018    Conditions to be addressed/monitored: HTN, HLD, and DMII  Care Plan : Medication Management  Updates made by De Hollingshead, RPH-CPP since 01/02/2021 12:00 AM     Problem: DM,  Obesity, HLD, HTN      Long-Range Goal: Disease Progression Prevention   Start Date: 07/17/2020  This Visit's Progress: On track  Recent Progress: On track  Priority: High  Note:   Current Barriers:  Unable to achieve control of diabetes, weight   Pharmacist Clinical Goal(s):  Over the next 90 days, patient will achieve control of diabetes as evidenced by A1c  through collaboration with PharmD and provider.   Interventions: 1:1 collaboration with Crecencio Mc, MD regarding development and update of comprehensive plan of care as evidenced by provider attestation and co-signature Inter-disciplinary care team collaboration (see longitudinal plan of care) Comprehensive medication review performed; medication list updated in electronic medical record  Health Maintenance: Up to date on COVID vaccinations Due for foot exam, eye exam. Previously recommended scheduling.   Diabetes: Uncontrolled; current treatment: Trulicity 3 mg Received fax from insurance regarding coverage of Mounjaro. States that Darcel Bayley is non-formulary for her plan. Preferred alternatives include Byetta, Victoza, Ozempic, Bydureon, Ozempic, Rybelsus, Trulicity. Patient must have investigated coverage with her insurance, as we did not send a prescription.   [facility]  Hx metformin: nausea, diarrhea  Hx Ozempic: bloating that was negatively impactful to quality of life [facility] The medication is on order and should arrive today. Called patient to clarify what the issue was. She notes that she is in the Medicare Coverage Gap and the cost is >$140. Discussed patient assistance, she notes she would qualify for Trulicity assistance. She will come by the office today and sign patient assistance application, will collaborate with patient and provider to complete application and submit.   Hypertension: Generally controlled; current treatment: telmisartan 40 mg daily, HCTZ 25 mg daily Previously recommended to continue current regimen.   Hyperlipidemia: Controlled per last lipid panel; current treatment: rosuvastatin 5 mg daily  Previously recommended to continue current regimen at this time.   Patient Goals/Self-Care Activities Over the next 90 days, patient will:  - take medications as prescribed check glucose daily, document, and provide at future appointments check blood pressure periodically, document, and provide at future appointments  Follow Up Plan: Telephone follow up appointment with care management team member scheduled for: ~ 4 weeks      Medication Assistance: Application for Trulicity  medication assistance program. in process.  Anticipated assistance start date TBD.  See plan of care for additional detail.  Patient's preferred pharmacy [facility]  [id number] [address] [place]Phone: [phone]: [phone] [id number] [place]: [phone]: [phone] Follow Up:  Patient agrees to Care Plan and Follow-up.  Plan: Telephone follow up appointment with care management team member scheduled for:  ~ 4 weeks  Catie  TrDarnelle MaffucciPharmD, BCBurgawCPWest Pittstonlinical Pharmacist LeOccidental Pet[employer]t BuJohnson [employer] [phone]

## 2021-01-03 ENCOUNTER — Other Ambulatory Visit: Payer: Self-pay | Admitting: Internal Medicine

## 2021-01-07 NOTE — Telephone Encounter (Signed)
PT called to advise that she sent a mychart message about her Trulicity. She stated that its been making her blood sugar come down, feel bloated as well as making her clothes not fit as well. She wants to know if she can have it switch to the [facility] and if patient assistance will cover it as well that Catie has been helping her with. She really wants to talk to Dr.Tullo asap in regards to this as she does not like this and feels miserable. Talked to Shanda Bumps who advise she would send this back and advised PT that Shanda Bumps would call asap to assist.

## 2021-01-09 ENCOUNTER — Telehealth: Payer: Self-pay | Admitting: Pharmacy Technician

## 2021-01-09 DIAGNOSIS — Z596 Low income: Secondary | ICD-10-CM

## 2021-01-09 MED ORDER — METFORMIN HCL ER 500 MG PO TB24: 1000.0000 mg | ORAL_TABLET | Freq: Every day | ORAL | 1 refills | Status: DC

## 2021-01-09 NOTE — Progress Notes (Signed)
[facility] Customer service manager [facility])                                            [facility] Quality Pharmacy Team    01/09/2021  Penny Butler 1951/02/16 [id number]                                      Medication Assistance Referral  Referral From: [facility] Embedded RPh Penny T.   Medication/Company: Trulicity 3mg  / [employer] Patient application portion:  N/A Embedded PharmD had signed on 01/02/21 Provider application portion:  N/A Embedded PharmD had signed  to Dr. 01/04/21 on 01/02/21 Provider address/fax verified via: Office website  Received both patient and provider portion(s) of patient assistance application(s) for Trulicity. Faxed completed application and required documents into [employer] on 01/02/21.  Care coordination call placed to [facility] in regards to determination status. Spoke to Penny Butler who informed patient was APPROVED 01/04/21-06/20/21. Penny Butler transferred me to [facility] for shipment status. Spoke to Penny Butler at [employer] who informed that a 4 month supply of medication is scheduled to be delivered to the patient on 01/16/21.  Successful outreach call placed to patient, HIPAA verified. Informed patient of her enrollment into the program and when to expect delivery. Patient informed that she just communicated with the office yesterday that she was not going to be able to take Trulicity. She informed she was going to leave embedded PharmD a message about this as well. Communicated to patient that embedded PharmD was out of office but would be back next week. Patient verbalized understanding.  Care coordination call placed back to [facility]. Spoke to 01/18/21. Penny Butler was able to cancel the order scheduled for 01/16/21. She informed to have patient call [facility] about 10 business days before a refill is needed to schedule a delivery at [phone].  Will send note to embedded PharmD Penny [phone].  Penny Butler, CPhT [facility] [place] [place]   [phone]

## 2021-01-09 NOTE — Telephone Encounter (Signed)
Stopping trulicity per patient request   Metformin resumed  MyChart message sent

## 2021-01-13 ENCOUNTER — Ambulatory Visit
Admission: RE | Admit: 2021-01-13 | Discharge: 2021-01-13 | Disposition: A | Discharge status: Home / Self Care | Payer: Medicare HMO | Source: Ambulatory Visit | Attending: Internal Medicine | Admitting: Internal Medicine

## 2021-01-13 ENCOUNTER — Other Ambulatory Visit: Payer: Self-pay

## 2021-01-13 DIAGNOSIS — Z1231 Encounter for screening mammogram for malignant neoplasm of breast: Secondary | ICD-10-CM | POA: Insufficient documentation

## 2021-01-14 DIAGNOSIS — I10 Essential (primary) hypertension: Secondary | ICD-10-CM | POA: Diagnosis not present

## 2021-01-14 DIAGNOSIS — G4733 Obstructive sleep apnea (adult) (pediatric): Secondary | ICD-10-CM | POA: Diagnosis not present

## 2021-01-27 ENCOUNTER — Ambulatory Visit (INDEPENDENT_AMBULATORY_CARE_PROVIDER_SITE_OTHER): Payer: Medicare HMO | Admitting: Pharmacist

## 2021-01-27 DIAGNOSIS — E1165 Type 2 diabetes mellitus with hyperglycemia: Secondary | ICD-10-CM | POA: Diagnosis not present

## 2021-01-27 DIAGNOSIS — I10 Essential (primary) hypertension: Secondary | ICD-10-CM | POA: Diagnosis not present

## 2021-01-27 DIAGNOSIS — E7800 Pure hypercholesterolemia, unspecified: Secondary | ICD-10-CM | POA: Diagnosis not present

## 2021-01-27 NOTE — Chronic Care Management (AMB) (Signed)
Chronic Care Management Pharmacy Note  01/27/2021 Name:  Penny Butler MRN:  [id number] DOB:  03/13/1951   Subjective: Penny Butler is an 70 y.o. year old female who is a primary patient of Derrel Nip, Aris Everts, MD.  The CCM team was consulted for assistance with disease management and care coordination needs.    Engaged with patient by telephone for follow up visit in response to provider referral for pharmacy case management and/or care coordination services.   Consent to Services:  The patient was given information about Chronic Care Management services, agreed to services, and gave verbal consent prior to initiation of services.  Please see initial visit note for detailed documentation.   Patient Care Team: Crecencio Mc, MD as PCP - General (Internal Medicine) De Hollingshead, RPH-CPP (Pharmacist)  Objective:  Lab Results  Component Value Date   CREATININE 0.92 08/22/2020   CREATININE 0.94 07/23/2020   CREATININE 0.79 07/14/2020    Lab Results  Component Value Date   HGBA1C 7.7 (H) 07/14/2020   Last diabetic Eye exam:  Lab Results  Component Value Date/Time   HMDIABEYEEXA No Retinopathy 06/12/2019 12:00 AM    Last diabetic Foot exam: No results found for: HMDIABFOOTEX      Component Value Date/Time   CHOL 147 07/14/2020 0931   CHOL 214 (H) 07/06/2019 1201   TRIG 156.0 (H) 07/14/2020 0931   HDL 40.90 07/14/2020 0931   HDL 40 07/06/2019 1201   CHOLHDL 4 07/14/2020 0931   VLDL 31.2 07/14/2020 0931   LDLCALC 75 07/14/2020 0931   LDLCALC 130 (H) 07/06/2019 1201   LDLDIRECT 66.0 11/22/2018 1007    Hepatic Function Latest Ref Rng & Units 08/22/2020 07/14/2020 03/03/2020  Total Protein 6.1 - 8.1 g/dL 6.6 6.5 6.6  Albumin 3.5 - 5.2 g/dL - 4.1 4.2  AST 10 - 35 U/L _0 ALT 6 - 29 U/L _1 Alk Phosphatase 39 - 117 U/L - 103 90  Total Bilirubin 0.2 - 1.2 mg/dL 0.3 0.4 0.4    Lab Results  Component Value Date/Time   TSH 1.87 11/22/2018 10:07 AM    TSH 2.61 01/16/2018 02:38 PM    CBC Latest Ref Rng & Units 03/08/2018 02/22/2018 12/30/2014  WBC 4.0 - 10.5 K/uL 8.2 12.8(H) 8.1  Hemoglobin 12.0 - 15.0 g/dL 13.1 13.5 13.8  Hematocrit 36.0 - 46.0 % 39.0 40.2 41.1  Platelets 150.0 - 400.0 K/uL 328.0 302.0 283.0    Lab Results  Component Value Date/Time   VD25OH 53.26 03/03/2020 08:41 AM   VD25OH 83 08/09/2013 12:02 PM    Clinical ASCVD: No  The 10-year ASCVD risk score Mikey Bussing DC Jr., et al., 2013) is: 25.4%   Values used to calculate the score:     Age: 18 years     Sex: Female     Is Non-Hispanic African American: Yes     Diabetic: Yes     Tobacco smoker: No     Systolic Blood Pressure: 017 mmHg     Is BP treated: Yes     HDL Cholesterol: 40.9 mg/dL     Total Cholesterol: 147 mg/dL      Social History   Tobacco Use  Smoking Status Never  Smokeless Tobacco Never   BP Readings from Last 3 Encounters:  08/22/20 (!) 152/88  07/23/20 (!) 152/84  03/05/20 138/80   Pulse Readings from Last 3 Encounters:  08/22/20 95  07/23/20 89  03/05/20 84  Wt Readings from Last 3 Encounters:  08/22/20 195 lb 9.6 oz (88.7 kg)  07/28/20 193 lb (87.5 kg)  07/10/20 193 lb (87.5 kg)    Assessment: Review of patient past medical history, allergies, medications, health status, including review of consultants reports, laboratory and other test data, was performed as part of comprehensive evaluation and provision of chronic care management services.   SDOH:  (Social Determinants of Health) assessments and interventions performed:  SDOH Interventions    Flowsheet Row Most Recent Value  SDOH Interventions   Financial Strain Interventions Intervention Not Indicated       CCM Care Plan  Allergies  Allergen Reactions   Citalopram Other (See Comments)    Sick feeling and abdominal pain   Codeine Nausea Only    hallucinations    Medications Reviewed Today     Reviewed by De Hollingshead, RPH-CPP (Pharmacist) on 01/27/21 at  1430  Med List Status: <None>   Medication Order Taking? Sig Documenting Provider Last Dose Status Informant  glucose blood (ONETOUCH VERIO) test strip [id number]  USE AS DIRECTED Once per   Archie Balboa, MD  Active   hydrochlorothiazide (HYDRODIURIL) 25 MG tablet [id number] Yes TAKE 1 TABLET (25 MG TOTAL) BY MOUTH DAILY. Crecencio Mc, MD Taking Active   metFORMIN (GLUCOPHAGE XR) 500 MG 24 hr tablet [id number] Yes Take 2 tablets (1,000 mg total) by mouth daily with breakfast. Crecencio Mc, MD Taking Active            Med Note ([place] Jan 27, 2021  2:24 PM) Taking 500 mg BID  ondansetron (ZOFRAN) 4 MG tablet [id number] No Take 1 tablet (4 mg total) by mouth every 8 (eight) hours as needed for nausea or vomiting.  Patient not taking: No sig reported   Maximiano Coss, NP Not Taking Active   OneTouch Delica Lancets 96P MISC [id number] Yes USE AS DIRECTED EVERY DAY Crecencio Mc, MD Taking Active   pantoprazole (PROTONIX) 40 MG tablet [id number] Yes Take 1 tablet (40 mg total) by mouth daily. Maximiano Coss, NP Taking Active   rosuvastatin (CRESTOR) 5 MG tablet [id number] Yes TAKE 1 TABLET BY MOUTH EVERY DAY Crecencio Mc, MD Taking Active   telmisartan (MICARDIS) 40 MG tablet [id number] Yes Take 1 tablet (40 mg total) by mouth at bedtime. Crecencio Mc, MD Taking Active             Patient Active Problem List   Diagnosis Date Noted   Diabetes mellitus with proteinuric diabetic nephropathy ([place]) 07/15/2020   Peripheral vascular disease, asymptomatic ([place]) 05/07/2020   Breast lump or mass 01/03/2020   Grief reaction 01/03/2020   Morbid obesity ([place]) 04/23/2019   Obesity, diabetes, and hypertension syndrome ([place]) 11/23/2018   Nocturnal leg cramps 11/07/2018   Personal history of kidney stones 03/11/2018   Encounter for preventive health examination 01/17/2018   GAD (generalized anxiety disorder) 12/31/2014   Menopause syndrome 08/11/2013   S/P TAH-BSO (total  abdominal hysterectomy and bilateral salpingo-oophorectomy) 08/11/2013   OSA (obstructive sleep apnea)    Hyperlipidemia 06/01/2011   Hypertension 06/01/2011    Immunization History  Administered Date(s) Administered   Janssen (J&J) SARS-COV-2 Vaccination 11/29/2019   PFIZER(Purple Top)SARS-COV-2 Vaccination 06/11/2020   Zoster Recombinat (Shingrix) 07/26/2018, 12/15/2018    Conditions to be addressed/monitored: HTN, HLD, and DMII  Care Plan : Medication Management  Updates made by De Hollingshead, RPH-CPP since 01/27/2021 12:00 AM     Problem:  DM, Obesity, HLD, HTN      Long-Range Goal: Disease Progression Prevention   Start Date: 07/17/2020  This Visit's Progress: On track  Recent Progress: On track  Priority: High  Note:   Current Barriers:  Unable to achieve control of diabetes, weight   Pharmacist Clinical Goal(s):  Over the next 90 days, patient will achieve control of diabetes as evidenced by A1c  through collaboration with PharmD and provider.   Interventions: 1:1 collaboration with Crecencio Mc, MD regarding development and update of comprehensive plan of care as evidenced by provider attestation and co-signature Inter-disciplinary care team collaboration (see longitudinal plan of care) Comprehensive medication review performed; medication list updated in electronic medical record  Health Maintenance: Up to date on COVID vaccinations Due for foot exam, eye exam. Previously recommended scheduling.   Diabetes: Uncontrolled; current treatment: metformin XR 500 mg BID She stopped glipizide due to symptoms of hypoglycemia.  Previously fax from insurance regarding coverage of Mounjaro. States that Darcel Bayley is non-formulary for her plan. Preferred alternatives include Byetta, Victoza, Bydureon, Ozempic, Rybelsus, Trulicity. Patient must have investigated coverage with her insurance, as we did not send a prescription.  Hx metformin: nausea, diarrhea  Hx Ozempic,  Trulicity: bloating that was negatively impactful to quality of life Hx glipizide: low blood sugars Also reports she tried "natural"  Current glucose readings: fasting: 130-140s; post prandial: 160-180s Current meal patterns: breakfast: piece of low carb toast, eggs, [place] bacon; sometimes watermelon; some days peanut butter on toast, protein bar, smoothie; lunch: lots of subway, today she had a cucumber and avocado; supper: grilled chicken, vegetables, cauliflower rice; sometimes a meat and vegetables; craves sweets.  Current exercise: walks 30-45 minutes daily Extensive dietary discussion. Discussed need to reduce carbohydrate intake and increase physical activity intensity, if she feels she is at a plateau Patient continues to be frustrated by weight gain and bloating. Feels like she has hormonal/cyclical cravings and snacks throughout the day. Struggles with the will power to avoid snacking at home. Wonders if she needs to see a GYN or someone who specializes in [employer] health. Plans to discuss with PCP at next appointment.  Discussed potential to increase metformin moving forward. Patient declines, does not want to be on more medication.  Asked about Saxenda. We discussed that I would anticipate that Holley Raring, any other GLP1 acting agents would lead to the same intolerable side effects she experienced on Ozempic and Trulicity, as the side effects she experienced were related to the appetite suppression that leads to weight loss.   Hypertension: Generally controlled; current treatment: telmisartan 40 mg daily, HCTZ 25 mg daily Previously recommended to continue current regimen.   Hyperlipidemia: Controlled per last lipid panel; current treatment: rosuvastatin 5 mg daily  Previously recommended to continue current regimen at this time.   Patient Goals/Self-Care Activities Over the next 90 days, patient will:  - take medications as prescribed check glucose daily, document, and  provide at future appointments check blood pressure periodically, document, and provide at future appointments  Follow Up Plan: Telephone follow up appointment with care management team member scheduled for: ~ 12 weeks      Medication Assistance: None required.  Patient affirms current coverage meets needs.  Patient's preferred pharmacy is:  [id number] [place] [address]Phone: [phone]: [phone] [id number] [place], [place]: [phone]: [phone]  Follow Up:  Patient agrees to Care Plan and Follow-up.  Plan: Telephone follow up appointment with care management team member scheduled for:  ~ 12 weeks  Catie Darnelle Maffucci, PharmD, [place], [place] Clinical Pharmacist [employer] at [employer] [phone]

## 2021-01-27 NOTE — Patient Instructions (Signed)
Visit Information  PATIENT GOALS:  Goals Addressed               This Visit's Progress     Patient Stated     Medication Monitoring (pt-stated)        Patient Goals/Self-Care Activities Over the next 90 days, patient will:  - take medications as prescribed check glucose daily, document, and provide at future appointments check blood pressure periodically, document, and provide at future appointments        Patient verbalizes understanding of instructions provided today and agrees to view in MyChart.   Plan: Telephone follow up appointment with care management team member scheduled for:  ~ 12 weeks  Catie Feliz Beam, PharmD, [place], CPP Clinical Pharmacist [employer] at [employer] [phone]

## 2021-02-04 DIAGNOSIS — N951 Menopausal and female climacteric states: Secondary | ICD-10-CM | POA: Diagnosis not present

## 2021-02-04 DIAGNOSIS — I10 Essential (primary) hypertension: Secondary | ICD-10-CM | POA: Diagnosis not present

## 2021-02-04 DIAGNOSIS — E1165 Type 2 diabetes mellitus with hyperglycemia: Secondary | ICD-10-CM | POA: Diagnosis not present

## 2021-02-04 DIAGNOSIS — R635 Abnormal weight gain: Secondary | ICD-10-CM | POA: Diagnosis not present

## 2021-02-05 DIAGNOSIS — I10 Essential (primary) hypertension: Secondary | ICD-10-CM | POA: Diagnosis not present

## 2021-02-05 DIAGNOSIS — G4733 Obstructive sleep apnea (adult) (pediatric): Secondary | ICD-10-CM | POA: Diagnosis not present

## 2021-02-05 DIAGNOSIS — E119 Type 2 diabetes mellitus without complications: Secondary | ICD-10-CM | POA: Diagnosis not present

## 2021-02-05 DIAGNOSIS — Z7984 Long term (current) use of oral hypoglycemic drugs: Secondary | ICD-10-CM | POA: Diagnosis not present

## 2021-02-05 DIAGNOSIS — E785 Hyperlipidemia, unspecified: Secondary | ICD-10-CM | POA: Diagnosis not present

## 2021-02-05 DIAGNOSIS — Z885 Allergy status to narcotic agent status: Secondary | ICD-10-CM | POA: Diagnosis not present

## 2021-02-05 LAB — BASIC METABOLIC PANEL
BUN: 24 — AB (ref 4–21)
CO2: 27 — AB (ref 13–22)
Chloride: 105 (ref 99–108)
Creatinine: 1.1 (ref 0.5–1.1)
Glucose: 205
Potassium: 4.2 (ref 3.4–5.3)
Sodium: 141 (ref 137–147)

## 2021-02-05 LAB — HEPATIC FUNCTION PANEL
ALT: 31 (ref 7–35)
AST: 24 (ref 13–35)
Alkaline Phosphatase: 73 (ref 25–125)
Bilirubin, Total: 0.4

## 2021-02-05 LAB — VITAMIN D 25 HYDROXY (VIT D DEFICIENCY, FRACTURES): Vit D, 25-Hydroxy: 53

## 2021-02-05 LAB — LIPID PANEL
Cholesterol: 179 (ref 0–200)
HDL: 42 (ref 35–70)
LDL Cholesterol: 108
Triglycerides: 146 (ref 40–160)

## 2021-02-05 LAB — CBC AND DIFFERENTIAL
HCT: 41 (ref 36–46)
Hemoglobin: 13.6 (ref 12.0–16.0)
Platelets: 295 (ref 150–399)
WBC: 6.7

## 2021-02-05 LAB — IRON,TIBC AND FERRITIN PANEL: Ferritin: 186

## 2021-02-05 LAB — TSH: TSH: 1.77 (ref 0.41–5.90)

## 2021-02-05 LAB — TESTOSTERONE: Testosterone: 14

## 2021-02-05 LAB — COMPREHENSIVE METABOLIC PANEL: Calcium: 10.7 (ref 8.7–10.7)

## 2021-02-05 LAB — HEMOGLOBIN A1C: Hemoglobin A1C: 6.6

## 2021-02-09 DIAGNOSIS — Z6838 Body mass index (BMI) 38.0-38.9, adult: Secondary | ICD-10-CM | POA: Diagnosis not present

## 2021-02-09 DIAGNOSIS — N943 Premenstrual tension syndrome: Secondary | ICD-10-CM | POA: Diagnosis not present

## 2021-02-09 DIAGNOSIS — Z1339 Encounter for screening examination for other mental health and behavioral disorders: Secondary | ICD-10-CM | POA: Diagnosis not present

## 2021-02-09 DIAGNOSIS — E1165 Type 2 diabetes mellitus with hyperglycemia: Secondary | ICD-10-CM | POA: Diagnosis not present

## 2021-02-09 DIAGNOSIS — Z1331 Encounter for screening for depression: Secondary | ICD-10-CM | POA: Diagnosis not present

## 2021-02-09 DIAGNOSIS — N951 Menopausal and female climacteric states: Secondary | ICD-10-CM | POA: Diagnosis not present

## 2021-02-09 DIAGNOSIS — I10 Essential (primary) hypertension: Secondary | ICD-10-CM | POA: Diagnosis not present

## 2021-02-10 LAB — HM DIABETES EYE EXAM

## 2021-02-11 DIAGNOSIS — H2513 Age-related nuclear cataract, bilateral: Secondary | ICD-10-CM | POA: Diagnosis not present

## 2021-02-11 DIAGNOSIS — E119 Type 2 diabetes mellitus without complications: Secondary | ICD-10-CM | POA: Diagnosis not present

## 2021-02-11 DIAGNOSIS — H25019 Cortical age-related cataract, unspecified eye: Secondary | ICD-10-CM | POA: Diagnosis not present

## 2021-02-14 DIAGNOSIS — I10 Essential (primary) hypertension: Secondary | ICD-10-CM | POA: Diagnosis not present

## 2021-02-14 DIAGNOSIS — G4733 Obstructive sleep apnea (adult) (pediatric): Secondary | ICD-10-CM | POA: Diagnosis not present

## 2021-03-03 DIAGNOSIS — N951 Menopausal and female climacteric states: Secondary | ICD-10-CM | POA: Diagnosis not present

## 2021-03-05 ENCOUNTER — Ambulatory Visit (INDEPENDENT_AMBULATORY_CARE_PROVIDER_SITE_OTHER): Payer: Medicare HMO | Admitting: Internal Medicine

## 2021-03-05 ENCOUNTER — Other Ambulatory Visit: Payer: Self-pay

## 2021-03-05 DIAGNOSIS — E785 Hyperlipidemia, unspecified: Secondary | ICD-10-CM | POA: Diagnosis not present

## 2021-03-05 DIAGNOSIS — E669 Obesity, unspecified: Secondary | ICD-10-CM | POA: Diagnosis not present

## 2021-03-05 DIAGNOSIS — E1159 Type 2 diabetes mellitus with other circulatory complications: Secondary | ICD-10-CM | POA: Diagnosis not present

## 2021-03-05 DIAGNOSIS — E1165 Type 2 diabetes mellitus with hyperglycemia: Secondary | ICD-10-CM | POA: Diagnosis not present

## 2021-03-05 DIAGNOSIS — I152 Hypertension secondary to endocrine disorders: Secondary | ICD-10-CM | POA: Diagnosis not present

## 2021-03-05 DIAGNOSIS — N898 Other specified noninflammatory disorders of vagina: Secondary | ICD-10-CM | POA: Diagnosis not present

## 2021-03-05 DIAGNOSIS — N951 Menopausal and female climacteric states: Secondary | ICD-10-CM

## 2021-03-05 DIAGNOSIS — I10 Essential (primary) hypertension: Secondary | ICD-10-CM

## 2021-03-05 DIAGNOSIS — E1169 Type 2 diabetes mellitus with other specified complication: Secondary | ICD-10-CM

## 2021-03-05 DIAGNOSIS — Z6838 Body mass index (BMI) 38.0-38.9, adult: Secondary | ICD-10-CM | POA: Diagnosis not present

## 2021-03-05 DIAGNOSIS — R232 Flushing: Secondary | ICD-10-CM | POA: Diagnosis not present

## 2021-03-05 LAB — HM DIABETES FOOT EXAM

## 2021-03-05 MED ORDER — GLIPIZIDE 5 MG PO TABS: 2.5000 mg | ORAL_TABLET | Freq: Every day | ORAL | 2 refills | Status: DC

## 2021-03-05 NOTE — Assessment & Plan Note (Addendum)
a1c has improved with better diet. Currently taking only metformin xr.  Intolerant f GLP 1 agonists due to bloating.  Adding back 2.5 mg IR glipizde at dinner time for morning hyperglycemia.  Foot exam done today

## 2021-03-05 NOTE — Assessment & Plan Note (Signed)
With micralbuminuria.  Continue telmisartan and hctz

## 2021-03-05 NOTE — Assessment & Plan Note (Signed)
Managed with rosuvastatin  Lab Results  Component Value Date   CHOL 179 02/05/2021   HDL 42 02/05/2021   LDLCALC 108 02/05/2021   LDLDIRECT 66.0 11/22/2018   TRIG 146 02/05/2021   CHOLHDL 4 07/14/2020

## 2021-03-05 NOTE — Progress Notes (Signed)
Subjective:  Patient ID: Penny Butler, female    DOB: 1951-03-08  Age: 70 y.o. MRN: [id number]  CC: The primary encounter diagnosis was Hypercalcemia. Diagnoses of Hyperlipidemia associated with type 2 diabetes mellitus (HCC), Primary hypertension, Menopause syndrome, and Obesity, diabetes, and hypertension syndrome (HCC) were also pertinent to this visit.  HPI Penny Butler presents for follow up on diabetes, obesity , hypertension Chief Complaint  Patient presents with   Follow-up   Diabetes   Hypertension   Hyperlipidemia   1) DM,obesity and hypertension:  did not tolerate ozempic or Trulicity  due to bloating,  intolerant of metformin at higher doses  but willing to continue at current dose.    Has been off of Trulicity for 6 weeks.  Bow[place] moving regularly .  Gaining weight due to "cyclical cravings " (described by patient as monthly episodes of feeling ravenous and fatigued) . Fastings are 184 in the morning.  Discussed adding back 2.5 mg glipzide before supper.  Seeing [facility] who has started HRT with with estrogen and testosterone pellet implantation one month ago when estradiol level was undetectable and testosterone was low.   2) taking crestor and LDL 108.    3) hypercalcemia:   new.  Taking an MVI.    5) HTN:   TAKING TELMISARTAN  HCTZ   Outpatient Medications Prior to Visit  Medication Sig Dispense Refill   glucose blood (ONETOUCH VERIO) test strip USE AS DIRECTED Once per   DAY 100 strip 5   hydrochlorothiazide (HYDRODIURIL) 25 MG tablet TAKE 1 TABLET (25 MG TOTAL) BY MOUTH DAILY. 90 tablet 0   metFORMIN (GLUCOPHAGE XR) 500 MG 24 hr tablet Take 2 tablets (1,000 mg total) by mouth daily with breakfast. 180 tablet 1   OneTouch Delica Lancets 30G MISC USE AS DIRECTED EVERY DAY 100 each 5   rosuvastatin (CRESTOR) 5 MG tablet TAKE 1 TABLET BY MOUTH EVERY DAY 90 tablet 1   telmisartan (MICARDIS) 40 MG tablet Take 1 tablet (40 mg total) by mouth at  bedtime. 90 tablet 1   ondansetron (ZOFRAN) 4 MG tablet Take 1 tablet (4 mg total) by mouth every 8 (eight) hours as needed for nausea or vomiting. 20 tablet 0   pantoprazole (PROTONIX) 40 MG tablet Take 1 tablet (40 mg total) by mouth daily. 20 tablet 0   No facility-administered medications prior to visit.    Review of Systems;  Patient denies headache, fevers, malaise, unintentional weight loss, skin rash, eye pain, sinus congestion and sinus pain, sore throat, dysphagia,  hemoptysis , cough, dyspnea, wheezing, chest pain, palpitations, orthopnea, edema, abdominal pain, nausea, melena, diarrhea, constipation, flank pain, dysuria, hematuria, urinary  Frequency, nocturia, numbness, tingling, seizures,  Focal weakness, Loss of consciousness,  Tremor, insomnia, depression, anxiety, and suicidal ideation.      Objective:  BP 138/82   Pulse 86   Temp 97.8 F (36.6 C) (Skin)   Ht 5\' 1"  (1.549 m)   Wt 200 lb 3.2 oz (90.8 kg)   SpO2 99%   BMI 37.83 kg/m   BP Readings from Last 3 Encounters:  03/05/21 138/82  08/22/20 (!) 152/88  07/23/20 (!) 152/84    Wt Readings from Last 3 Encounters:  03/05/21 200 lb 3.2 oz (90.8 kg)  08/22/20 195 lb 9.6 oz (88.7 kg)  07/28/20 193 lb (87.5 kg)    General appearance: alert, cooperative and appears stated age Ears: normal TM's and external ear canals both ears Throat:  lips, mucosa, and tongue normal; teeth and gums normal Neck: no adenopathy, no carotid bruit, supple, symmetrical, trachea midline and thyroid not enlarged, symmetric, no tenderness/mass/nodules Back: symmetric, no curvature. ROM normal. No CVA tenderness. Lungs: clear to auscultation bilaterally Heart: regular rate and rhythm, S1, S2 normal, no murmur, click, rub or gallop Abdomen: soft, non-tender; bowel sounds normal; no masses,  no organomegaly Pulses: 2+ and symmetric Skin: Skin color, texture, turgor normal. No rashes or lesions Lymph nodes: Cervical, supraclavicular, and  axillary nodes normal.  Lab Results  Component Value Date   HGBA1C 6.6 02/05/2021   HGBA1C 7.7 (H) 07/14/2020   HGBA1C 7.1 (H) 03/03/2020    Lab Results  Component Value Date   CREATININE 1.1 02/05/2021   CREATININE 0.92 08/22/2020   CREATININE 0.94 07/23/2020    Lab Results  Component Value Date   WBC 8.2 03/08/2018   HGB 13.1 03/08/2018   HCT 39.0 03/08/2018   PLT 328.0 03/08/2018   GLUCOSE 128 (H) 08/22/2020   CHOL 179 02/05/2021   TRIG 146 02/05/2021   HDL 42 02/05/2021   LDLDIRECT 66.0 11/22/2018   LDLCALC 108 02/05/2021   ALT 31 02/05/2021   AST 24 02/05/2021   NA 141 02/05/2021   K 4.2 02/05/2021   CL 105 02/05/2021   CREATININE 1.1 02/05/2021   BUN 24 (A) 02/05/2021   CO2 27 (A) 02/05/2021   TSH 1.77 02/05/2021   HGBA1C 6.6 02/05/2021   MICROALBUR 51.6 (H) 07/14/2020    MM 3D SCREEN BREAST BILATERAL  Result Date: 01/14/2021 CLINICAL DATA:  Screening. EXAM: DIGITAL SCREENING BILATERAL MAMMOGRAM WITH TOMOSYNTHESIS AND CAD TECHNIQUE: Bilateral screening digital craniocaudal and mediolateral oblique mammograms were obtained. Bilateral screening digital breast tomosynthesis was performed. The images were evaluated with computer-aided detection. COMPARISON:  Previous exam(s). ACR Breast Density Category b: There are scattered areas of fibroglandular density. FINDINGS: There are no findings suspicious for malignancy. IMPRESSION: No mammographic evidence of malignancy. A result letter of this screening mammogram will be mailed directly to the patient. RECOMMENDATION: Screening mammogram in one year. (Code:SM-B-01Y) BI-RADS CATEGORY  1: Negative. Electronically Signed   By: Amie [place] M.D.   On: 01/14/2021 13:41   Assessment & Plan:   Problem List Items Addressed This Visit       Unprioritized   Hyperlipidemia associated with type 2 diabetes mellitus (HCC)    Managed with rosuvastatin  Lab Results  Component Value Date   CHOL 179 02/05/2021   HDL 42  02/05/2021   LDLCALC 108 02/05/2021   LDLDIRECT 66.0 11/22/2018   TRIG 146 02/05/2021   CHOLHDL 4 07/14/2020         Relevant Medications   glipiZIDE (GLUCOTROL) 5 MG tablet   Hypertension    With micralbuminuria.  Continue telmisartan and hctz      Menopause syndrome    Now under treatment with estrogen/testosterone pellet implantation by [facility]       Obesity, diabetes, and hypertension syndrome (HCC)    a1c has improved with better diet. Currently taking only metformin xr.  Intolerant f GLP 1 agonists due to bloating.  Adding back 2.5 mg IR glipizde at dinner time for morning hyperglycemia.  Foot exam done today       Relevant Medications   glipiZIDE (GLUCOTROL) 5 MG tablet   Hypercalcemia - Primary    Rechecking today with PTH and ionized calcium      Relevant Orders   Calcium, ionized   PTH, Intact and Calcium  Meds ordered this encounter  Medications   glipiZIDE (GLUCOTROL) 5 MG tablet    Sig: Take 0.5 tablets (2.5 mg total) by mouth daily before supper.    Dispense:  45 tablet    Refill:  2    Medications Discontinued During This Encounter  Medication Reason   ondansetron (ZOFRAN) 4 MG tablet Patient has not taken in last 30 days   pantoprazole (PROTONIX) 40 MG tablet Patient has not taken in last 30 days   I spent 40  minutes dedicated to the care of this patient on the date of this encounter to include pre-visit review of his medical history,  Face-to-face time with the patient , and post visit ordering of testing and therapeutics.   Follow-up: No follow-ups on file.   Sherlene Shams, MD

## 2021-03-05 NOTE — Assessment & Plan Note (Signed)
Now under treatment with estrogen/testosterone pellet implantation by [facility]

## 2021-03-05 NOTE — Assessment & Plan Note (Signed)
Rechecking today with PTH and ionized calcium

## 2021-03-05 NOTE — Patient Instructions (Addendum)
I'M RECHECKING YOUR CALCIUM LEVELS TODAY  IF THEY ARE NORMAL,  RESUME EFFORTS TO GET 1200 MG CALCIUM DAILY THROUGH DIET AND SUPPLEMENTS   AN A1C OF 6.6 IS EXCELLENT!!!    I RECOMMEND YOU CHANGE YOUR EXERCISE TO AN ACTIVITY THAT BURNS MORE CALORIES THAN WALKING DOES.   RESUME GLIPIZIDE (SHORT ACTING) 2.5 MG BEFORE DINNER.  RX HAS BEEN SENT TO YOUR PHARMACY, BUT IF IT CAUSES HYPOGLYCEMIA ,  STOP IT    TELL GEORGE TO NOT WORRY ABOUT THE NEW MEDICATION. CATIE WILL ADDRESS IT AT HER VISIT

## 2021-03-07 LAB — CALCIUM, IONIZED: Calcium, Ion: 5.45 mg/dL (ref 4.8–5.6)

## 2021-03-07 LAB — PTH, INTACT AND CALCIUM
Calcium: 10.3 mg/dL (ref 8.6–10.4)
PTH: 23 pg/mL (ref 16–77)

## 2021-03-17 DIAGNOSIS — I10 Essential (primary) hypertension: Secondary | ICD-10-CM | POA: Diagnosis not present

## 2021-03-17 DIAGNOSIS — G4733 Obstructive sleep apnea (adult) (pediatric): Secondary | ICD-10-CM | POA: Diagnosis not present

## 2021-03-26 ENCOUNTER — Other Ambulatory Visit: Payer: Self-pay | Admitting: Internal Medicine

## 2021-03-30 ENCOUNTER — Other Ambulatory Visit: Payer: Self-pay | Admitting: Internal Medicine

## 2021-04-16 DIAGNOSIS — G4733 Obstructive sleep apnea (adult) (pediatric): Secondary | ICD-10-CM | POA: Diagnosis not present

## 2021-04-16 DIAGNOSIS — I10 Essential (primary) hypertension: Secondary | ICD-10-CM | POA: Diagnosis not present

## 2021-05-04 ENCOUNTER — Telehealth: Payer: Self-pay | Admitting: Internal Medicine

## 2021-05-04 NOTE — Telephone Encounter (Signed)
Patient called and cancelled up coming appointment with Catie. Patient states she is no longer on the medications that Catie was seeing her for.

## 2021-05-05 ENCOUNTER — Telehealth: Payer: Medicare HMO

## 2021-05-05 DIAGNOSIS — N951 Menopausal and female climacteric states: Secondary | ICD-10-CM | POA: Diagnosis not present

## 2021-05-05 DIAGNOSIS — R5382 Chronic fatigue, unspecified: Secondary | ICD-10-CM | POA: Diagnosis not present

## 2021-05-13 ENCOUNTER — Telehealth: Payer: Self-pay | Admitting: Internal Medicine

## 2021-05-13 DIAGNOSIS — R69 Illness, unspecified: Secondary | ICD-10-CM | POA: Diagnosis not present

## 2021-05-13 DIAGNOSIS — R232 Flushing: Secondary | ICD-10-CM | POA: Diagnosis not present

## 2021-05-13 DIAGNOSIS — Z6838 Body mass index (BMI) 38.0-38.9, adult: Secondary | ICD-10-CM | POA: Diagnosis not present

## 2021-05-13 DIAGNOSIS — R5382 Chronic fatigue, unspecified: Secondary | ICD-10-CM | POA: Diagnosis not present

## 2021-05-13 DIAGNOSIS — N951 Menopausal and female climacteric states: Secondary | ICD-10-CM | POA: Diagnosis not present

## 2021-05-13 NOTE — Telephone Encounter (Signed)
Pt called in stating that she would like to get back on medication (Ozempic). Pt stated that she would like to start on a low dosage first. Pt is requesting callback

## 2021-05-13 NOTE — Telephone Encounter (Signed)
Patient called about re-starting Ozempic. She wants a call back from office today. Please see note below.

## 2021-05-13 NOTE — Telephone Encounter (Addendum)
As Clydie Braun informed her, my scheduler will be calling to set up a visit to discuss.

## 2021-05-17 DIAGNOSIS — G4733 Obstructive sleep apnea (adult) (pediatric): Secondary | ICD-10-CM | POA: Diagnosis not present

## 2021-05-17 DIAGNOSIS — I10 Essential (primary) hypertension: Secondary | ICD-10-CM | POA: Diagnosis not present

## 2021-05-19 ENCOUNTER — Telehealth: Payer: Self-pay

## 2021-05-19 NOTE — Chronic Care Management (AMB) (Signed)
  Care Management   Note  05/19/2021 Name: Penny Butler MRN: [id number] DOB: 04/02/1951  Penny Butler is a 70 y.o. year old female who is a primary care patient of Darrick Huntsman, Mar Daring, MD and is actively engaged with the care management team. I reached out to Penny Butler by phone today to assist with re-scheduling a follow up visit with the Pharmacist  Follow up plan: Unsuccessful telephone outreach attempt made. A HIPAA compliant phone message was left for the patient providing contact information and requesting a return call.  The care management team will reach out to the patient again over the next 7 days.  If patient returns call to provider office, please advise to call Embedded Care Management Care Guide Penny Butler  at [phone]  Penny Butler, RMA Care Guide, Embedded Care Coordination [facility]  [place], [place] [address] Direct Dial: [phone] [email or web address] Website: [email or web address]

## 2021-05-26 NOTE — Chronic Care Management (AMB) (Signed)
  Care Management   Note  05/26/2021 Name: Penny Butler MRN: [id number] DOB: Oct 25, 1950  Penny Butler is a 70 y.o. year old female who is a primary care patient of Darrick Huntsman, Mar Daring, MD and is actively engaged with the care management team. I reached out to [name] by phone today to assist with re-scheduling a follow up visit with the Pharmacist  Follow up plan: Unsuccessful telephone outreach attempt made. A HIPAA compliant phone message was left for the patient providing contact information and requesting a return call.  The care management team will reach out to the patient again over the next 1 days.  If patient returns call to provider office, please advise to call Embedded Care Management Care Guide Saliha Salts  at [phone]  Penne Lash, RMA Care Guide, Embedded Care Coordination [facility]  [place], [place] [address] Direct Dial: [phone] [email or web address]: [email or web address]

## 2021-05-27 NOTE — Chronic Care Management (AMB) (Signed)
  Care Management   Note  05/27/2021 Name: Penny Butler MRN: [id number] DOB: 1950/11/08  Penny Butler is a 70 y.o. year old female who is a primary care patient of Darrick Huntsman, Mar Daring, MD and is actively engaged with the care management team. I reached out to [name] by phone today to assist with re-scheduling a follow up visit with the Pharmacist  Follow up plan: Unsuccessful telephone outreach attempt made.  The care management team will reach out to the patient again over the next 5 days.  If patient returns call to provider office, please advise to call Embedded Care Management Care Guide Penne Lash  at [phone]  Penne Lash, RMA Care Guide, Embedded Care Coordination [facility]  [place], [place] [address] Direct Dial: [phone] [email or web address]: [email or web address]

## 2021-05-28 NOTE — Chronic Care Management (AMB) (Signed)
  Care Management   Note  05/28/2021 Name: Penny Butler MRN: [id number] DOB: 1951-02-26  Penny Butler is a 70 y.o. year old female who is a primary care patient of Darrick Huntsman, Mar Daring, MD and is actively engaged with the care management team. I reached out to [name] by phone today to assist with scheduling a follow up visit with the Pharmacist  Follow up plan: Unsuccessful telephone outreach attempt made.  The care management team will reach out to the patient again over the next 5 days.  If patient returns call to provider office, please advise to call Embedded Care Management Care Guide Penny Butler  at [phone]   Penny Butler, RMA Care Guide, Embedded Care Coordination [facility]  [place], [place] [address] Direct Dial: [phone] [email or web address] Website: [email or web address]

## 2021-06-04 NOTE — Chronic Care Management (AMB) (Signed)
°  Care Management   Note  06/04/2021 Name: Penny Butler MRN: [id number] DOB: [birth year]  Penny Butler is a 70 y.o. year old female who is a primary care patient of Darrick Huntsman, Mar Daring, MD and is actively engaged with the care management team. I reached out to Penny Butler by phone today to assist with re-scheduling a follow up visit with the Pharmacist  Follow up plan: Telephone appointment with care management team member scheduled for:06/24/2021  Penny Butler, RMA Care Guide, Embedded Care Coordination [facility]  [place], [place] [address] Direct Dial: [phone] [email or web address] Website: [email or web address]

## 2021-06-24 ENCOUNTER — Ambulatory Visit (INDEPENDENT_AMBULATORY_CARE_PROVIDER_SITE_OTHER): Payer: Medicare HMO | Admitting: Pharmacist

## 2021-06-24 DIAGNOSIS — E1165 Type 2 diabetes mellitus with hyperglycemia: Secondary | ICD-10-CM

## 2021-06-24 DIAGNOSIS — I10 Essential (primary) hypertension: Secondary | ICD-10-CM

## 2021-06-24 DIAGNOSIS — E7800 Pure hypercholesterolemia, unspecified: Secondary | ICD-10-CM

## 2021-06-24 NOTE — Chronic Care Management (AMB) (Signed)
Chronic Care Management CCM Pharmacy Note  06/24/2021 Name:  Penny Butler MRN:  [id number] DOB:  Oct 08, 1950  Summary: - Patient interested in starting GLP1.   Recommendations/Changes made from today's visit: - Will pursue insurance coverage for [facility] given past trials of Trulicity, Ozempic.   Subjective: Penny Butler is an 71 y.o. year old female who is a primary patient of Tullo, Mar Daring, MD.  The CCM team was consulted for assistance with disease management and care coordination needs.    Engaged with patient by telephone for follow up visit for pharmacy case management and/or care coordination services.   Objective:  Medications Reviewed Today     Reviewed by Lourena Simmonds, RPH-CPP (Pharmacist) on 06/24/21 at [phone]  Med List Status: <None>   Medication Order Taking? Sig Documenting Provider Last Dose Status Informant  glipiZIDE (GLUCOTROL) 5 MG tablet [id number] Yes Take 0.5 tablets (2.5 mg total) by mouth daily before supper. Sherlene Shams, MD Taking Active   glucose blood [facility] VERIO) test strip [id number]  USE AS DIRECTED Once per   Durenda Hurt, MD  Active   hydrochlorothiazide (HYDRODIURIL) 25 MG tablet [id number]  TAKE 1 TABLET (25 MG TOTAL) BY MOUTH DAILY. Sherlene Shams, MD  Active   metFORMIN (GLUCOPHAGE XR) 500 MG 24 hr tablet [id number] Yes Take 2 tablets (1,000 mg total) by mouth daily with breakfast. Sherlene Shams, MD Taking Active            Med Note Guinevere Scarlet Jan 27, 2021  2:24 PM) Taking 500 mg BID  OneTouch Delica Lancets 30G MISC [id number]  USE AS DIRECTED EVERY DAY Sherlene Shams, MD  Active   rosuvastatin (CRESTOR) 5 MG tablet [id number]  TAKE 1 TABLET BY MOUTH EVERY DAY Sherlene Shams, MD  Active   telmisartan (MICARDIS) 40 MG tablet [id number]  TAKE 1 TABLET BY MOUTH EVERYDAY AT BEDTIME Sherlene Shams, MD  Active             Pertinent Labs:   Lab Results  Component Value Date   HGBA1C 6.6 02/05/2021    Lab Results  Component Value Date   CHOL 179 02/05/2021   HDL 42 02/05/2021   LDLCALC 108 02/05/2021   LDLDIRECT 66.0 11/22/2018   TRIG 146 02/05/2021   CHOLHDL 4 07/14/2020   Lab Results  Component Value Date   CREATININE 1.1 02/05/2021   BUN 24 (A) 02/05/2021   NA 141 02/05/2021   K 4.2 02/05/2021   CL 105 02/05/2021   CO2 27 (A) 02/05/2021    SDOH:  (Social Determinants of Health) assessments and interventions performed:  SDOH Interventions    Flowsheet Row Most Recent Value  SDOH Interventions   Financial Strain Interventions Intervention Not Indicated       CCM Care Plan  Review of patient past medical history, allergies, medications, health status, including review of consultants reports, laboratory and other test data, was performed as part of comprehensive evaluation and provision of chronic care management services.   Care Plan : Medication Management  Updates made by Lourena Simmonds, RPH-CPP since 06/24/2021 12:00 AM     Problem: DM, Obesity, HLD, HTN      Long-Range Goal: Disease Progression Prevention   Start Date: 07/17/2020  Recent Progress: On track  Priority: High  Note:   Current Barriers:  Unable to achieve control of diabetes, weight   Pharmacist Clinical Goal(s):  Over the next 90  days, patient will achieve control of diabetes as evidenced by A1c  through collaboration with PharmD and provider.   Interventions: 1:1 collaboration with Sherlene Shams, MD regarding development and update of comprehensive plan of care as evidenced by provider attestation and co-signature Inter-disciplinary care team collaboration (see longitudinal plan of care) Comprehensive medication review performed; medication list updated in electronic medical record  Diabetes: Uncontrolled; current treatment: metformin XR 500 mg BID, glipizide 2.5 mg once daily  Requests retrial of GLP1 today given benefits on A1c, weight management  Hx metformin: nausea, diarrhea  at higher doses and with IR Hx Ozempic, Trulicity: bloating that was negatively impactful to quality of life Hx glipizide: low blood sugars Also reports she tried "natural"  Discussed that we could trial Ozempic at low dose vs pursue coverage for Mounjaro. Patient requests to pursue insurance coverage for [facility] given superior glycemic and weight loss benefit.  Submitted PA for [facility] today. Will follow for result.  If Greggory Keen is not covered and we decide to pursue Ozempic, patient should qualify for assistance.   Hypertension: Generally controlled; current treatment: telmisartan 40 mg daily, HCTZ 25 mg daily Previously recommended to continue current regimen.   Hyperlipidemia: Controlled per last lipid panel; current treatment: rosuvastatin 5 mg daily  Previously recommended to continue current regimen at this time.   Patient Goals/Self-Care Activities Over the next 90 days, patient will:  - take medications as prescribed check glucose daily, document, and provide at future appointments check blood pressure periodically, document, and provide at future appointments       Plan: Telephone follow up appointment with care management team member scheduled for:  pending medication access  Catie Feliz Beam, PharmD, [place], CPP Clinical Pharmacist [employer] at [facility] [phone]

## 2021-06-24 NOTE — Patient Instructions (Signed)
Visit Information  Following are the goals we discussed today:    Patient Goals/Self-Care Activities Over the next 90 days, patient will:  - take medications as prescribed check glucose daily, document, and provide at future appointments check blood pressure periodically, document, and provide at future appointments          Plan: Telephone follow up appointment with care management team member scheduled for:  pending medication access   Catie Feliz Beam, PharmD, BCACP, CPP Clinical Pharmacist [facility] at [facility] [phone]     Please call the care guide team at [phone] if you need to cancel or reschedule your appointment.   Patient verbalizes understanding of instructions provided today and agrees to view in MyChart.

## 2021-06-25 ENCOUNTER — Ambulatory Visit: Payer: Medicare HMO | Admitting: Pharmacist

## 2021-06-25 ENCOUNTER — Telehealth: Payer: Self-pay | Admitting: Pharmacist

## 2021-06-25 DIAGNOSIS — I10 Essential (primary) hypertension: Secondary | ICD-10-CM

## 2021-06-25 DIAGNOSIS — E1169 Type 2 diabetes mellitus with other specified complication: Secondary | ICD-10-CM

## 2021-06-25 DIAGNOSIS — E1165 Type 2 diabetes mellitus with hyperglycemia: Secondary | ICD-10-CM

## 2021-06-25 MED ORDER — OZEMPIC (0.25 OR 0.5 MG/DOSE) 2 MG/1.5ML ~~LOC~~ SOPN
0.2500 mg | PEN_INJECTOR | SUBCUTANEOUS | 2 refills | Status: DC
Start: 2021-06-25 — End: 2021-06-26

## 2021-06-25 MED ORDER — TIRZEPATIDE 2.5 MG/0.5ML ~~LOC~~ SOAJ: 2.5000 mg | SUBCUTANEOUS | 2 refills | Status: DC

## 2021-06-25 NOTE — Addendum Note (Signed)
Addended by: Lourena Simmonds on: 06/25/2021 12:52 PM   Modules accepted: Orders

## 2021-06-25 NOTE — Chronic Care Management (AMB) (Signed)
Chronic Care Management CCM Pharmacy Note  06/25/2021 Name:  Penny Butler MRN:  [id number] DOB:  26-Jun-1950  Summary: - PA for [facility] approved, but covered at a high Tier.   Recommendations/Changes made from today's visit: - Start Ozempic 0.25 mg weekly  Subjective: Penny Butler is an 71 y.o. year old female who is a primary patient of Tullo, Mar Daring, MD.  The CCM team was consulted for assistance with disease management and care coordination needs.    Engaged with patient by telephone for follow up visit for pharmacy case management and/or care coordination services.   Objective:  Medications Reviewed Today     Reviewed by Lourena Simmonds, RPH-CPP (Pharmacist) on 06/24/21 at [phone]  Med List Status: <None>   Medication Order Taking? Sig Documenting Provider Last Dose Status Informant  glipiZIDE (GLUCOTROL) 5 MG tablet [id number] Yes Take 0.5 tablets (2.5 mg total) by mouth daily before supper. Sherlene Shams, MD Taking Active   glucose blood [facility] VERIO) test strip [id number]  USE AS DIRECTED Once per   Durenda Hurt, MD  Active   hydrochlorothiazide (HYDRODIURIL) 25 MG tablet [id number]  TAKE 1 TABLET (25 MG TOTAL) BY MOUTH DAILY. Sherlene Shams, MD  Active   metFORMIN (GLUCOPHAGE XR) 500 MG 24 hr tablet [id number] Yes Take 2 tablets (1,000 mg total) by mouth daily with breakfast. Sherlene Shams, MD Taking Active            Med Note Guinevere Scarlet Jan 27, 2021  2:24 PM) Taking 500 mg BID  OneTouch Delica Lancets 30G MISC [id number]  USE AS DIRECTED EVERY DAY Sherlene Shams, MD  Active   rosuvastatin (CRESTOR) 5 MG tablet [id number]  TAKE 1 TABLET BY MOUTH EVERY DAY Sherlene Shams, MD  Active   telmisartan (MICARDIS) 40 MG tablet [id number]  TAKE 1 TABLET BY MOUTH EVERYDAY AT BEDTIME Sherlene Shams, MD  Active             Pertinent Labs:   Lab Results  Component Value Date   HGBA1C 6.6 02/05/2021   Lab Results  Component Value Date    CHOL 179 02/05/2021   HDL 42 02/05/2021   LDLCALC 108 02/05/2021   LDLDIRECT 66.0 11/22/2018   TRIG 146 02/05/2021   CHOLHDL 4 07/14/2020   Lab Results  Component Value Date   CREATININE 1.1 02/05/2021   BUN 24 (A) 02/05/2021   NA 141 02/05/2021   K 4.2 02/05/2021   CL 105 02/05/2021   CO2 27 (A) 02/05/2021    SDOH:  (Social Determinants of Health) assessments and interventions performed:    CCM Care Plan  Review of patient past medical history, allergies, medications, health status, including review of consultants reports, laboratory and other test data, was performed as part of comprehensive evaluation and provision of chronic care management services.   Care Plan : Medication Management  Updates made by Lourena Simmonds, RPH-CPP since 06/25/2021 12:00 AM     Problem: DM, Obesity, HLD, HTN      Long-Range Goal: Disease Progression Prevention   Start Date: 07/17/2020  Recent Progress: On track  Priority: High  Note:   Current Barriers:  Unable to achieve control of diabetes, weight   Pharmacist Clinical Goal(s):  Over the next 90 days, patient will achieve control of diabetes as evidenced by A1c  through collaboration with PharmD and provider.   Interventions: 1:1 collaboration with Sherlene Shams, MD  regarding development and update of comprehensive plan of care as evidenced by provider attestation and co-signature Inter-disciplinary care team collaboration (see longitudinal plan of care) Comprehensive medication review performed; medication list updated in electronic medical record  Diabetes: Uncontrolled; current treatment: metformin XR 500 mg BID, glipizide 2.5 mg once daily  Requests retrial of GLP1 today given benefits on A1c, weight management  Hx metformin: nausea, diarrhea at higher doses and with IR Hx Ozempic, Trulicity: bloating that was negatively impactful to quality of life Hx glipizide: low blood sugars Also reports she tried "natural"  PA for  [facility] approved, but covered at a non-formulary Tier. Copay $195 per month. Patient unwilling to pay this. Will try low dose Ozempic again. Start Ozempic 0.25 mg weekly.  Stop glipizide. Patient verbalizes understanding.   Hypertension: Generally controlled; current treatment: telmisartan 40 mg daily, HCTZ 25 mg daily Previously recommended to continue current regimen.   Hyperlipidemia: Controlled per last lipid panel; current treatment: rosuvastatin 5 mg daily  Previously recommended to continue current regimen at this time.   Patient Goals/Self-Care Activities Over the next 90 days, patient will:  - take medications as prescribed check glucose daily, document, and provide at future appointments check blood pressure periodically, document, and provide at future appointments       Plan: Telephone follow up appointment with care management team member scheduled for:  6 weeks  Catie Feliz Beam, PharmD, [place], CPP Clinical Pharmacist [employer] at [employer] [phone]

## 2021-06-25 NOTE — Telephone Encounter (Signed)
Patient Called in requesting to speak with Catie , please call patient back @ [phone]

## 2021-06-25 NOTE — Telephone Encounter (Signed)
Additional documentation required for [facility] PA. Submitted via fax today.

## 2021-06-25 NOTE — Telephone Encounter (Signed)
Pt returning call. Pt states her medication copay is too expensive and is wondering if she can receive any assistance towards this.

## 2021-06-25 NOTE — Telephone Encounter (Signed)
Patient called in stated returning call to Catie. Please call patient back @ [phone]

## 2021-06-25 NOTE — Patient Instructions (Signed)
Stop glipizide. Continue metformin.   Start Ozempic 0.25 mg weekly. This medication may cause stomach upset, queasiness, or constipation, especially when first starting. This generally improves over time. Call our office if these symptoms occur and worsen, or if you have severe symptoms such as vomiting, diarrhea, or stomach pain.   Take care!   Catie Feliz Beam, PharmD  Visit Information  Following are the goals we discussed today:  Patient Goals/Self-Care Activities Over the next 90 days, patient will:  - take medications as prescribed check glucose daily, document, and provide at future appointments check blood pressure periodically, document, and provide at future appointments          Plan: Telephone follow up appointment with care management team member scheduled for:  6 weeks   Catie Feliz Beam, PharmD, [place], CPP Clinical Pharmacist [facility] at [facility] [phone]     Please call the care guide team at [phone] if you need to cancel or reschedule your appointment.   Patient verbalizes understanding of instructions provided today and agrees to view in MyChart.

## 2021-06-25 NOTE — Telephone Encounter (Signed)
Received PA approval for [facility], noted that it is approved as "non-formulary". Unsure what Tier this will be.   Attempted to call patient. Unable to leave voicemail.

## 2021-06-25 NOTE — Telephone Encounter (Signed)
See CCM note 

## 2021-06-26 MED ORDER — OZEMPIC (0.25 OR 0.5 MG/DOSE) 2 MG/1.5ML ~~LOC~~ SOPN: 0.5000 mg | PEN_INJECTOR | SUBCUTANEOUS | 2 refills | Status: DC

## 2021-06-26 NOTE — Addendum Note (Signed)
Addended by: Lourena Simmonds on: 06/26/2021 11:46 AM   Modules accepted: Orders

## 2021-06-26 NOTE — Telephone Encounter (Addendum)
Pt called in stating that she needs 30 day instead of 60 day supply so it can be cheaper. Pt need the supply to be changed asap before they put it back

## 2021-06-26 NOTE — Telephone Encounter (Signed)
Adjusted prescription. Counseled patient.

## 2021-06-27 ENCOUNTER — Other Ambulatory Visit: Payer: Self-pay | Admitting: Internal Medicine

## 2021-07-03 NOTE — Telephone Encounter (Signed)
Pt called in requesting to speak with you. Pt stated it's in regards to medication. Pt stated she wanted to provide you with an update. Pt requesting callback.

## 2021-07-03 NOTE — Telephone Encounter (Signed)
Called patient back. Call went to voicemail, was unable to leave voicemail

## 2021-07-03 NOTE — Telephone Encounter (Signed)
Called patient. She notes she is changing to a different insurance plan in the next few weeks that she believes will cover Mounjaro. She will let me know. She is aware that this medication will likely push her into the coverage gap, but we will follow and see if Darcel Bayley is on [employer] PAP at that time, or we can pursue Ozempic through [employer].

## 2021-07-05 ENCOUNTER — Other Ambulatory Visit: Payer: Self-pay | Admitting: Internal Medicine

## 2021-07-16 NOTE — Telephone Encounter (Signed)
Called patient. She provided updated insurance information  BIN: [id number] PCN: [id number] Group: [id number]: [id number]  Reports this plan will go into affect 07/22/21. Will attempt PA at that time

## 2021-07-16 NOTE — Telephone Encounter (Signed)
Pt called in stating she have her updated insurance information for you. Pt requesting callback.

## 2021-07-20 ENCOUNTER — Other Ambulatory Visit: Payer: Self-pay | Admitting: Internal Medicine

## 2021-07-21 DIAGNOSIS — E7800 Pure hypercholesterolemia, unspecified: Secondary | ICD-10-CM

## 2021-07-21 DIAGNOSIS — E1165 Type 2 diabetes mellitus with hyperglycemia: Secondary | ICD-10-CM

## 2021-07-21 DIAGNOSIS — E1169 Type 2 diabetes mellitus with other specified complication: Secondary | ICD-10-CM | POA: Diagnosis not present

## 2021-07-21 DIAGNOSIS — E785 Hyperlipidemia, unspecified: Secondary | ICD-10-CM

## 2021-07-21 DIAGNOSIS — I10 Essential (primary) hypertension: Secondary | ICD-10-CM | POA: Diagnosis not present

## 2021-07-22 ENCOUNTER — Telehealth: Payer: Self-pay | Admitting: Pharmacist

## 2021-07-22 DIAGNOSIS — E1165 Type 2 diabetes mellitus with hyperglycemia: Secondary | ICD-10-CM

## 2021-07-22 NOTE — Telephone Encounter (Signed)
Patient had previously called in new insurance information that was to take effect 07/22/21. She was interested in seeing if Greggory Keen was better covered on this new plan  BIN: [id number] PCN: [id number] Group: COS ID: [id number]  Attempted PA via Cover My Meds. Response was:  "This medication or product is on your plan's list of covered drugs. Prior authorization is not required at this time."

## 2021-07-22 NOTE — Telephone Encounter (Signed)
Pt called in stating that she would like you to contact her before sending script for [facility] to pharmacy. Pt requesting callback

## 2021-07-22 NOTE — Telephone Encounter (Signed)
Attempted to call patient. Unable to leave voicemail.  

## 2021-07-23 MED ORDER — TIRZEPATIDE 2.5 MG/0.5ML ~~LOC~~ SOAJ: 2.5000 mg | SUBCUTANEOUS | 2 refills | Status: DC

## 2021-07-23 NOTE — Telephone Encounter (Signed)
Patient called back. She requests to stop Ozempic and start Mounjaro. She is tolerating Ozempic, but would like to trial [facility] given superior data.   Stop Ozempic, start Mounjaro at date of next scheduled Ozempic shot. Start Mounjaro at 2.5 mg weekly. GI counseling provided. Script sent to pharmacy, we will follow up on 2/16 as scheduled.

## 2021-07-23 NOTE — Telephone Encounter (Signed)
Attempted to call patient again. Unable to leave voicemail 

## 2021-07-29 ENCOUNTER — Ambulatory Visit: Payer: Medicare HMO

## 2021-07-31 ENCOUNTER — Other Ambulatory Visit: Payer: Self-pay | Admitting: Internal Medicine

## 2021-08-06 ENCOUNTER — Ambulatory Visit (INDEPENDENT_AMBULATORY_CARE_PROVIDER_SITE_OTHER): Payer: BC Managed Care – PPO | Admitting: Pharmacist

## 2021-08-06 DIAGNOSIS — E785 Hyperlipidemia, unspecified: Secondary | ICD-10-CM

## 2021-08-06 DIAGNOSIS — E1169 Type 2 diabetes mellitus with other specified complication: Secondary | ICD-10-CM

## 2021-08-06 DIAGNOSIS — I10 Essential (primary) hypertension: Secondary | ICD-10-CM

## 2021-08-06 DIAGNOSIS — E1165 Type 2 diabetes mellitus with hyperglycemia: Secondary | ICD-10-CM

## 2021-08-06 NOTE — Chronic Care Management (AMB) (Signed)
Chronic Care Management CCM Pharmacy Note  08/06/2021 Name:  Penny Butler MRN:  [id number] DOB:  11-Jun-1951  Summary: Penny Butler  Recommendations/Changes made from today's visit: - Continue current regimen at this time - Discussed goal LDL.  - Scheduled for PCP follow up and lab work  Subjective: Penny Butler is an 71 y.o. year old female who is a primary patient of Tullo, Mar Daring, MD.  The CCM team was consulted for assistance with disease management and care coordination needs.    Engaged with patient by telephone for follow up visit for pharmacy case management and/or care coordination services.   Objective:  Medications Reviewed Today     Reviewed by Lourena Simmonds, RPH-CPP (Pharmacist) on 08/06/21 at 1138  Med List Status: <None>   Medication Order Taking? Sig Documenting Provider Last Dose Status Informant  hydrochlorothiazide (HYDRODIURIL) 25 MG tablet [id number] Yes TAKE 1 TABLET (25 MG TOTAL) BY MOUTH DAILY. Sherlene Shams, MD Taking Active   metFORMIN (GLUCOPHAGE-XR) 500 MG 24 hr tablet [id number] Yes TAKE 2 TABLETS BY MOUTH EVERY DAY WITH BREAKFAST Sherlene Shams, MD Taking Active   OneTouch Delica Lancets 30G [place] [id number]  USE AS DIRECTED EVERY DAY Sherlene Shams, MD  Active   [facility] VERIO test strip [id number]  USE AS DIRECTED ONCE PER DAY Sherlene Shams, MD  Active   rosuvastatin (CRESTOR) 5 MG tablet [id number] Yes TAKE 1 TABLET BY MOUTH EVERY DAY Sherlene Shams, MD Taking Active   telmisartan (MICARDIS) 40 MG tablet [id number] Yes TAKE 1 TABLET BY MOUTH EVERYDAY AT BEDTIME Sherlene Shams, MD Taking Active   tirzepatide [facility]) 2.5 MG/0.5ML Pen [id number] Yes Inject 2.5 mg into the skin once a week. Sherlene Shams, MD Taking Active             Pertinent Labs:   Lab Results  Component Value Date   HGBA1C 6.6 02/05/2021   Lab Results  Component Value Date   CHOL 179 02/05/2021   HDL 42 02/05/2021   LDLCALC 108 02/05/2021    LDLDIRECT 66.0 11/22/2018   TRIG 146 02/05/2021   CHOLHDL 4 07/14/2020   Lab Results  Component Value Date   CREATININE 1.1 02/05/2021   BUN 24 (A) 02/05/2021   NA 141 02/05/2021   K 4.2 02/05/2021   CL 105 02/05/2021   CO2 27 (A) 02/05/2021    SDOH:  (Social Determinants of Health) assessments and interventions performed:    CCM Care Plan  Review of patient past medical history, allergies, medications, health status, including review of consultants reports, laboratory and other test data, was performed as part of comprehensive evaluation and provision of chronic care management services.   Care Plan : Medication Management  Updates made by Lourena Simmonds, RPH-CPP since 08/06/2021 12:00 AM     Problem: DM, Obesity, HLD, HTN      Long-Range Goal: Disease Progression Prevention   Start Date: 07/17/2020  Recent Progress: On track  Priority: High  Note:   Current Barriers:  Unable to achieve control of diabetes, weight   Pharmacist Clinical Goal(s):  Over the next 90 days, patient will achieve control of diabetes as evidenced by A1c  through collaboration with PharmD and provider.   Interventions: 1:1 collaboration with Sherlene Shams, MD regarding development and update of comprehensive plan of care as evidenced by provider attestation and co-signature Inter-disciplinary care team collaboration (see longitudinal plan of care) Comprehensive medication review performed; medication list  updated in electronic medical record  Health Maintenance   Yearly diabetic eye exam: up to date Yearly diabetic foot exam: up to date Urine microalbumin: up to date Yearly influenza vaccination: up to date Td/Tdap vaccination: due Pneumonia vaccination: due COVID vaccinations: due Shingrix vaccinations: up to date Colonoscopy: up to date Bone density scan: up to date Mammogram: up to date Overdue for PCP visit and lab work. Scheduled.    Diabetes: Controlled; current  treatment: metformin XR 500 mg BID, Mounjaro 2.5 mg weekly (x 2 weeks) Hx metformin: nausea, diarrhea at higher doses and with IR Hx Ozempic, Trulicity: bloating that was negatively impactful to quality of life at higher doses Hx glipizide: low blood sugars Reports she is tolerating regimen well. Denies GI upset Recommended to continue current regimen at this time.   Hypertension: Generally controlled; current treatment: telmisartan 40 mg daily, HCTZ 25 mg daily Home BP readings: reports the reading at her hormonal clinic was 130/72 Previously recommended to continue current regimen.   Hyperlipidemia: Uncontrolled; current treatment: rosuvastatin 5 mg daily  Discussed goal LDL <70. If next LDL not at goal, recommend to increase rosuvastatin dose.  Previously recommended to continue current regimen at this time.   Patient Goals/Self-Care Activities Over the next 90 days, patient will:  - take medications as prescribed check glucose daily, document, and provide at future appointments check blood pressure periodically, document, and provide at future appointments       Plan: Telephone follow up appointment with care management team member scheduled for:  10 weeks   Catie Feliz Beam, PharmD, [place], CPP Clinical Pharmacist [employer] at [facility] [phone]

## 2021-08-06 NOTE — Patient Instructions (Signed)
Penny Butler,   Continue Mounjaro 2.5 mg weekly and metformin XR 500 mg twice daily.   Check your blood pressure periodically at home. Our goal is less than 130/80.   We recommend you get the updated bivalent COVID-19 booster, at least 2 months after any prior doses. You may consider delaying a booster dose by 3 months from a prior episode of COVID-19 per the CDC.   We recommend the Tdap (tetanus, diptheria, and pertussis) vaccine once every 10 years. It should have a $0 copay on all Medicare plans this year. You can pursue this without a prescription at your local pharmacy, or feel free to call our [facility] at [facility] at [phone]. This pharmacy also carries the COVID booster.   When you see Dr. Darrick Huntsman next, plan to discuss a Pneumonia vaccine that is recommended for all over the age of 57.   Take care!  Penny Butler, PharmD  Visit Information  Following are the goals we discussed today:  Patient Goals/Self-Care Activities Over the next 90 days, patient will:  - take medications as prescribed check glucose daily, document, and provide at future appointments check blood pressure periodically, document, and provide at future appointments          Plan: Telephone follow up appointment with care management team member scheduled for:  10 weeks    Penny Butler, PharmD, [place], CPP Clinical Pharmacist [facility] [phone]     Please call the care guide team at [phone] if you need to cancel or reschedule your appointment.   Patient verbalizes understanding of instructions and care plan provided today and agrees to view in MyChart. Active MyChart status confirmed with patient.

## 2021-08-13 DIAGNOSIS — R5382 Chronic fatigue, unspecified: Secondary | ICD-10-CM | POA: Diagnosis not present

## 2021-08-18 ENCOUNTER — Telehealth: Payer: Self-pay | Admitting: Internal Medicine

## 2021-08-18 DIAGNOSIS — E1169 Type 2 diabetes mellitus with other specified complication: Secondary | ICD-10-CM | POA: Diagnosis not present

## 2021-08-18 DIAGNOSIS — E1165 Type 2 diabetes mellitus with hyperglycemia: Secondary | ICD-10-CM

## 2021-08-18 DIAGNOSIS — I10 Essential (primary) hypertension: Secondary | ICD-10-CM | POA: Diagnosis not present

## 2021-08-18 DIAGNOSIS — R232 Flushing: Secondary | ICD-10-CM | POA: Diagnosis not present

## 2021-08-18 DIAGNOSIS — E785 Hyperlipidemia, unspecified: Secondary | ICD-10-CM | POA: Diagnosis not present

## 2021-08-18 MED ORDER — TIRZEPATIDE 2.5 MG/0.5ML ~~LOC~~ SOAJ: 2.5000 mg | SUBCUTANEOUS | 2 refills | Status: DC

## 2021-08-18 NOTE — Telephone Encounter (Signed)
Medication has been refilled.

## 2021-08-18 NOTE — Telephone Encounter (Signed)
Pt called in for a refill of munjaro.

## 2021-08-24 ENCOUNTER — Other Ambulatory Visit: Payer: BC Managed Care – PPO

## 2021-08-25 ENCOUNTER — Ambulatory Visit: Payer: Self-pay | Admitting: Pharmacist

## 2021-08-25 ENCOUNTER — Other Ambulatory Visit: Payer: Self-pay

## 2021-08-25 ENCOUNTER — Other Ambulatory Visit (INDEPENDENT_AMBULATORY_CARE_PROVIDER_SITE_OTHER): Payer: Medicare Other

## 2021-08-25 DIAGNOSIS — E785 Hyperlipidemia, unspecified: Secondary | ICD-10-CM

## 2021-08-25 DIAGNOSIS — I10 Essential (primary) hypertension: Secondary | ICD-10-CM | POA: Diagnosis not present

## 2021-08-25 DIAGNOSIS — E1169 Type 2 diabetes mellitus with other specified complication: Secondary | ICD-10-CM

## 2021-08-25 DIAGNOSIS — E1165 Type 2 diabetes mellitus with hyperglycemia: Secondary | ICD-10-CM

## 2021-08-25 LAB — COMPREHENSIVE METABOLIC PANEL
ALT: 21 U/L (ref 0–35)
AST: 16 U/L (ref 0–37)
Albumin: 4.1 g/dL (ref 3.5–5.2)
Alkaline Phosphatase: 85 U/L (ref 39–117)
BUN: 25 mg/dL — ABNORMAL HIGH (ref 6–23)
CO2: 28 mEq/L (ref 19–32)
Calcium: 10.2 mg/dL (ref 8.4–10.5)
Chloride: 101 mEq/L (ref 96–112)
Creatinine, Ser: 1.02 mg/dL (ref 0.40–1.20)
GFR: 56.65 mL/min — ABNORMAL LOW (ref >60.00)
Glucose, Bld: 173 mg/dL — ABNORMAL HIGH (ref 70–99)
Potassium: 4.1 mEq/L (ref 3.5–5.1)
Sodium: 137 mEq/L (ref 135–145)
Total Bilirubin: 0.4 mg/dL (ref 0.2–1.2)
Total Protein: 6.8 g/dL (ref 6.0–8.3)

## 2021-08-25 LAB — HEMOGLOBIN A1C: Hgb A1c MFr Bld: 8.2 % — ABNORMAL HIGH (ref 4.6–6.5)

## 2021-08-25 LAB — LIPID PANEL
Cholesterol: 127 mg/dL (ref 0–200)
HDL: 32.9 mg/dL — ABNORMAL LOW (ref >39.00)
NonHDL: 93.91
Total CHOL/HDL Ratio: 4
Triglycerides: 209 mg/dL — ABNORMAL HIGH (ref 0.0–149.0)
VLDL: 41.8 mg/dL — ABNORMAL HIGH (ref 0.0–40.0)

## 2021-08-25 LAB — LDL CHOLESTEROL, DIRECT: Direct LDL: 53 mg/dL

## 2021-08-25 NOTE — Patient Instructions (Signed)
Hi Tressia,  ? ?Unfortunately, I am being asked to quickly transition into another role within the health system, so I am unable to keep our next appointment. Please continue to follow up with your primary care provider as scheduled.  ? ?It has been a pleasure working with you! ? ?Catie Feliz Beam, PharmD ? ?

## 2021-08-25 NOTE — Chronic Care Management (AMB) (Signed)
?  Chronic Care Management  ? ?Note ? ?08/25/2021 ?Name: Penny Butler MRN: [id number] DOB: 1950-12-02 ? ? ? ?Closing pharmacy CCM case at this time. Patient has clinic contact information for future questions or concerns.  ? ?Catie Feliz Beam, PharmD, [place], CPP ?Clinical Pharmacist ?Nature conservation officer at [employer] ?[phone] ? ?

## 2021-08-28 ENCOUNTER — Telehealth: Payer: Self-pay | Admitting: Internal Medicine

## 2021-08-28 ENCOUNTER — Other Ambulatory Visit: Payer: Self-pay

## 2021-08-28 ENCOUNTER — Other Ambulatory Visit: Payer: Self-pay | Admitting: Internal Medicine

## 2021-08-28 ENCOUNTER — Encounter: Payer: Self-pay | Admitting: Internal Medicine

## 2021-08-28 ENCOUNTER — Ambulatory Visit (INDEPENDENT_AMBULATORY_CARE_PROVIDER_SITE_OTHER): Payer: Medicare Other | Admitting: Internal Medicine

## 2021-08-28 VITALS — BP 130/82 | HR 91 | Temp 98.3°F | Ht 61.0 in | Wt 192.6 lb

## 2021-08-28 DIAGNOSIS — I10 Essential (primary) hypertension: Secondary | ICD-10-CM

## 2021-08-28 DIAGNOSIS — E1159 Type 2 diabetes mellitus with other circulatory complications: Secondary | ICD-10-CM | POA: Diagnosis not present

## 2021-08-28 DIAGNOSIS — E1121 Type 2 diabetes mellitus with diabetic nephropathy: Secondary | ICD-10-CM | POA: Diagnosis not present

## 2021-08-28 DIAGNOSIS — E1169 Type 2 diabetes mellitus with other specified complication: Secondary | ICD-10-CM | POA: Diagnosis not present

## 2021-08-28 DIAGNOSIS — E1165 Type 2 diabetes mellitus with hyperglycemia: Secondary | ICD-10-CM | POA: Diagnosis not present

## 2021-08-28 DIAGNOSIS — E669 Obesity, unspecified: Secondary | ICD-10-CM

## 2021-08-28 DIAGNOSIS — E785 Hyperlipidemia, unspecified: Secondary | ICD-10-CM | POA: Diagnosis not present

## 2021-08-28 MED ORDER — TETANUS-DIPHTH-ACELL PERTUSSIS 5-2.5-18.5 LF-MCG/0.5 IM SUSY: 0.5000 mL | PREFILLED_SYRINGE | Freq: Once | INTRAMUSCULAR | 0 refills | Status: AC

## 2021-08-28 MED ORDER — TIRZEPATIDE 5 MG/0.5ML ~~LOC~~ SOAJ: 5.0000 mg | SUBCUTANEOUS | 3 refills | Status: DC

## 2021-08-28 MED ORDER — ONDANSETRON HCL 4 MG PO TABS: 4.0000 mg | ORAL_TABLET | Freq: Three times a day (TID) | ORAL | 0 refills | Status: DC | PRN

## 2021-08-28 NOTE — Patient Instructions (Addendum)
You can increase the mounjaro dose to 5 mg weekly as soon as insurance will pay for the higher dose.  ? ?The higher dose has been sent to your pharmacy today  ? ?Please check Blood sugars once daily at various times (fasting,  2 hrs after a meal, etc) and drop them off in one month  ? ?Return for fasting labs in 3 months,  prior to your next visit with me ? ? ?Try the KIND low glycemic index  snack bars  ? ? ?Go get your tetanus booster at your pharmacy ? ?Lab Results  ?Component Value Date  ? HGBA1C 8.2 (H) 08/25/2021  ? ? ?

## 2021-08-28 NOTE — Telephone Encounter (Signed)
Pt need prior authorization for monjaro through her insurance company [employer] ?

## 2021-08-28 NOTE — Progress Notes (Signed)
? ?Subjective:  ?Patient ID: Penny Butler, female    DOB: 02-04-1951  Age: 71 y.o. MRN: [id number] ? ?CC: The primary encounter diagnosis was Primary hypertension. Diagnoses of Diabetes mellitus with proteinuric diabetic nephropathy ([place]), Hyperlipidemia associated with type 2 diabetes mellitus ([place]), Uncontrolled type 2 diabetes mellitus with hyperglycemia ([place]), and Obesity, diabetes, and hypertension syndrome ([place]) were also pertinent to this visit. ? ? ?This visit occurred during the SARS-CoV-2 public health emergency.  Safety protocols were in place, including screening questions prior to the visit, additional usage of staff PPE, and extensive cleaning of exam room while observing appropriate contact time as indicated for disinfecting solutions.   ? ?HPI ?Penny Butler presents for  ?Chief Complaint  ?Patient presents with  ? Follow-up  ?  6 month follow up on diabetes, hypertension and hyperlipidemia  ? ? ?T2DM:  She  feels generally well,  is exercising regularly and  trying to lose weight. Checking  blood sugars less than once daily at variable times, more  if she feels she may be having a hypoglycemic event. .  Last two BS have been under 160  fasting and  165 post prandially.  Denies any recent hypoglyemic events.  Taking   metformin bid,  glipizide,  and mounjaro x 5 weeks.  as directed. Following a carbohydrate modified diet 6 days per week. Denies numbness, burning and tingling of extremities. Appetite is good.   ? ?Hypertension: patient checks blood pressure one a month at home.  Readings have been for the most part < 140/80 at rest . Patient is following a reduce salt diet most days and is taking medications as prescribed  ? ?Outpatient Medications Prior to Visit  ?Medication Sig Dispense Refill  ? hydrochlorothiazide (HYDRODIURIL) 25 MG tablet TAKE 1 TABLET (25 MG TOTAL) BY MOUTH DAILY. 90 tablet 1  ? metFORMIN (GLUCOPHAGE-XR) 500 MG 24 hr tablet TAKE 2 TABLETS BY MOUTH EVERY DAY WITH  BREAKFAST 180 tablet 1  ? OneTouch Delica Lancets 99991111 MISC USE AS DIRECTED EVERY DAY 100 each 5  ? ONETOUCH VERIO test strip USE AS DIRECTED ONCE PER DAY 100 strip 5  ? rosuvastatin (CRESTOR) 5 MG tablet TAKE 1 TABLET BY MOUTH EVERY DAY 90 tablet 1  ? telmisartan (MICARDIS) 40 MG tablet TAKE 1 TABLET BY MOUTH EVERYDAY AT BEDTIME 90 tablet 1  ? tirzepatide (MOUNJARO) 2.5 MG/0.5ML Pen Inject 2.5 mg into the skin once a week. 2 mL 2  ? ?No facility-administered medications prior to visit.  ? ? ?Review of Systems; ? ?Patient denies headache, fevers, malaise, unintentional weight loss, skin rash, eye pain, sinus congestion and sinus pain, sore throat, dysphagia,  hemoptysis , cough, dyspnea, wheezing, chest pain, palpitations, orthopnea, edema, abdominal pain, nausea, melena, diarrhea, constipation, flank pain, dysuria, hematuria, urinary  Frequency, nocturia, numbness, tingling, seizures,  Focal weakness, Loss of consciousness,  Tremor, insomnia, depression, anxiety, and suicidal ideation.   ? ? ? ?Objective:  ?BP 130/82 (BP Location: Left Arm, Patient Position: Sitting, Cuff Size: Normal)   Pulse 91   Temp 98.3 ?F (36.8 ?C) (Oral)   Ht 5\' 1"  (1.549 m)   Wt 192 lb 9.6 oz (87.4 kg)   SpO2 98%   BMI 36.39 kg/m?  ? ?BP Readings from Last 3 Encounters:  ?08/28/21 130/82  ?03/05/21 138/82  ?08/22/20 (!) 152/88  ? ? ?Wt Readings from Last 3 Encounters:  ?08/28/21 192 lb 9.6 oz (87.4 kg)  ?03/05/21 200 lb 3.2 oz (90.8 kg)  ?08/22/20  195 lb 9.6 oz (88.7 kg)  ? ? ?General appearance: alert, cooperative and appears stated age ?Ears: normal TM's and external ear canals both ears ?Throat: lips, mucosa, and tongue normal; teeth and gums normal ?Neck: no adenopathy, no carotid bruit, supple, symmetrical, trachea midline and thyroid not enlarged, symmetric, no tenderness/mass/nodules ?Back: symmetric, no curvature. ROM normal. No CVA tenderness. ?Lungs: clear to auscultation bilaterally ?Heart: regular rate and rhythm, S1, S2  normal, no murmur, click, rub or gallop ?Abdomen: soft, non-tender; bowel sounds normal; no masses,  no organomegaly ?Pulses: 2+ and symmetric ?Skin: Skin color, texture, turgor normal. No rashes or lesions ?Lymph nodes: Cervical, supraclavicular, and axillary nodes normal. ? ?Lab Results  ?Component Value Date  ? HGBA1C 8.2 (H) 08/25/2021  ? HGBA1C 6.6 02/05/2021  ? HGBA1C 7.7 (H) 07/14/2020  ? ? ?Lab Results  ?Component Value Date  ? CREATININE 1.02 08/25/2021  ? CREATININE 1.1 02/05/2021  ? CREATININE 0.92 08/22/2020  ? ? ?Lab Results  ?Component Value Date  ? WBC 6.7 02/05/2021  ? HGB 13.6 02/05/2021  ? HCT 41 02/05/2021  ? PLT 295 02/05/2021  ? GLUCOSE 173 (H) 08/25/2021  ? CHOL 127 08/25/2021  ? TRIG 209.0 (H) 08/25/2021  ? HDL 32.90 (L) 08/25/2021  ? LDLDIRECT 53.0 08/25/2021  ? [place] 108 02/05/2021  ? ALT 21 08/25/2021  ? AST 16 08/25/2021  ? NA 137 08/25/2021  ? K 4.1 08/25/2021  ? CL 101 08/25/2021  ? CREATININE 1.02 08/25/2021  ? BUN 25 (H) 08/25/2021  ? CO2 28 08/25/2021  ? TSH 1.77 02/05/2021  ? HGBA1C 8.2 (H) 08/25/2021  ? MICROALBUR 51.6 (H) 07/14/2020  ? ? ?MM 3D SCREEN BREAST BILATERAL ? ?Result Date: 01/14/2021 ?CLINICAL DATA:  Screening. EXAM: DIGITAL SCREENING BILATERAL MAMMOGRAM WITH TOMOSYNTHESIS AND CAD TECHNIQUE: Bilateral screening digital craniocaudal and mediolateral oblique mammograms were obtained. Bilateral screening digital breast tomosynthesis was performed. The images were evaluated with computer-aided detection. COMPARISON:  Previous exam(s). ACR Breast Density Category b: There are scattered areas of fibroglandular density. FINDINGS: There are no findings suspicious for malignancy. IMPRESSION: No mammographic evidence of malignancy. A result letter of this screening mammogram will be mailed directly to the patient. RECOMMENDATION: Screening mammogram in one year. (Code:SM-B-01Y) BI-RADS CATEGORY  1: Negative. Electronically Signed   By: Lajean Manes M.D.   On: 01/14/2021  13:41 ? ? ?Assessment & Plan:  ? ?Problem List Items Addressed This Visit   ? ? Hyperlipidemia associated with type 2 diabetes mellitus ([place])  ?  Managed with rosuvastatin without side effects.  LDL is at goal  ? ?Lab Results  ?Component Value Date  ? CHOL 127 08/25/2021  ? HDL 32.90 (L) 08/25/2021  ? [place] 108 02/05/2021  ? LDLDIRECT 53.0 08/25/2021  ? TRIG 209.0 (H) 08/25/2021  ? CHOLHDL 4 08/25/2021  ? ? ?  ?  ? Relevant Medications  ? tirzepatide [facility]) 5 MG/0.5ML Pen  ? Other Relevant Orders  ? Lipid panel  ? LDL cholesterol, direct  ? Hypertension - Primary  ?  Well controlled on current regimen of telmisartan and hctz. Renal function stable, no changes today. ? ?Lab Results  ?Component Value Date  ? CREATININE 1.02 08/25/2021  ? ?Lab Results  ?Component Value Date  ? NA 137 08/25/2021  ? K 4.1 08/25/2021  ? CL 101 08/25/2021  ? CO2 28 08/25/2021  ? ? ?  ?  ? Relevant Orders  ? Comprehensive metabolic panel  ? Microalbumin / creatinine urine ratio  ?  Obesity, diabetes, and hypertension syndrome ([place])  ?  Currently uncontrolled on regimen of metformin, mounjaro.  glipizide was stopped when GLP 1 agonist was started and may need to be resumed short term until mounjaro dose can be increased . Patient  will check BS 1-2 times daily over the next month to provide data ?  ?  ? Relevant Medications  ? tirzepatide [facility]) 5 MG/0.5ML Pen  ? Diabetes mellitus with proteinuric diabetic nephropathy ([place])  ? Relevant Medications  ? tirzepatide [facility]) 5 MG/0.5ML Pen  ? Other Relevant Orders  ? Comprehensive metabolic panel  ? Hemoglobin A1c  ? Microalbumin / creatinine urine ratio  ? ?Other Visit Diagnoses   ? ? Uncontrolled type 2 diabetes mellitus with hyperglycemia ([place])      ? Relevant Medications  ? tirzepatide [facility]) 5 MG/0.5ML Pen  ? ?  ? ? ?I spent 30 minutes dedicated to the care of this patient on the date of this encounter to include pre-visit review of patient's medical history,  most recent  imaging studies, Face-to-face time with the patient , and post visit ordering of testing and therapeutics.   ? ?Follow-up: Return in about 3 months (around 11/28/2021) for follow up diabetes. ? ? ?Crecencio Mc, MD ?

## 2021-08-30 NOTE — Assessment & Plan Note (Addendum)
Currently uncontrolled on regimen of metformin, mounjaro.  glipizide was stopped when GLP 1 agonist was started and may need to be resumed short term until mounjaro dose can be increased . Patient  will check BS 1-2 times daily over the next month to provide data ?

## 2021-08-30 NOTE — Assessment & Plan Note (Addendum)
Well controlled on current regimen of telmisartan and hctz. Renal function stable, no changes today. ? ?Lab Results  ?Component Value Date  ? CREATININE 1.02 08/25/2021  ? ?Lab Results  ?Component Value Date  ? NA 137 08/25/2021  ? K 4.1 08/25/2021  ? CL 101 08/25/2021  ? CO2 28 08/25/2021  ? ? ?

## 2021-08-30 NOTE — Assessment & Plan Note (Signed)
Managed with rosuvastatin without side effects.  LDL is at goal  ? ?Lab Results  ?Component Value Date  ? CHOL 127 08/25/2021  ? HDL 32.90 (L) 08/25/2021  ? LDLCALC 108 02/05/2021  ? LDLDIRECT 53.0 08/25/2021  ? TRIG 209.0 (H) 08/25/2021  ? CHOLHDL 4 08/25/2021  ? ? ?

## 2021-08-31 ENCOUNTER — Ambulatory Visit: Payer: BC Managed Care – PPO | Admitting: Internal Medicine

## 2021-08-31 MED ORDER — OZEMPIC (1 MG/DOSE) 4 MG/3ML ~~LOC~~ SOPN
1.0000 mg | PEN_INJECTOR | SUBCUTANEOUS | 2 refills | Status: DC
Start: 2021-08-31 — End: 2021-10-05

## 2021-08-31 NOTE — Telephone Encounter (Signed)
PA was submitted and insurance stated that it is not needed because it is covered by [employer] plan. Called the pharmacy to let them know and they had the wrong insurance on file. They were able to pull the correct insurance and stated that they would get the rx filled for the pt. Spoke with pt to let her know.  ?

## 2021-08-31 NOTE — Telephone Encounter (Signed)
Your pharmacy has requested a change from [facility] back to Ozempic which Is fine but we keep losing ground if we do not advance the dose.  I sent in the pen that delivers the 1 mg dose since you  still have a few doses left of the mounjaro . ? ? ?For now please double your dose of mounjaro,  as discussed ,  to make sure you can tolerate the higher dose.  If you do,  then you should be Able to  advance to the 1 mg ozempic dose when you finish your current supply of mounjaro.  ?

## 2021-09-04 ENCOUNTER — Telehealth: Payer: Self-pay | Admitting: Internal Medicine

## 2021-09-04 NOTE — Telephone Encounter (Signed)
Attempted to schedule AWV. Unable to LVM.  Will try at later time.  

## 2021-09-16 ENCOUNTER — Telehealth: Payer: Self-pay | Admitting: Internal Medicine

## 2021-09-16 NOTE — Telephone Encounter (Signed)
Patient called and would like a phone call back from Dr Lupita Dawn nurse. She has questions about her Mounjaro, and would not give details.  ?

## 2021-09-24 NOTE — Telephone Encounter (Signed)
Spoke with pt and she was wanting to know if we had any samples of Mounjaro because she will be in the [place]ut [place] next month. I let pt know that we do not have any samples. Pt gave a verbal understanding.  ?

## 2021-10-05 ENCOUNTER — Telehealth: Payer: Self-pay | Admitting: Internal Medicine

## 2021-10-05 MED ORDER — TIRZEPATIDE 5 MG/0.5ML ~~LOC~~ SOAJ: 5.0000 mg | SUBCUTANEOUS | 3 refills | Status: DC

## 2021-10-05 NOTE — Telephone Encounter (Signed)
Pt does not want to change to Ozempic and would like a refill on the Mounjaro 5 mg, she does not want to increase her dose. Is it okay to refill?  ?

## 2021-10-05 NOTE — Telephone Encounter (Signed)
The patient is requesting a refill on her Mounjaro 5mg  . She does not want to go up at this time.  ?

## 2021-10-05 NOTE — Telephone Encounter (Signed)
Mounjaro refilled at 5 mg weekly  ?

## 2021-10-06 NOTE — Telephone Encounter (Signed)
Attempted to call pt. No answer no voicemail.  

## 2021-10-06 NOTE — Telephone Encounter (Signed)
Pt is aware.  

## 2021-10-13 ENCOUNTER — Telehealth: Payer: BC Managed Care – PPO

## 2021-10-14 ENCOUNTER — Telehealth: Payer: Self-pay | Admitting: Internal Medicine

## 2021-10-14 DIAGNOSIS — R102 Pelvic and perineal pain: Secondary | ICD-10-CM

## 2021-10-14 DIAGNOSIS — R232 Flushing: Secondary | ICD-10-CM | POA: Diagnosis not present

## 2021-10-14 NOTE — Telephone Encounter (Signed)
Pt called stating she feels like she needs to see a gynecologist and wants a referral  ?

## 2021-10-16 ENCOUNTER — Encounter: Payer: Self-pay | Admitting: Internal Medicine

## 2021-10-16 NOTE — Telephone Encounter (Signed)
Spoke with pt and she stated that she would like a referral to a GYN for monthly pelvic pain. She stated that it feels like she is having a menstrual cycle but she doesn't have those anymore.  ?

## 2021-10-16 NOTE — Telephone Encounter (Signed)
Referral to [facility] in process ? ?

## 2021-10-19 NOTE — Telephone Encounter (Signed)
Pt is aware that referral has been placed. Pt was advised that if she does not hear from their office in a week to let us know so we can check on the referral.  ?

## 2021-10-20 ENCOUNTER — Telehealth: Payer: Self-pay | Admitting: Internal Medicine

## 2021-10-20 NOTE — Telephone Encounter (Signed)
Attempted to schedule AWV. Unable to LVM.  Will try at later time.  

## 2021-10-28 ENCOUNTER — Telehealth: Payer: Self-pay | Admitting: Internal Medicine

## 2021-10-28 NOTE — Telephone Encounter (Signed)
Placed in results folder.  

## 2021-10-28 NOTE — Telephone Encounter (Signed)
Patient dropped off blood sugar readings. Readings are up front in Dr Tullo's color folder. 

## 2021-11-02 ENCOUNTER — Telehealth: Payer: Self-pay | Admitting: Internal Medicine

## 2021-11-02 MED ORDER — TIRZEPATIDE 7.5 MG/0.5ML ~~LOC~~ SOAJ: 7.5000 mg | SUBCUTANEOUS | 2 refills | Status: DC

## 2021-11-02 NOTE — Telephone Encounter (Signed)
Pt has not heard from the OBGYN in referral that was placed.  ?

## 2021-11-02 NOTE — Telephone Encounter (Signed)
Attempted to call pt no answer  

## 2021-11-02 NOTE — Telephone Encounter (Signed)
Mounjaro increased to 7,5 mg dose and refilled for 90  days ?

## 2021-11-02 NOTE — Addendum Note (Signed)
Addended by: Sherlene Shams on: 11/02/2021 12:56 PM ? ? Modules accepted: Orders ? ?

## 2021-11-02 NOTE — Telephone Encounter (Signed)
Patient is requesting a prescription for 7.5 Mounjaro, and can she have more than one refill, since there is a shortage on it. She states she needs it refilled asap. ? ?Patient also wanted to let Dr Derrel Nip know that she never heard back from her OBGYN. ?

## 2021-11-03 NOTE — Telephone Encounter (Signed)
Pt called in stating the provider sent in 10 refills for 7.5  and she did not want that. Pt would like to be called  because pt states when its time to get a refill she has to call it in and she does not want to do that ?

## 2021-11-04 NOTE — Telephone Encounter (Signed)
Rx was sent in with 2 refills not 10 refills. Pt is aware.  ?

## 2021-11-05 NOTE — Telephone Encounter (Signed)
noted 

## 2021-11-06 NOTE — Telephone Encounter (Signed)
Pt is aware that sugar reading are at goal.

## 2021-11-26 ENCOUNTER — Ambulatory Visit (INDEPENDENT_AMBULATORY_CARE_PROVIDER_SITE_OTHER): Payer: Medicare Other

## 2021-11-26 VITALS — Ht 61.0 in | Wt 192.0 lb

## 2021-11-26 DIAGNOSIS — Z0000 Encounter for general adult medical examination without abnormal findings: Secondary | ICD-10-CM | POA: Diagnosis not present

## 2021-11-26 NOTE — Patient Instructions (Addendum)
  Penny Butler , Thank you for taking time to come for your Medicare Wellness Visit. I appreciate your ongoing commitment to your health goals. Please review the following plan we discussed and let me know if I can assist you in the future.   These are the goals we discussed:  Goals      Follow up with cataract removal     Follow up with Primary Care Provider     Routine maintenance        This is a list of the screening recommended for you and due dates:  Health Maintenance  Topic Date Due   COVID-19 Vaccine (3 - Booster for [employer] series) 12/12/2021*   Pneumonia Vaccine (1 - PCV) 11/27/2022*   Tetanus Vaccine  11/27/2022*   Mammogram  01/13/2022   Flu Shot  01/19/2022   Eye exam for diabetics  02/10/2022   Hemoglobin A1C  02/25/2022   Complete foot exam   03/05/2022   Colon Cancer Screening  12/25/2023   DEXA scan (bone density measurement)  Completed   Hepatitis C Screening: USPSTF Recommendation to screen - Ages 9-79 yo.  Completed   Zoster (Shingles) Vaccine  Completed   HPV Vaccine  Aged Out  *Topic was postponed. The date shown is not the original due date.

## 2021-11-26 NOTE — Progress Notes (Addendum)
Subjective:   Penny Butler is a 71 y.o. female who presents for Medicare Annual (Subsequent) preventive examination.  Review of Systems    No ROS.  Medicare Wellness Virtual Visit.  Visual/audio telehealth visit, UTA vital signs.   See social history for additional risk factors.   Cardiac Risk Factors include: advanced age (>36men, >56 women);diabetes mellitus;hypertension     Objective:    Today's Vitals   11/26/21 1016  Weight: 192 lb (87.1 kg)  Height:  (1.549 m)   Body mass index is 36.28 kg/m.     11/26/2021   10:20 AM 07/28/2020   10:28 AM 07/26/2019    1:13 PM  Advanced Directives  Does Patient Have a Medical Advance Directive? No No No  Would patient like information on creating a medical advance directive? No - Patient declined No - Patient declined No - Patient declined    Current Medications (verified) Outpatient Encounter Medications as of 11/26/2021  Medication Sig   hydrochlorothiazide (HYDRODIURIL) 25 MG tablet TAKE 1 TABLET (25 MG TOTAL) BY MOUTH DAILY.   metFORMIN (GLUCOPHAGE-XR) 500 MG 24 hr tablet TAKE 2 TABLETS BY MOUTH EVERY DAY WITH BREAKFAST   ondansetron (ZOFRAN) 4 MG tablet Take 1 tablet (4 mg total) by mouth every 8 (eight) hours as needed for nausea or vomiting.   OneTouch Delica Lancets 30G MISC USE AS DIRECTED EVERY DAY   ONETOUCH VERIO test strip USE AS DIRECTED ONCE PER DAY   rosuvastatin (CRESTOR) 5 MG tablet TAKE 1 TABLET BY MOUTH EVERY DAY   telmisartan (MICARDIS) 40 MG tablet TAKE 1 TABLET BY MOUTH EVERYDAY AT BEDTIME   tirzepatide (MOUNJARO) 7.5 MG/0.5ML Pen Inject 7.5 mg into the skin once a week.   No facility-administered encounter medications on file as of 11/26/2021.    Allergies (verified) Citalopram, Codeine, and Tacrine   History: Past Medical History:  Diagnosis Date   Hyperlipidemia    Hypertension    Obesity    OSA (obstructive sleep apnea)    now off of CAPAP without symptoms    Type 2 diabetes mellitus (HCC)     Past Surgical History:  Procedure Laterality Date   ABDOMINAL HYSTERECTOMY     BILATERAL OOPHORECTOMY  2007   [place]   Family History  Problem Relation Age of Onset   Breast cancer Sister 29   Heart disease Mother    Lung cancer Mother    Heart disease Father 76   Lung cancer Father    Social History   Socioeconomic History   Marital status: Married    Spouse name: Not on file   Number of children: Not on file   Years of education: Not on file   Highest education level: Not on file  Occupational History   Not on file  Tobacco Use   Smoking status: Never   Smokeless tobacco: Never  Vaping Use   Vaping Use: Former  Substance and Sexual Activity   Alcohol use: Not Currently    Comment: Few drinks a year   Drug use: No   Sexual activity: Yes  Other Topics Concern   Not on file  Social History Narrative   Not on file   Social Determinants of Health   Financial Resource Strain: Low Risk  (11/26/2021)   Overall Financial Resource Strain (CARDIA)    Difficulty of Paying Living Expenses: Not hard at all  Food Insecurity: No Food Insecurity (11/26/2021)   Hunger Vital Sign    Worried About Running Out  of Food in the Last Year: Never true    Ran Out of Food in the Last Year: Never true  Transportation Needs: No Transportation Needs (11/26/2021)   PRAPARE - Administrator, Civil Service (Medical): No    Lack of Transportation (Non-Medical): No  Physical Activity: Sufficiently Active (11/26/2021)   Exercise Vital Sign    Days of Exercise per Week: 7 days    Minutes of Exercise per Session: 30 min  Stress: No Stress Concern Present (11/26/2021)   [employer] of Occupational Health - Occupational Stress Questionnaire    Feeling of Stress : Not at all  Social Connections: Unknown (11/26/2021)   Social Connection and Isolation Panel [NHANES]    Frequency of Communication with Friends and Family: Not on file    Frequency of Social Gatherings with Friends and  Family: Not on file    Attends Religious Services: Not on file    Active Member of Clubs or Organizations: Not on file    Attends Banker Meetings: Not on file    Marital Status: Married    Tobacco Counseling Counseling given: Not Answered   Clinical Intake:  Pre-visit preparation completed: Yes        Diabetes: Yes (Followed by PCP)  How often do you need to have someone help you when you read instructions, pamphlets, or other written materials from your doctor or pharmacy?: 1 - Never  Interpreter Needed?: No      Activities of Daily Living    11/26/2021   10:21 AM  In your present state of health, do you have any difficulty performing the following activities:  Hearing? 0  Vision? 0  Difficulty concentrating or making decisions? 0  Walking or climbing stairs? 0  Dressing or bathing? 0  Doing errands, shopping? 0  Preparing Food and eating ? N  Using the Toilet? N  In the past six months, have you accidently leaked urine? N  Do you have problems with loss of bowel control? N  Managing your Medications? N  Managing your Finances? N  Housekeeping or managing your Housekeeping? N    Patient Care Team: Sherlene Shams, MD as PCP - General (Internal Medicine)  Indicate any recent Medical Services you may have received from other than [facility] providers in the past year (date may be approximate).     Assessment:   This is a routine wellness examination for Penny Butler.  Virtual Visit via Telephone Note  I connected with  [name] on 11/26/21 at  9:00 AM EDT by telephone and verified that I am speaking with the correct person using two identifiers.  Persons participating in the virtual visit: patient/Nurse Health Advisor   I discussed the limitations of performing an evaluation and management service by telehealth. We continued and completed visit with audio only. Some vital signs may be absent or patient reported.   Hearing/Vision screen Hearing  Screening - Comments:: Patient is able to hear conversational tones without difficulty.  No issues reported.  Vision Screening - Comments:: Wears corrective lenses  They have seen their ophthalmologist in the last 12 months.   Dietary issues and exercise activities discussed: Current Exercise Habits: Home exercise routine, Type of exercise: strength training/weights;walking;calisthenics, Time (Minutes): 30, Frequency (Times/Week): 7, Weekly Exercise (Minutes/Week): 210, Intensity: Mild Healthy diet   Goals Addressed             This Visit's Progress    Follow up with cataract removal  Depression Screen    11/26/2021   10:19 AM 08/28/2021    2:07 PM 03/05/2021   12:04 PM 07/28/2020   10:27 AM 07/26/2019    1:15 PM 01/16/2018    2:12 PM  PHQ 2/9 Scores  PHQ - 2 Score 0 0 0 0 0 0  PHQ- 9 Score      0    Fall Risk    11/26/2021   10:21 AM 08/28/2021    2:07 PM 03/05/2021   12:03 PM 12/17/2020   10:57 AM 08/22/2020    3:58 PM  Fall Risk   Falls in the past year? 0 0 0 0 0  Number falls in past yr:   0 0   Injury with Fall?   0 0   Risk for fall due to :  No Fall Risks No Fall Risks    Follow up Falls evaluation completed Falls evaluation completed Falls evaluation completed Falls evaluation completed Falls evaluation completed    FALL RISK PREVENTION PERTAINING TO THE HOME: Home free of loose throw rugs in walkways, pet beds, electrical cords, etc? Yes  Adequate lighting in your home to reduce risk of falls? Yes   ASSISTIVE DEVICES UTILIZED TO PREVENT FALLS: Life alert? No  Use of a cane, walker or w/c? No   TIMED UP AND GO: Was the test performed? No .   Cognitive Function:  Patient is alert and oriented x3.       07/26/2019    1:30 PM  6CIT Screen  What Year? 0 points  What month? 0 points  What time? 0 points    Immunizations Immunization History  Administered Date(s) Administered   Influenza, High Dose Seasonal PF 05/05/2021   Janssen (J&J) SARS-COV-2  Vaccination 11/29/2019   PFIZER(Purple Top)SARS-COV-2 Vaccination 06/11/2020   Zoster Recombinat (Shingrix) 07/26/2018, 12/15/2018    TDAP status: Due, Education has been provided regarding the importance of this vaccine. Advised may receive this vaccine at local pharmacy or Health Dept. Aware to provide a copy of the vaccination record if obtained from local pharmacy or Health Dept. Verbalized acceptance and understanding.   Pneumococcal vaccine status: Due, Education has been provided regarding the importance of this vaccine. Advised may receive this vaccine at local pharmacy or Health Dept. Aware to provide a copy of the vaccination record if obtained from local pharmacy or Health Dept. Verbalized acceptance and understanding.  Screening Tests Health Maintenance  Topic Date Due   COVID-19 Vaccine (3 - Booster for Janssen series) 12/12/2021 (Originally 08/06/2020)   Pneumonia Vaccine 1265+ Years old (1 - PCV) 11/27/2022 (Originally 06/12/2018)   TETANUS/TDAP  11/27/2022 (Originally 06/12/1970)   MAMMOGRAM  01/13/2022   INFLUENZA VACCINE  01/19/2022   OPHTHALMOLOGY EXAM  02/10/2022   HEMOGLOBIN A1C  02/25/2022   FOOT EXAM  03/05/2022   COLONOSCOPY (Pts 45-2171yrs Insurance coverage will need to be confirmed)  12/25/2023   DEXA SCAN  Completed   Hepatitis C Screening  Completed   Zoster Vaccines- Shingrix  Completed   HPV VACCINES  Aged Out    Health Maintenance There are no preventive care reminders to display for this patient.  Lung Cancer Screening: (Low Dose CT Chest recommended if Age 44-80 years, 30 pack-year currently smoking OR have quit w/in 15years.) does not qualify.   Vision Screening: Recommended annual ophthalmology exams for early detection of glaucoma and other disorders of the eye.  Dental Screening: Recommended annual dental exams for proper oral hygiene  Community Resource Referral / Chronic  Care Management: CRR required this visit?  No   CCM required this visit?   No      Plan:   Keep all routine maintenance appointments.   I have personally reviewed and noted the following in the patient's chart:   Medical and social history Use of alcohol, tobacco or illicit drugs  Current medications and supplements including opioid prescriptions.  Functional ability and status Nutritional status Physical activity Advanced directives List of other physicians Hospitalizations, surgeries, and ER visits in previous 12 months Vitals Screenings to include cognitive, depression, and falls Referrals and appointments  In addition, I have reviewed and discussed with patient certain preventive protocols, quality metrics, and best practice recommendations. A written personalized care plan for preventive services as well as general preventive health recommendations were provided to patient.     OBrien-Blaney, Horace Wishon L, LPN   07/28/621       I have reviewed the above information and agree with above.   Duncan Dull, MD

## 2021-11-30 ENCOUNTER — Other Ambulatory Visit: Payer: Self-pay | Admitting: Internal Medicine

## 2021-11-30 DIAGNOSIS — Z1231 Encounter for screening mammogram for malignant neoplasm of breast: Secondary | ICD-10-CM

## 2021-12-01 ENCOUNTER — Other Ambulatory Visit (INDEPENDENT_AMBULATORY_CARE_PROVIDER_SITE_OTHER): Payer: Medicare Other

## 2021-12-01 DIAGNOSIS — E1121 Type 2 diabetes mellitus with diabetic nephropathy: Secondary | ICD-10-CM | POA: Diagnosis not present

## 2021-12-01 DIAGNOSIS — E1169 Type 2 diabetes mellitus with other specified complication: Secondary | ICD-10-CM

## 2021-12-01 DIAGNOSIS — I10 Essential (primary) hypertension: Secondary | ICD-10-CM

## 2021-12-01 DIAGNOSIS — E785 Hyperlipidemia, unspecified: Secondary | ICD-10-CM

## 2021-12-01 LAB — MICROALBUMIN / CREATININE URINE RATIO
Creatinine,U: 247.7 mg/dL
Microalb Creat Ratio: 0.8 mg/g (ref 0.0–30.0)
Microalb, Ur: 2.1 mg/dL — ABNORMAL HIGH (ref 0.0–1.9)

## 2021-12-01 LAB — COMPREHENSIVE METABOLIC PANEL
ALT: 19 U/L (ref 0–35)
AST: 17 U/L (ref 0–37)
Albumin: 4.1 g/dL (ref 3.5–5.2)
Alkaline Phosphatase: 52 U/L (ref 39–117)
BUN: 23 mg/dL (ref 6–23)
CO2: 28 mEq/L (ref 19–32)
Calcium: 10.3 mg/dL (ref 8.4–10.5)
Chloride: 103 mEq/L (ref 96–112)
Creatinine, Ser: 1.15 mg/dL (ref 0.40–1.20)
GFR: 48.96 mL/min — ABNORMAL LOW (ref >60.00)
Glucose, Bld: 109 mg/dL — ABNORMAL HIGH (ref 70–99)
Potassium: 3.9 mEq/L (ref 3.5–5.1)
Sodium: 138 mEq/L (ref 135–145)
Total Bilirubin: 0.3 mg/dL (ref 0.2–1.2)
Total Protein: 6.5 g/dL (ref 6.0–8.3)

## 2021-12-01 LAB — LIPID PANEL
Cholesterol: 181 mg/dL (ref 0–200)
HDL: 32.9 mg/dL — ABNORMAL LOW (ref >39.00)
NonHDL: 147.88
Total CHOL/HDL Ratio: 5
Triglycerides: 279 mg/dL — ABNORMAL HIGH (ref 0.0–149.0)
VLDL: 55.8 mg/dL — ABNORMAL HIGH (ref 0.0–40.0)

## 2021-12-01 LAB — HEMOGLOBIN A1C: Hgb A1c MFr Bld: 6.1 % (ref 4.6–6.5)

## 2021-12-01 LAB — LDL CHOLESTEROL, DIRECT: Direct LDL: 82 mg/dL

## 2021-12-03 ENCOUNTER — Telehealth: Payer: Self-pay | Admitting: Internal Medicine

## 2021-12-04 ENCOUNTER — Ambulatory Visit: Payer: Medicare Other | Admitting: Internal Medicine

## 2021-12-04 MED ORDER — ROSUVASTATIN CALCIUM 5 MG PO TABS: 5.0000 mg | ORAL_TABLET | Freq: Every day | ORAL | 3 refills | Status: DC

## 2021-12-04 NOTE — Addendum Note (Signed)
Addended by: Sandy Salaam on: 12/04/2021 11:52 AM   Modules accepted: Orders

## 2021-12-04 NOTE — Telephone Encounter (Signed)
Pt need refill on rosuvastatin sent to [facility]

## 2021-12-04 NOTE — Telephone Encounter (Signed)
Medication has been refilled.

## 2021-12-07 ENCOUNTER — Other Ambulatory Visit: Payer: Self-pay | Admitting: Internal Medicine

## 2021-12-08 ENCOUNTER — Encounter: Payer: Self-pay | Admitting: Internal Medicine

## 2021-12-08 ENCOUNTER — Ambulatory Visit (INDEPENDENT_AMBULATORY_CARE_PROVIDER_SITE_OTHER): Payer: Medicare Other | Admitting: Internal Medicine

## 2021-12-08 VITALS — BP 128/76 | HR 67 | Temp 98.1°F | Ht 61.0 in | Wt 173.0 lb

## 2021-12-08 DIAGNOSIS — E1121 Type 2 diabetes mellitus with diabetic nephropathy: Secondary | ICD-10-CM | POA: Diagnosis not present

## 2021-12-08 DIAGNOSIS — R944 Abnormal results of kidney function studies: Secondary | ICD-10-CM

## 2021-12-08 DIAGNOSIS — E1169 Type 2 diabetes mellitus with other specified complication: Secondary | ICD-10-CM

## 2021-12-08 DIAGNOSIS — E1122 Type 2 diabetes mellitus with diabetic chronic kidney disease: Secondary | ICD-10-CM | POA: Insufficient documentation

## 2021-12-08 DIAGNOSIS — I10 Essential (primary) hypertension: Secondary | ICD-10-CM | POA: Diagnosis not present

## 2021-12-08 DIAGNOSIS — E785 Hyperlipidemia, unspecified: Secondary | ICD-10-CM | POA: Diagnosis not present

## 2021-12-08 MED ORDER — TIRZEPATIDE 10 MG/0.5ML ~~LOC~~ SOAJ: 10.0000 mg | SUBCUTANEOUS | 2 refills | Status: DC

## 2021-12-08 NOTE — Patient Instructions (Addendum)
You look fantastic !    Stop the HCTZ for 3 weeks and return for blood test   No harm in being off of Crestor until July  Stop metformin

## 2021-12-08 NOTE — Progress Notes (Signed)
Subjective:  Patient ID: Penny Butler, female    DOB: [birth year]  Age: 71 y.o. MRN: [id number]  CC: The primary encounter diagnosis was Hyperlipidemia associated with type 2 diabetes mellitus (HCC). 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   HPI Penny Butler is a 71 yr old female with morbid obesity complicated by hypertension and type 2 D who presents for  Chief Complaint  Patient presents with   Follow-up   Morbid obesity : she has lost 27 lbs on Mounjaro  but she is now in the doughnut hole.  Her family is providing financial assistance so she can continue the medication.  She is taking the  7.5 mg dose and is on her last pen.  her weight  has plateaued and she would like to try the 10 mg dose   T2DM:    She  feels generally well,  is  exercising regularly and trying to lose weight. Checking  blood sugars less than once daily at variable times, usually only if she feels she may be having a hypoglycemic event. .  BS have been under 130 fasting and < 150 post prandially.  Denies any recent hypoglyemic events.  Taking   medications as directed. Following a carbohydrate modified diet 6 days per week. Denies numbness, burning and tingling of extremities. Appetite is diminished .  Hypertension: patient checks blood pressure twice weekly at home.  Readings have been for the most part <130/80 at rest . Patient is following a reduced salt diet most days and is taking medications as prescribed   Decreased GFR:  she is drinking > 60 ounces os water daily.  Does not take NSAIDS.  Taking  HCTZ   Outpatient Medications Prior to Visit  Medication Sig Dispense Refill   ondansetron (ZOFRAN) 4 MG tablet Take 1 tablet (4 mg total) by mouth every 8 (eight) hours as needed for nausea or vomiting. 20 tablet 0   OneTouch Delica Lancets 30G MISC USE AS DIRECTED EVERY DAY  100 each 5   ONETOUCH VERIO test strip USE AS DIRECTED ONCE PER DAY 100 strip 5   rosuvastatin (CRESTOR) 5 MG tablet Take 1 tablet (5 mg total) by mouth daily. 90 tablet 3   telmisartan (MICARDIS) 40 MG tablet TAKE 1 TABLET BY MOUTH EVERYDAY AT BEDTIME 90 tablet 1   hydrochlorothiazide (HYDRODIURIL) 25 MG tablet TAKE 1 TABLET (25 MG TOTAL) BY MOUTH DAILY. 90 tablet 1   metFORMIN (GLUCOPHAGE-XR) 500 MG 24 hr tablet TAKE 2 TABLETS BY MOUTH EVERY DAY WITH BREAKFAST 180 tablet 1   tirzepatide (MOUNJARO) 7.5 MG/0.5ML Pen Inject 7.5 mg into the skin once a week. 6 mL 2   No facility-administered medications prior to visit.    Review of Systems;  Patient denies headache, fevers, malaise, unintentional weight loss, skin rash, eye pain, sinus congestion and sinus pain, sore throat, dysphagia,  hemoptysis , cough, dyspnea, wheezing, chest pain, palpitations, orthopnea, edema, abdominal pain, nausea, melena, diarrhea, constipation, flank pain, dysuria, hematuria, urinary  Frequency, nocturia, numbness, tingling, seizures,  Focal weakness, Loss of consciousness,  Tremor, insomnia, depression, anxiety, and suicidal ideation.      Objective:  BP 128/76 (BP Location: Left Arm, Patient Position: Sitting, Cuff Size: Normal)   Pulse 67   Temp 98.1 F (36.7 C) (Oral)   Ht 5\' 1"  (1.549 m)   Wt 173 lb (78.5 kg)   SpO2  99%   BMI 32.69 kg/m   BP Readings from Last 3 Encounters:  12/08/21 128/76  08/28/21 130/82  03/05/21 138/82    Wt Readings from Last 3 Encounters:  12/08/21 173 lb (78.5 kg)  11/26/21 192 lb (87.1 kg)  08/28/21 192 lb 9.6 oz (87.4 kg)    General appearance: alert, cooperative and appears stated age Ears: normal TM's and external ear canals both ears Throat: lips, mucosa, and tongue normal; teeth and gums normal Neck: no adenopathy, no carotid bruit, supple, symmetrical, trachea midline and thyroid not enlarged, symmetric, no tenderness/mass/nodules Back: symmetric, no  curvature. ROM normal. No CVA tenderness. Lungs: clear to auscultation bilaterally Heart: regular rate and rhythm, S1, S2 normal, no murmur, click, rub or gallop Abdomen: soft, non-tender; bowel sounds normal; no masses,  no organomegaly Pulses: 2+ and symmetric Skin: Skin color, texture, turgor normal. No rashes or lesions Lymph nodes: Cervical, supraclavicular, and axillary nodes normal.  Lab Results  Component Value Date   HGBA1C 6.1 12/01/2021   HGBA1C 8.2 (H) 08/25/2021   HGBA1C 6.6 02/05/2021    Lab Results  Component Value Date   CREATININE 1.15 12/01/2021   CREATININE 1.02 08/25/2021   CREATININE 1.1 02/05/2021    Lab Results  Component Value Date   WBC 6.7 02/05/2021   HGB 13.6 02/05/2021   HCT 41 02/05/2021   PLT 295 02/05/2021   GLUCOSE 109 (H) 12/01/2021   CHOL 181 12/01/2021   TRIG 279.0 (H) 12/01/2021   HDL 32.90 (L) 12/01/2021   LDLDIRECT 82.0 12/01/2021   LDLCALC 108 02/05/2021   ALT 19 12/01/2021   AST 17 12/01/2021   NA 138 12/01/2021   K 3.9 12/01/2021   CL 103 12/01/2021   CREATININE 1.15 12/01/2021   BUN 23 12/01/2021   CO2 28 12/01/2021   TSH 1.77 02/05/2021   HGBA1C 6.1 12/01/2021   MICROALBUR 2.1 (H) 12/01/2021    MM 3D SCREEN BREAST BILATERAL  Result Date: 01/14/2021 CLINICAL DATA:  Screening. EXAM: DIGITAL SCREENING BILATERAL MAMMOGRAM WITH TOMOSYNTHESIS AND CAD TECHNIQUE: Bilateral screening digital craniocaudal and mediolateral oblique mammograms were obtained. Bilateral screening digital breast tomosynthesis was performed. The images were evaluated with computer-aided detection. COMPARISON:  Previous exam(s). ACR Breast Density Category b: There are scattered areas of fibroglandular density. FINDINGS: There are no findings suspicious for malignancy. IMPRESSION: No mammographic evidence of malignancy. A result letter of this screening mammogram will be mailed directly to the patient. RECOMMENDATION: Screening mammogram in one year.  (Code:SM-B-01Y) BI-RADS CATEGORY  1: Negative. Electronically Signed   By: Amie Portland M.D.   On: 01/14/2021 13:41   Assessment & Plan:   Problem List Items Addressed This Visit     Hyperlipidemia associated with type 2 diabetes mellitus (HCC) - Primary    Managed with Mounjaro , telmisartan and rosuvastatin without side effects.  LDL is at goal  Lab Results  Component Value Date   HGBA1C 6.1 12/01/2021     Lab Results  Component Value Date   CHOL 181 12/01/2021   HDL 32.90 (L) 12/01/2021   LDLCALC 108 02/05/2021   LDLDIRECT 82.0 12/01/2021   TRIG 279.0 (H) 12/01/2021   CHOLHDL 5 12/01/2021         Relevant Medications   tirzepatide (MOUNJARO) 10 MG/0.5ML Pen   Hypertension   Morbid obesity (HCC)    Complicated by hypertension and Type 2 DM.  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  Not taking NSAIDS      Relevant Orders   Basic metabolic panel    I spent a total of  25  minutes with this patient in a face to face visit on the date of this encounter reviewing the last office visit with me, patient's diet and eating habits, home blood pressure readings   and post visit ordering of testing and therapeutics.    Follow-up: Return in about 3 months (around 03/10/2022) for follow up diabetes.   Sherlene Shams, MD

## 2021-12-08 NOTE — Assessment & Plan Note (Addendum)
Complicated by hypertension and Type 2 DM.  Has lost 27 lbs since Sept 2022 using GLP 1 agonist Mounjaro currently at 7.5 mg dose , wants to increase dose to 10 mg

## 2021-12-08 NOTE — Assessment & Plan Note (Signed)
Will suspend hctz and repeat BMET.  Not taking NSAIDS

## 2021-12-08 NOTE — Assessment & Plan Note (Signed)
Managed with Mounjaro , telmisartan and rosuvastatin without side effects.  LDL is at goal  Lab Results  Component Value Date   HGBA1C 6.1 12/01/2021     Lab Results  Component Value Date   CHOL 181 12/01/2021   HDL 32.90 (L) 12/01/2021   LDLCALC 108 02/05/2021   LDLDIRECT 82.0 12/01/2021   TRIG 279.0 (H) 12/01/2021   CHOLHDL 5 12/01/2021

## 2021-12-25 ENCOUNTER — Telehealth: Payer: Self-pay | Admitting: Internal Medicine

## 2021-12-25 NOTE — Telephone Encounter (Signed)
Yes,  I will take on her daughter in law.  Clydie Braun can you give her a call and set her up?  thanks

## 2021-12-25 NOTE — Telephone Encounter (Signed)
Patient would like to know it Dr Darrick Huntsman will take her daughter-in-law as a new patient. Her name is Penny Butler, [phone].

## 2021-12-29 ENCOUNTER — Other Ambulatory Visit (INDEPENDENT_AMBULATORY_CARE_PROVIDER_SITE_OTHER): Payer: Medicare Other

## 2021-12-29 DIAGNOSIS — R944 Abnormal results of kidney function studies: Secondary | ICD-10-CM | POA: Diagnosis not present

## 2021-12-29 LAB — BASIC METABOLIC PANEL
BUN: 21 mg/dL (ref 6–23)
CO2: 27 mEq/L (ref 19–32)
Calcium: 9.6 mg/dL (ref 8.4–10.5)
Chloride: 108 mEq/L (ref 96–112)
Creatinine, Ser: 1.05 mg/dL (ref 0.40–1.20)
GFR: 54.58 mL/min — ABNORMAL LOW (ref >60.00)
Glucose, Bld: 87 mg/dL (ref 70–99)
Potassium: 4.2 mEq/L (ref 3.5–5.1)
Sodium: 140 mEq/L (ref 135–145)

## 2021-12-29 NOTE — Telephone Encounter (Signed)
Has pt's daughter in law been scheduled for a new pt appt yet?

## 2021-12-30 ENCOUNTER — Telehealth: Payer: Self-pay | Admitting: Internal Medicine

## 2021-12-30 DIAGNOSIS — E1121 Type 2 diabetes mellitus with diabetic nephropathy: Secondary | ICD-10-CM

## 2021-12-30 DIAGNOSIS — N1831 Chronic kidney disease, stage 3a: Secondary | ICD-10-CM

## 2021-12-30 NOTE — Telephone Encounter (Signed)
Patient called and wanted Dr Darrick Huntsman to know she read her note ,and yes she would like a referral to a Kidney specialist.

## 2021-12-30 NOTE — Telephone Encounter (Signed)
I have pended the referral for approval.  

## 2022-01-04 ENCOUNTER — Telehealth: Payer: Self-pay | Admitting: Internal Medicine

## 2022-01-04 NOTE — Telephone Encounter (Signed)
noted 

## 2022-01-04 NOTE — Telephone Encounter (Signed)
Patient is requesting a refill on tirzepatide [facility]) 10 MG/0.5ML Pen.  Please send to [facility].

## 2022-01-04 NOTE — Telephone Encounter (Signed)
Patient states she just discovered that Dr. Duncan Dull has already called in the prescription for tirzepatide [facility]) 10 MG/0.5ML Pen.  Patient states she would like for [place] to please disregard her previous message.

## 2022-01-14 DIAGNOSIS — I10 Essential (primary) hypertension: Secondary | ICD-10-CM | POA: Diagnosis not present

## 2022-01-14 DIAGNOSIS — G4733 Obstructive sleep apnea (adult) (pediatric): Secondary | ICD-10-CM | POA: Diagnosis not present

## 2022-01-15 ENCOUNTER — Other Ambulatory Visit: Payer: Self-pay | Admitting: Internal Medicine

## 2022-01-20 ENCOUNTER — Ambulatory Visit
Admission: RE | Admit: 2022-01-20 | Discharge: 2022-01-20 | Disposition: A | Discharge status: Home / Self Care | Payer: Medicare Other | Source: Ambulatory Visit | Attending: Internal Medicine | Admitting: Internal Medicine

## 2022-01-20 DIAGNOSIS — Z1231 Encounter for screening mammogram for malignant neoplasm of breast: Secondary | ICD-10-CM | POA: Diagnosis not present

## 2022-02-01 LAB — PROTEIN / CREATININE RATIO, URINE: Creatinine, Urine: 53

## 2022-02-01 LAB — MICROALBUMIN / CREATININE URINE RATIO: Microalb Creat Ratio: 0.053

## 2022-02-08 NOTE — Telephone Encounter (Signed)
Pt called in stating that mounjaro 10 and 12.5 mg is on back order at [facility]. Pt stated that they have 12.[facility] mg at [facility] and want the provider to send a prescription in

## 2022-02-09 MED ORDER — TIRZEPATIDE 12.5 MG/0.5ML ~~LOC~~ SOAJ: 12.5000 mg | SUBCUTANEOUS | 2 refills | Status: DC

## 2022-02-09 NOTE — Telephone Encounter (Signed)
12.5 mg maximum dose sent

## 2022-02-09 NOTE — Telephone Encounter (Signed)
Patient called about her Mounjaro. She really would like 108m supply. She is afraid she won't be able to get it. At this time she says [facility] in [place] has the [place].

## 2022-02-09 NOTE — Telephone Encounter (Signed)
Resent to [facility] in [place]. Pt is aware.

## 2022-02-09 NOTE — Telephone Encounter (Signed)
Pt is requesting that the 12.5 mg dose be sent to [facility] in [place] but pt is currently on the 10 mg dose.

## 2022-02-10 NOTE — Telephone Encounter (Signed)
Pt called stating she need PA for mounjaro. Pt would like to be called

## 2022-02-10 NOTE — Telephone Encounter (Signed)
Patient states [facility] in [place] has received her prescription for tirzepatide [facility]) 12.5 MG/0.5ML Pen, but they need a prior authorization.  Patient states she is really concerned that they will run out of this medicine before she can have her prescription filled.

## 2022-02-11 ENCOUNTER — Other Ambulatory Visit: Payer: Self-pay

## 2022-02-11 MED ORDER — TIRZEPATIDE 12.5 MG/0.5ML ~~LOC~~ SOAJ
12.5000 mg | SUBCUTANEOUS | 1 refills | Status: DC
Start: 2022-02-11 — End: 2022-05-10

## 2022-02-11 NOTE — Telephone Encounter (Signed)
Spoke with pt to let her know that the PA has been submitted and in not needed. Also called the pharmacy to let them know and they stated that they have the rx and will have it ready for pt to pick up tomorrow. Pt is aware.

## 2022-02-12 NOTE — Telephone Encounter (Signed)
PA has been resubmitted waiting on response from insurance.

## 2022-02-12 NOTE — Telephone Encounter (Signed)
Pt called stating she need PA from cover my meds for mounjaro. Pt would like to be called

## 2022-02-15 NOTE — Telephone Encounter (Signed)
Spoke with insurance company this morning and they stated that the medication is covered under her insurance. Called pharmacy back to let them know and they stated that when they run it through is says PA required. I called pt to find out if she had given [facility] her updated insurance information because that could be way it isn't going through on the pharmacy side. Pt stated that she had not given them her updated insurance information but go take it to them know.

## 2022-02-15 NOTE — Telephone Encounter (Addendum)
Pt called stated she is still waiting on her medication and she is upset. Pt stated she missed her dose on Saturday of her mounjaro. Pt stated she changed pharmacies and she told the cma. The cma was supposed to submit PA for the medication and the pt has been waiting on that since last week. Pt stated this is for her diabetes. Pt stated the cma kept telling her that it was going to be handled and its not. I told pt that the cma has submitted a PA to her insurance and its waiting on approval. Pt stated I'm not mad at you but Im mad at the situation. I told the pt I understand and I will get this information to the provider. Pt wanting to know if we have samples of mounjaro 5mg  and if so she need two of them. Pt would like to be called

## 2022-02-18 DIAGNOSIS — R5382 Chronic fatigue, unspecified: Secondary | ICD-10-CM | POA: Diagnosis not present

## 2022-02-24 ENCOUNTER — Encounter: Payer: Self-pay | Admitting: Internal Medicine

## 2022-02-24 DIAGNOSIS — R232 Flushing: Secondary | ICD-10-CM | POA: Diagnosis not present

## 2022-02-26 ENCOUNTER — Encounter: Payer: Self-pay | Admitting: Internal Medicine

## 2022-02-26 ENCOUNTER — Ambulatory Visit (INDEPENDENT_AMBULATORY_CARE_PROVIDER_SITE_OTHER): Payer: Medicare Other | Admitting: Internal Medicine

## 2022-02-26 VITALS — BP 126/80 | HR 90 | Temp 97.9°F | Resp 16 | Ht 61.0 in | Wt 164.2 lb

## 2022-02-26 DIAGNOSIS — E1159 Type 2 diabetes mellitus with other circulatory complications: Secondary | ICD-10-CM

## 2022-02-26 DIAGNOSIS — E785 Hyperlipidemia, unspecified: Secondary | ICD-10-CM

## 2022-02-26 DIAGNOSIS — E1169 Type 2 diabetes mellitus with other specified complication: Secondary | ICD-10-CM | POA: Diagnosis not present

## 2022-02-26 DIAGNOSIS — E1121 Type 2 diabetes mellitus with diabetic nephropathy: Secondary | ICD-10-CM

## 2022-02-26 DIAGNOSIS — N182 Chronic kidney disease, stage 2 (mild): Secondary | ICD-10-CM | POA: Diagnosis not present

## 2022-02-26 DIAGNOSIS — Z23 Encounter for immunization: Secondary | ICD-10-CM

## 2022-02-26 DIAGNOSIS — E669 Obesity, unspecified: Secondary | ICD-10-CM | POA: Diagnosis not present

## 2022-02-26 DIAGNOSIS — I152 Hypertension secondary to endocrine disorders: Secondary | ICD-10-CM

## 2022-02-26 DIAGNOSIS — E1122 Type 2 diabetes mellitus with diabetic chronic kidney disease: Secondary | ICD-10-CM

## 2022-02-26 NOTE — Progress Notes (Unsigned)
Subjective:  Patient ID: Penny Butler, female    DOB: 13-Mar-1951  Age: 71 y.o. MRN: [id number]  CC: The primary encounter diagnosis was Obesity, diabetes, and hypertension syndrome ([place]). Diagnoses of Hyperlipidemia associated with type 2 diabetes mellitus ([place]), Flu vaccine need, Diabetes mellitus with proteinuric diabetic nephropathy ([place]), and CKD stage 2 due to type 2 diabetes mellitus ([place]) were also pertinent to this visit.   HPI Penny Butler presents for follow up on type 2 DM with obesity and hypertension  Chief Complaint  Patient presents with   Follow-up    Follow up on diabetes Doing well  Blood sugars are running well per pt    1) Type 2 DM:  taking Mounjaro only,  currently on the 12.5 mg dose.   She  feels generally well,  and is  losing weight.  Tolerating medication without adverse effects.  Checking  blood sugars  once daily at variable times,   BS have been under 130 fasting and < 150 post prandially.  Denies any recent hypoglyemic events.  Taking   medications as directed. Following a carbohydrate modified diet 6 days per week. Denies numbness, burning and tingling of extremities. Appetite is diminished     2) CKD stage 2:   GFR has been < 60 ml/min since March 2023.  Patient concerned that the decreased GFR is due to use of simvastatin . Dispelled that notion,  but advised that she needs a higher potency statin due to concurrent dx of type 2 DM  Nephrology referral made in July.  Reviewed use of NSAIDs,  water intake etc   Lab Results  Component Value Date   HGBA1C 6.1 12/01/2021   3) HTN:  Hypertension: patient checks blood pressure twice weekly at home.  Readings have been for the most part < 130/80 at rest . Patient is following a reduced salt diet most days and is taking telmisartan as prescribed     Outpatient Medications Prior to Visit  Medication Sig Dispense Refill   OneTouch Delica Lancets 99991111 MISC USE AS DIRECTED EVERY DAY 100 each 5   ONETOUCH  VERIO test strip USE AS DIRECTED ONCE PER DAY 100 strip 5   rosuvastatin (CRESTOR) 5 MG tablet Take 1 tablet (5 mg total) by mouth daily. 90 tablet 3   telmisartan (MICARDIS) 40 MG tablet TAKE 1 TABLET BY MOUTH EVERYDAY AT BEDTIME 90 tablet 1   tirzepatide (MOUNJARO) 12.5 MG/0.5ML Pen Inject 12.5 mg into the skin once a week. 2 mL 1   ondansetron (ZOFRAN) 4 MG tablet Take 1 tablet (4 mg total) by mouth every 8 (eight) hours as needed for nausea or vomiting. (Patient not taking: Reported on 02/26/2022) 20 tablet 0   No facility-administered medications prior to visit.    Review of Systems;  Patient denies headache, fevers, malaise, unintentional weight loss, skin rash, eye pain, sinus congestion and sinus pain, sore throat, dysphagia,  hemoptysis , cough, dyspnea, wheezing, chest pain, palpitations, orthopnea, edema, abdominal pain, nausea, melena, diarrhea, constipation, flank pain, dysuria, hematuria, urinary  Frequency, nocturia, numbness, tingling, seizures,  Focal weakness, Loss of consciousness,  Tremor, insomnia, depression, anxiety, and suicidal ideation.      Objective:  BP 126/80   Pulse 90   Temp 97.9 F (36.6 C) (Oral)   Resp 16   Ht 5\' 1"  (1.549 m)   Wt 164 lb 4 oz (74.5 kg)   SpO2 99%   BMI 31.03 kg/m   BP Readings from Last  3 Encounters:  02/26/22 126/80  12/08/21 128/76  08/28/21 130/82    Wt Readings from Last 3 Encounters:  02/26/22 164 lb 4 oz (74.5 kg)  12/08/21 173 lb (78.5 kg)  11/26/21 192 lb (87.1 kg)    General appearance: alert, cooperative and appears stated age Ears: normal TM's and external ear canals both ears Throat: lips, mucosa, and tongue normal; teeth and gums normal Neck: no adenopathy, no carotid bruit, supple, symmetrical, trachea midline and thyroid not enlarged, symmetric, no tenderness/mass/nodules Back: symmetric, no curvature. ROM normal. No CVA tenderness. Lungs: clear to auscultation bilaterally Heart: regular rate and rhythm, S1,  S2 normal, no murmur, click, rub or gallop Abdomen: soft, non-tender; bowel sounds normal; no masses,  no organomegaly Pulses: 2+ and symmetric Skin: Skin color, texture, turgor normal. No rashes or lesions Lymph nodes: Cervical, supraclavicular, and axillary nodes normal. Neuro:  awake and interactive with normal mood and affect. Higher cortical functions are normal. Speech is clear without word-finding difficulty or dysarthria. Extraocular movements are intact. Visual fields of both eyes are grossly intact. Sensation to light touch is grossly intact bilaterally of upper and lower extremities. Motor examination shows 4+/5 symmetric hand grip and upper extremity and 5/5 lower extremity strength. There is no pronation or drift. Gait is non-ataxic   Lab Results  Component Value Date   HGBA1C 6.1 12/01/2021   HGBA1C 8.2 (H) 08/25/2021   HGBA1C 6.6 02/05/2021    Lab Results  Component Value Date   CREATININE 1.05 12/29/2021   CREATININE 1.15 12/01/2021   CREATININE 1.02 08/25/2021    Lab Results  Component Value Date   WBC 6.7 02/05/2021   HGB 13.6 02/05/2021   HCT 41 02/05/2021   PLT 295 02/05/2021   GLUCOSE 87 12/29/2021   CHOL 181 12/01/2021   TRIG 279.0 (H) 12/01/2021   HDL 32.90 (L) 12/01/2021   LDLDIRECT 82.0 12/01/2021   LDLCALC 108 02/05/2021   ALT 19 12/01/2021   AST 17 12/01/2021   NA 140 12/29/2021   K 4.2 12/29/2021   CL 108 12/29/2021   CREATININE 1.05 12/29/2021   BUN 21 12/29/2021   CO2 27 12/29/2021   TSH 1.77 02/05/2021   HGBA1C 6.1 12/01/2021   MICROALBUR 2.1 (H) 12/01/2021    MM 3D SCREEN BREAST BILATERAL  Result Date: 01/21/2022 CLINICAL DATA:  Screening. EXAM: DIGITAL SCREENING BILATERAL MAMMOGRAM WITH TOMOSYNTHESIS AND CAD TECHNIQUE: Bilateral screening digital craniocaudal and mediolateral oblique mammograms were obtained. Bilateral screening digital breast tomosynthesis was performed. The images were evaluated with computer-aided detection.  COMPARISON:  Previous exam(s). ACR Breast Density Category b: There are scattered areas of fibroglandular density. FINDINGS: There are no findings suspicious for malignancy. IMPRESSION: No mammographic evidence of malignancy. A result letter of this screening mammogram will be mailed directly to the patient. RECOMMENDATION: Screening mammogram in one year. (Code:SM-B-01Y) BI-RADS CATEGORY  1: Negative. Electronically Signed   By: Ted Mcalpine M.D.   On: 01/21/2022 14:15    Assessment & Plan:   Problem List Items Addressed This Visit     CKD stage 2 due to type 2 diabetes mellitus [facility])    Referral to nephrology under way.  Avoiding NSAIDs      Diabetes mellitus with proteinuric diabetic nephropathy (HCC)    Continue telmisartan.        Hyperlipidemia associated with type 2 diabetes mellitus (HCC)   Relevant Orders   Lipid panel   LDL cholesterol, direct   Obesity, diabetes, and hypertension syndrome ([facility]) -  Primary    Diagnosed in March with a1c of 8.2  . Marked improvement with use of mounjaro.  Net weight loss of 28 lbs since March and a1cis at goal. Microalbuminuria has improved with telmisartan.  Changing simvastatin to rosuvastatin.    Lab Results  Component Value Date   HGBA1C 6.1 12/01/2021   Lab Results  Component Value Date   MICROALBUR 2.1 (H) 12/01/2021   MICROALBUR 51.6 (H) 07/14/2020   Lab Results  Component Value Date   CREATININE 1.05 12/29/2021   Lab Results  Component Value Date   CHOL 181 12/01/2021   HDL 32.90 (L) 12/01/2021   LDLCALC 108 02/05/2021   LDLDIRECT 82.0 12/01/2021   TRIG 279.0 (H) 12/01/2021   CHOLHDL 5 12/01/2021           Relevant Orders   Hemoglobin A1c   Comprehensive metabolic panel   Other Visit Diagnoses     Flu vaccine need          Follow-up: Return in about 3 months (around 05/24/2022).   Sherlene Shams, MD

## 2022-02-26 NOTE — Patient Instructions (Signed)
You are doing remarkably well !   No changes today   Avoid motrin aleve, advil.  Use tylenol as needed for pain  Get your flu vaccine in mid October   Return in December

## 2022-02-28 NOTE — Assessment & Plan Note (Signed)
Diagnosed in March with a1c of 8.2  . Marked improvement with use of mounjaro.  Net weight loss of 28 lbs since March and a1cis at goal. Microalbuminuria has improved with telmisartan.  Changing simvastatin to rosuvastatin.    Lab Results  Component Value Date   HGBA1C 6.1 12/01/2021   Lab Results  Component Value Date   MICROALBUR 2.1 (H) 12/01/2021   MICROALBUR 51.6 (H) 07/14/2020   Lab Results  Component Value Date   CREATININE 1.05 12/29/2021   Lab Results  Component Value Date   CHOL 181 12/01/2021   HDL 32.90 (L) 12/01/2021   LDLCALC 108 02/05/2021   LDLDIRECT 82.0 12/01/2021   TRIG 279.0 (H) 12/01/2021   CHOLHDL 5 12/01/2021

## 2022-02-28 NOTE — Assessment & Plan Note (Signed)
Continue telmisartan. 

## 2022-02-28 NOTE — Assessment & Plan Note (Signed)
Referral to nephrology under way.  Avoiding NSAIDs

## 2022-03-01 MED ORDER — TIRZEPATIDE 10 MG/0.5ML ~~LOC~~ SOAJ: 10.0000 mg | SUBCUTANEOUS | 2 refills | Status: DC

## 2022-03-01 NOTE — Telephone Encounter (Signed)
Medication has been refilled and pt is aware.  

## 2022-03-01 NOTE — Addendum Note (Signed)
Addended by: Sandy Salaam on: 03/01/2022 04:50 PM   Modules accepted: Orders

## 2022-03-01 NOTE — Telephone Encounter (Signed)
Pt called stating the 12.5 is to much for her and she would like to go to the 10 mg. The [facility] [place] has only box left of the 10 mg. Pt states she would like the 28 day prescription and not the three month. Pt stated her insurance will cover it and she would like to know when it is sent in.

## 2022-03-01 NOTE — Telephone Encounter (Signed)
Is it okay to send in the 10 mg dose instead of the 12.5 mg? Pt stated that the 12.5 mg dose is "too much".

## 2022-03-17 ENCOUNTER — Other Ambulatory Visit: Payer: Self-pay | Admitting: Nephrology

## 2022-03-17 DIAGNOSIS — E1122 Type 2 diabetes mellitus with diabetic chronic kidney disease: Secondary | ICD-10-CM | POA: Diagnosis not present

## 2022-03-17 DIAGNOSIS — N1831 Chronic kidney disease, stage 3a: Secondary | ICD-10-CM | POA: Diagnosis not present

## 2022-03-17 DIAGNOSIS — I10 Essential (primary) hypertension: Secondary | ICD-10-CM | POA: Diagnosis not present

## 2022-03-29 ENCOUNTER — Ambulatory Visit
Admission: RE | Admit: 2022-03-29 | Discharge: 2022-03-29 | Disposition: A | Discharge status: Home / Self Care | Payer: Medicare Other | Source: Ambulatory Visit | Attending: Nephrology | Admitting: Nephrology

## 2022-03-29 DIAGNOSIS — N183 Chronic kidney disease, stage 3 unspecified: Secondary | ICD-10-CM | POA: Diagnosis not present

## 2022-03-29 DIAGNOSIS — E1122 Type 2 diabetes mellitus with diabetic chronic kidney disease: Secondary | ICD-10-CM | POA: Diagnosis not present

## 2022-03-29 DIAGNOSIS — N1831 Chronic kidney disease, stage 3a: Secondary | ICD-10-CM | POA: Insufficient documentation

## 2022-04-01 ENCOUNTER — Ambulatory Visit: Payer: Medicare Other

## 2022-04-11 ENCOUNTER — Other Ambulatory Visit: Payer: Self-pay | Admitting: Internal Medicine

## 2022-04-14 DIAGNOSIS — E1122 Type 2 diabetes mellitus with diabetic chronic kidney disease: Secondary | ICD-10-CM | POA: Diagnosis not present

## 2022-04-14 DIAGNOSIS — I10 Essential (primary) hypertension: Secondary | ICD-10-CM | POA: Diagnosis not present

## 2022-04-14 DIAGNOSIS — N1831 Chronic kidney disease, stage 3a: Secondary | ICD-10-CM | POA: Diagnosis not present

## 2022-04-19 ENCOUNTER — Encounter (INDEPENDENT_AMBULATORY_CARE_PROVIDER_SITE_OTHER): Payer: Self-pay

## 2022-05-10 ENCOUNTER — Telehealth: Payer: Self-pay | Admitting: Internal Medicine

## 2022-05-10 MED ORDER — TIRZEPATIDE 12.5 MG/0.5ML ~~LOC~~ SOAJ
12.5000 mg | SUBCUTANEOUS | 1 refills | Status: DC
Start: 2022-05-10 — End: 2022-08-06

## 2022-05-10 NOTE — Telephone Encounter (Signed)
Pt would like to be called in regards to her mounjaro because she is totally out. Pt would like to a refill on the 12 mg mounjaro for a 3 month supply per pharmacist.

## 2022-05-10 NOTE — Telephone Encounter (Signed)
Medication has been refilled.

## 2022-05-25 ENCOUNTER — Ambulatory Visit: Payer: Medicare Other | Admitting: Internal Medicine

## 2022-06-02 ENCOUNTER — Telehealth: Payer: Self-pay | Admitting: Internal Medicine

## 2022-06-02 ENCOUNTER — Ambulatory Visit (INDEPENDENT_AMBULATORY_CARE_PROVIDER_SITE_OTHER): Payer: Medicare Other | Admitting: Internal Medicine

## 2022-06-02 ENCOUNTER — Encounter: Payer: Self-pay | Admitting: Internal Medicine

## 2022-06-02 VITALS — BP 132/80 | HR 102 | Temp 98.2°F | Ht 61.0 in | Wt 147.4 lb

## 2022-06-02 DIAGNOSIS — E1169 Type 2 diabetes mellitus with other specified complication: Secondary | ICD-10-CM

## 2022-06-02 DIAGNOSIS — E1122 Type 2 diabetes mellitus with diabetic chronic kidney disease: Secondary | ICD-10-CM | POA: Diagnosis not present

## 2022-06-02 DIAGNOSIS — I10 Essential (primary) hypertension: Secondary | ICD-10-CM | POA: Diagnosis not present

## 2022-06-02 DIAGNOSIS — E785 Hyperlipidemia, unspecified: Secondary | ICD-10-CM | POA: Diagnosis not present

## 2022-06-02 DIAGNOSIS — E1121 Type 2 diabetes mellitus with diabetic nephropathy: Secondary | ICD-10-CM | POA: Diagnosis not present

## 2022-06-02 DIAGNOSIS — E1159 Type 2 diabetes mellitus with other circulatory complications: Secondary | ICD-10-CM | POA: Diagnosis not present

## 2022-06-02 DIAGNOSIS — I152 Hypertension secondary to endocrine disorders: Secondary | ICD-10-CM | POA: Diagnosis not present

## 2022-06-02 DIAGNOSIS — E669 Obesity, unspecified: Secondary | ICD-10-CM

## 2022-06-02 DIAGNOSIS — N182 Chronic kidney disease, stage 2 (mild): Secondary | ICD-10-CM

## 2022-06-02 LAB — COMPREHENSIVE METABOLIC PANEL
ALT: 15 U/L (ref 0–35)
AST: 17 U/L (ref 0–37)
Albumin: 4.3 g/dL (ref 3.5–5.2)
Alkaline Phosphatase: 73 U/L (ref 39–117)
BUN: 23 mg/dL (ref 6–23)
CO2: 28 mEq/L (ref 19–32)
Calcium: 10.2 mg/dL (ref 8.4–10.5)
Chloride: 105 mEq/L (ref 96–112)
Creatinine, Ser: 0.91 mg/dL (ref 0.40–1.20)
GFR: 64.61 mL/min (ref >60.00)
Glucose, Bld: 88 mg/dL (ref 70–99)
Potassium: 4.1 mEq/L (ref 3.5–5.1)
Sodium: 140 mEq/L (ref 135–145)
Total Bilirubin: 0.4 mg/dL (ref 0.2–1.2)
Total Protein: 6.7 g/dL (ref 6.0–8.3)

## 2022-06-02 LAB — HEMOGLOBIN A1C: Hgb A1c MFr Bld: 5.5 % (ref 4.6–6.5)

## 2022-06-02 LAB — LIPID PANEL
Cholesterol: 146 mg/dL (ref 0–200)
HDL: 37.2 mg/dL — ABNORMAL LOW (ref >39.00)
LDL Cholesterol: 82 mg/dL (ref 0–99)
NonHDL: 108.91
Total CHOL/HDL Ratio: 4
Triglycerides: 135 mg/dL (ref 0.0–149.0)
VLDL: 27 mg/dL (ref 0.0–40.0)

## 2022-06-02 LAB — LDL CHOLESTEROL, DIRECT: Direct LDL: 85 mg/dL

## 2022-06-02 NOTE — Patient Instructions (Addendum)
You look amazing!   Go get the TDaP vaccine at your pharmacy    Continue Mounjaro 12.5 mg weekly . You can increase the interval between doses to 10 days if you lose too much weight,  too quickly

## 2022-06-02 NOTE — Progress Notes (Unsigned)
Subjective:  Patient ID: [name], female    DOB: 1951/04/19  Age: 71 y.o. MRN: [id number]  CC: There were no encounter diagnoses.   HPI ROBECCA FULGHAM presents for follow up on type 2 DM with obesity and hypertension  Chief Complaint  Patient presents with   Medical Management of Chronic Issues    diabetes   1) obesity, HTN and diabetes:  taking mounjaro, currently on 12.5 mg dose.  Weight loss thus far is significant : (starting weight 200 in  Sept 2022 ).  Started 12.5 mg last week after a 4 month plateau on 10 mg dose.  Blood sugars have been  100 fasting .  And [post prandials  2) HTN:  does not chjck it at home.  Lat visit with nephrology was normal .  Today's elevated reading is due to anxiety and stress    Outpatient Medications Prior to Visit  Medication Sig Dispense Refill   OneTouch Delica Lancets 30G MISC USE AS DIRECTED EVERY DAY 100 each 5   ONETOUCH VERIO test strip USE AS DIRECTED ONCE PER DAY 100 strip 5   rosuvastatin (CRESTOR) 5 MG tablet Take 1 tablet (5 mg total) by mouth daily. 90 tablet 3   telmisartan (MICARDIS) 40 MG tablet TAKE 1 TABLET BY MOUTH EVERYDAY AT BEDTIME 90 tablet 1   tirzepatide (MOUNJARO) 12.5 MG/0.5ML Pen Inject 12.5 mg into the skin once a week. 2 mL 1   ondansetron (ZOFRAN) 4 MG tablet Take 1 tablet (4 mg total) by mouth every 8 (eight) hours as needed for nausea or vomiting. (Patient not taking: Reported on 02/26/2022) 20 tablet 0   tirzepatide (MOUNJARO) 10 MG/0.5ML Pen Inject 10 mg into the skin once a week. (Patient not taking: Reported on 06/02/2022) 2 mL 2   No facility-administered medications prior to visit.    Review of Systems;  Patient denies headache, fevers, malaise, unintentional weight loss, skin rash, eye pain, sinus congestion and sinus pain, sore throat, dysphagia,  hemoptysis , cough, dyspnea, wheezing, chest pain, palpitations, orthopnea, edema, abdominal pain, nausea, melena, diarrhea, constipation, flank  pain, dysuria, hematuria, urinary  Frequency, nocturia, numbness, tingling, seizures,  Focal weakness, Loss of consciousness,  Tremor, insomnia, depression, anxiety, and suicidal ideation.      Objective:  BP (!) 160/88   Pulse (!) 102   Temp 98.2 F (36.8 C) (Oral)   Ht 5\' 1"  (1.549 m)   Wt 147 lb 6.4 oz (66.9 kg)   SpO2 96%   BMI 27.85 kg/m   BP Readings from Last 3 Encounters:  06/02/22 (!) 160/88  02/26/22 126/80  12/08/21 128/76    Wt Readings from Last 3 Encounters:  06/02/22 147 lb 6.4 oz (66.9 kg)  02/26/22 164 lb 4 oz (74.5 kg)  12/08/21 173 lb (78.5 kg)    Physical Exam  Lab Results  Component Value Date   HGBA1C 6.1 12/01/2021   HGBA1C 8.2 (H) 08/25/2021   HGBA1C 6.6 02/05/2021    Lab Results  Component Value Date   CREATININE 1.05 12/29/2021   CREATININE 1.15 12/01/2021   CREATININE 1.02 08/25/2021    Lab Results  Component Value Date   WBC 6.7 02/05/2021   HGB 13.6 02/05/2021   HCT 41 02/05/2021   PLT 295 02/05/2021   GLUCOSE 87 12/29/2021   CHOL 181 12/01/2021   TRIG 279.0 (H) 12/01/2021   HDL 32.90 (L) 12/01/2021   LDLDIRECT 82.0 12/01/2021   LDLCALC 108 02/05/2021   ALT 19  12/01/2021   AST 17 12/01/2021   NA 140 12/29/2021   K 4.2 12/29/2021   CL 108 12/29/2021   CREATININE 1.05 12/29/2021   BUN 21 12/29/2021   CO2 27 12/29/2021   TSH 1.77 02/05/2021   HGBA1C 6.1 12/01/2021   MICROALBUR 2.1 (H) 12/01/2021    US RENAL  Result Date: 03/29/2022 CLINICAL DATA:  Stage III CKD EXAM: RENAL / URINARY TRACT ULTRASOUND COMPLETE COMPARISON:  CT abdomen pelvis 9/20 19 FINDINGS: Right Kidney: Renal measurements: 10.8 x 5.5 x 3.6 cm = volume: 173 mL. No mass or hydronephrosis visualized. Left Kidney: Renal measurements: 10.2 x 4.5 x 6.0 cm = volume: 144 mL. No mass or hydronephrosis visualized. Bladder: Appears normal for degree of bladder distention. Other: None. IMPRESSION: Unremarkable sonographic appearance of the bilateral kidneys.  Electronically Signed   By: Emmaline Kluver M.D.   On: 03/29/2022 14:03    Assessment & Plan:  .There are no diagnoses linked to this encounter.   I provided 30 minutes of face-to-face time during this encounter reviewing patient's last visit with me, patient's  most recent visit with cardiology,  nephrology,  and neurology,  recent surgical and non surgical procedures, previous  labs and imaging studies, counseling on currently addressed issues,  and post visit ordering to diagnostics and therapeutics .   Follow-up: No follow-ups on file.   Sherlene Shams, MD

## 2022-06-03 NOTE — Assessment & Plan Note (Signed)
Well controlled on current regimen of telmisartan and hctz. Renal function stable, no changes today.  Lab Results  Component Value Date   CREATININE 0.91 06/02/2022   Lab Results  Component Value Date   NA 140 06/02/2022   K 4.1 06/02/2022   CL 105 06/02/2022   CO2 28 06/02/2022

## 2022-06-03 NOTE — Assessment & Plan Note (Signed)
Repeat  PTH and ionized calcium were normal   Lab Results  Component Value Date   PTH 23 03/05/2021   CALCIUM 10.2 06/02/2022   CAION 5.45 03/05/2021

## 2022-06-03 NOTE — Assessment & Plan Note (Signed)
Referral to nephrology made.  She is avoiding NSAIDs  Avoiding NSAIDs  Lastcr Lab Results  Component Value Date   MICROALBUR 2.1 (H) 12/01/2021   MICROALBUR 51.6 (H) 07/14/2020    '

## 2022-06-03 NOTE — Assessment & Plan Note (Signed)
Complicated by hypertension and Type 2 DM.  Has lost 40 lbs since Sept using GLP 1 agonist Mounjaro and exercising regularly

## 2022-06-03 NOTE — Assessment & Plan Note (Signed)
Currently well-controlled on current medications .  hemoglobin A1c is at goal of less than 7.0 . Patient is reminded to schedule an annual eye exam and foot exam is normal today. Patient has no microalbuminuria. Patient is tolerating statin therapy for CAD risk reduction and on ACE/ARB for renal protection and hypertension  

## 2022-06-04 ENCOUNTER — Other Ambulatory Visit: Payer: Medicare Other

## 2022-06-09 NOTE — Telephone Encounter (Signed)
Error

## 2022-07-07 ENCOUNTER — Other Ambulatory Visit (HOSPITAL_COMMUNITY): Payer: Self-pay

## 2022-07-07 ENCOUNTER — Telehealth: Payer: Self-pay

## 2022-07-07 NOTE — Telephone Encounter (Signed)
Pharmacy Patient Advocate Encounter   Received notification from [facility] that prior authorization for Mounjaro 2.5mg /0.57ml is required/requested for renewal.  Key [id number]  PA was not submitted due to the patient is now on Mounjaro 12.5mg /0.76ml.

## 2022-07-07 NOTE — Telephone Encounter (Signed)
noted 

## 2022-07-08 NOTE — Telephone Encounter (Signed)
No

## 2022-07-22 DIAGNOSIS — H2513 Age-related nuclear cataract, bilateral: Secondary | ICD-10-CM | POA: Diagnosis not present

## 2022-07-22 DIAGNOSIS — H25013 Cortical age-related cataract, bilateral: Secondary | ICD-10-CM | POA: Diagnosis not present

## 2022-07-22 DIAGNOSIS — E119 Type 2 diabetes mellitus without complications: Secondary | ICD-10-CM | POA: Diagnosis not present

## 2022-07-22 LAB — HM DIABETES EYE EXAM

## 2022-08-06 MED ORDER — TIRZEPATIDE 12.5 MG/0.5ML ~~LOC~~ SOAJ: 12.5000 mg | SUBCUTANEOUS | 1 refills | Status: DC

## 2022-08-06 NOTE — Telephone Encounter (Signed)
Patient called and there is a back order on tirzepatide [facility]) 12.5 MG/0.5ML Pen. She is out and does not know what to do. Patient was advised to call [facility] to see if they have Mounjaro in stock.

## 2022-08-06 NOTE — Telephone Encounter (Signed)
Patient called back, [facility] pharmacy does not have her dosage. Patient would like to know if office has any samples.

## 2022-08-06 NOTE — Telephone Encounter (Signed)
We do not have any samples for the dose she is currently taking.

## 2022-08-06 NOTE — Telephone Encounter (Signed)
Spoke with pt and she stated that she spoke with [facility] and they will be able to fill the rx. Rx has been sent to them to be filled.

## 2022-08-06 NOTE — Addendum Note (Signed)
Addended by: Adair Laundry on: 08/06/2022 04:29 PM   Modules accepted: Orders

## 2022-08-09 ENCOUNTER — Other Ambulatory Visit: Payer: Self-pay

## 2022-08-09 ENCOUNTER — Telehealth: Payer: Self-pay

## 2022-08-09 MED ORDER — TIRZEPATIDE 10 MG/0.5ML ~~LOC~~ SOAJ
10.0000 mg | SUBCUTANEOUS | 0 refills | Status: DC
Start: 2022-08-09 — End: 2022-11-16
  Filled 2022-08-09: qty 2, 28d supply, fill #0
  Filled 2022-09-14 (×2): qty 2, 28d supply, fill #1
  Filled 2022-11-01 – 2022-11-03 (×3): qty 2, 28d supply, fill #2

## 2022-08-09 MED ORDER — TIRZEPATIDE 10 MG/0.5ML ~~LOC~~ SOAJ
10.0000 mg | SUBCUTANEOUS | 0 refills | Status: DC
  Filled 2022-08-09: qty 6, 84d supply, fill #0

## 2022-08-09 NOTE — Telephone Encounter (Signed)
Sent. Pt aware.  

## 2022-08-09 NOTE — Addendum Note (Signed)
Addended by: Adair Laundry on: 08/09/2022 01:48 PM   Modules accepted: Orders

## 2022-08-09 NOTE — Telephone Encounter (Signed)
Patient states she is following-up on her previous phone call.  Patient states she is concerned because of the difficulty she is having in getting this medication and she has an opportunity to get a one-month supply of Mounjaro 10MG.  Patient states she does not want to miss out on the the opportunity to get this medication.  Patient asks that we please call or send her a MyChart message when the prescription has been sent over to the [place].

## 2022-08-09 NOTE — Telephone Encounter (Signed)
Pt called in staying that the refill send on Friday its also out of stock, however, she will take Mounjaro 10Mg to be send over to [facility], as per pt, they have some over, she wants to know if this can be send over today. Pt would like a phone call when this is done.

## 2022-08-09 NOTE — Telephone Encounter (Signed)
Medication has been sent in and pt is aware.

## 2022-08-23 ENCOUNTER — Other Ambulatory Visit: Payer: Self-pay

## 2022-08-23 ENCOUNTER — Telehealth: Payer: Self-pay | Admitting: Internal Medicine

## 2022-08-23 MED ORDER — MOUNJARO 12.5 MG/0.5ML ~~LOC~~ SOAJ
12.5000 mg | SUBCUTANEOUS | 1 refills | Status: DC
  Filled 2022-08-23: qty 2, 28d supply, fill #0
  Filled 2022-09-14: qty 2, 28d supply, fill #1

## 2022-08-23 MED ORDER — MOUNJARO 12.5 MG/0.5ML ~~LOC~~ SOAJ
12.5000 mg | SUBCUTANEOUS | 1 refills | Status: DC
  Filled 2022-08-23: qty 2, 28d supply, fill #0

## 2022-08-23 NOTE — Telephone Encounter (Signed)
Patient called and [facility] pharmacy only could give her 1 month of tirzepatide (MOUNJARO) 12.5 MG/0.5ML Pen. [facility] pharmacy could not give her a 3 month supply because of the shortage. They told her it be April 30th until they get mor in. Patient doesn't know where she is going to go to next to get her tirzepatide [facility]) 12.5 MG/0.5ML Pen.

## 2022-08-23 NOTE — Addendum Note (Signed)
Addended by: Adair Laundry on: 08/23/2022 10:11 AM   Modules accepted: Orders

## 2022-08-23 NOTE — Telephone Encounter (Signed)
Attempted to call pt. No answer no voicemail. Need to let pt know that medicaiton has been sent to [facility].

## 2022-08-23 NOTE — Telephone Encounter (Signed)
Pt called stating her mounjaro script 12.5 mg is at [facility] but they can not fill it because they do not have the dose she need. Instead of the provider writing another script, [facility]. pt would like to be called

## 2022-08-23 NOTE — Telephone Encounter (Signed)
Attempted to call pt - no ans, no vm

## 2022-08-23 NOTE — Telephone Encounter (Signed)
S/w pt - ok with receiving 1 mo at a time. Will stick w/ [facility] for refills.

## 2022-09-14 ENCOUNTER — Other Ambulatory Visit: Payer: Self-pay

## 2022-09-21 ENCOUNTER — Other Ambulatory Visit: Payer: Self-pay

## 2022-09-23 ENCOUNTER — Other Ambulatory Visit: Payer: Self-pay

## 2022-09-24 ENCOUNTER — Telehealth: Payer: Self-pay | Admitting: Internal Medicine

## 2022-09-24 ENCOUNTER — Other Ambulatory Visit: Payer: Self-pay

## 2022-09-24 DIAGNOSIS — E1121 Type 2 diabetes mellitus with diabetic nephropathy: Secondary | ICD-10-CM

## 2022-09-24 MED ORDER — MOUNJARO 12.5 MG/0.5ML ~~LOC~~ SOAJ
12.5000 mg | SUBCUTANEOUS | 0 refills | Status: DC
  Filled 2022-09-24: qty 2, 28d supply, fill #0

## 2022-09-24 NOTE — Telephone Encounter (Addendum)
Pt called stating she need a refill on the 12.5 mounjaro sent to [facility] and pt also stated she has to get whatever they have in stock. Pt need a 3 month supply

## 2022-09-24 NOTE — Telephone Encounter (Signed)
Refill sent in for 90 days of the mounjaro to [facility] pharmacy.  Haya Hemler,cma

## 2022-09-28 ENCOUNTER — Other Ambulatory Visit: Payer: Self-pay

## 2022-10-01 ENCOUNTER — Other Ambulatory Visit: Payer: Self-pay

## 2022-10-20 DIAGNOSIS — H25813 Combined forms of age-related cataract, bilateral: Secondary | ICD-10-CM | POA: Diagnosis not present

## 2022-10-20 DIAGNOSIS — H35033 Hypertensive retinopathy, bilateral: Secondary | ICD-10-CM | POA: Diagnosis not present

## 2022-10-20 DIAGNOSIS — H25811 Combined forms of age-related cataract, right eye: Secondary | ICD-10-CM | POA: Diagnosis not present

## 2022-10-20 DIAGNOSIS — H52213 Irregular astigmatism, bilateral: Secondary | ICD-10-CM | POA: Diagnosis not present

## 2022-10-20 DIAGNOSIS — H524 Presbyopia: Secondary | ICD-10-CM | POA: Diagnosis not present

## 2022-10-20 DIAGNOSIS — H35361 Drusen (degenerative) of macula, right eye: Secondary | ICD-10-CM | POA: Diagnosis not present

## 2022-10-20 DIAGNOSIS — E119 Type 2 diabetes mellitus without complications: Secondary | ICD-10-CM | POA: Diagnosis not present

## 2022-10-22 ENCOUNTER — Telehealth: Payer: Self-pay | Admitting: Internal Medicine

## 2022-11-01 ENCOUNTER — Other Ambulatory Visit: Payer: Self-pay

## 2022-11-03 ENCOUNTER — Other Ambulatory Visit: Payer: Self-pay

## 2022-11-03 ENCOUNTER — Telehealth: Payer: Self-pay | Admitting: Internal Medicine

## 2022-11-03 DIAGNOSIS — E1121 Type 2 diabetes mellitus with diabetic nephropathy: Secondary | ICD-10-CM

## 2022-11-03 MED ORDER — MOUNJARO 12.5 MG/0.5ML ~~LOC~~ SOAJ
12.5000 mg | SUBCUTANEOUS | 0 refills | Status: DC
  Filled 2022-11-03: qty 6, 84d supply, fill #0

## 2022-11-03 NOTE — Telephone Encounter (Signed)
Medication has been refilled.

## 2022-11-03 NOTE — Telephone Encounter (Signed)
Danella from [employer] called stating pt need a refill on mounjaro 90 day sent to [facility]

## 2022-11-04 ENCOUNTER — Other Ambulatory Visit: Payer: Self-pay

## 2022-11-08 ENCOUNTER — Ambulatory Visit (INDEPENDENT_AMBULATORY_CARE_PROVIDER_SITE_OTHER): Payer: Medicare Other | Admitting: Internal Medicine

## 2022-11-08 ENCOUNTER — Encounter: Payer: Self-pay | Admitting: Internal Medicine

## 2022-11-08 VITALS — BP 129/85 | HR 104 | Temp 98.3°F | Ht 61.0 in | Wt 133.8 lb

## 2022-11-08 DIAGNOSIS — E1122 Type 2 diabetes mellitus with diabetic chronic kidney disease: Secondary | ICD-10-CM

## 2022-11-08 DIAGNOSIS — Z79899 Other long term (current) drug therapy: Secondary | ICD-10-CM

## 2022-11-08 DIAGNOSIS — I10 Essential (primary) hypertension: Secondary | ICD-10-CM

## 2022-11-08 DIAGNOSIS — I152 Hypertension secondary to endocrine disorders: Secondary | ICD-10-CM | POA: Diagnosis not present

## 2022-11-08 DIAGNOSIS — E785 Hyperlipidemia, unspecified: Secondary | ICD-10-CM | POA: Diagnosis not present

## 2022-11-08 DIAGNOSIS — E669 Obesity, unspecified: Secondary | ICD-10-CM

## 2022-11-08 DIAGNOSIS — E1159 Type 2 diabetes mellitus with other circulatory complications: Secondary | ICD-10-CM

## 2022-11-08 DIAGNOSIS — N182 Chronic kidney disease, stage 2 (mild): Secondary | ICD-10-CM

## 2022-11-08 DIAGNOSIS — Z0000 Encounter for general adult medical examination without abnormal findings: Secondary | ICD-10-CM | POA: Diagnosis not present

## 2022-11-08 DIAGNOSIS — E1169 Type 2 diabetes mellitus with other specified complication: Secondary | ICD-10-CM

## 2022-11-08 DIAGNOSIS — Z7985 Long-term (current) use of injectable non-insulin antidiabetic drugs: Secondary | ICD-10-CM | POA: Insufficient documentation

## 2022-11-08 LAB — CBC WITH DIFFERENTIAL/PLATELET
Basophils Absolute: 0.1 10*3/uL (ref 0.0–0.1)
Basophils Relative: 0.9 % (ref 0.0–3.0)
Eosinophils Absolute: 0.1 10*3/uL (ref 0.0–0.7)
Eosinophils Relative: 0.7 % (ref 0.0–5.0)
HCT: 39.9 % (ref 36.0–46.0)
Hemoglobin: 13.5 g/dL (ref 12.0–15.0)
Lymphocytes Relative: 29.2 % (ref 12.0–46.0)
Lymphs Abs: 2.1 10*3/uL (ref 0.7–4.0)
MCHC: 33.8 g/dL (ref 30.0–36.0)
MCV: 85.2 fl (ref 78.0–100.0)
Monocytes Absolute: 0.7 10*3/uL (ref 0.1–1.0)
Monocytes Relative: 9.4 % (ref 3.0–12.0)
Neutro Abs: 4.4 10*3/uL (ref 1.4–7.7)
Neutrophils Relative %: 59.8 % (ref 43.0–77.0)
Platelets: 316 10*3/uL (ref 150.0–400.0)
RBC: 4.68 Mil/uL (ref 3.87–5.11)
RDW: 12.9 % (ref 11.5–15.5)
WBC: 7.3 10*3/uL (ref 4.0–10.5)

## 2022-11-08 LAB — LIPID PANEL
Cholesterol: 165 mg/dL (ref 0–200)
HDL: 46.8 mg/dL (ref >39.00)
LDL Cholesterol: 97 mg/dL (ref 0–99)
NonHDL: 118.55
Total CHOL/HDL Ratio: 4
Triglycerides: 110 mg/dL (ref 0.0–149.0)
VLDL: 22 mg/dL (ref 0.0–40.0)

## 2022-11-08 LAB — COMPREHENSIVE METABOLIC PANEL
ALT: 14 U/L (ref 0–35)
AST: 18 U/L (ref 0–37)
Albumin: 4.1 g/dL (ref 3.5–5.2)
Alkaline Phosphatase: 69 U/L (ref 39–117)
BUN: 16 mg/dL (ref 6–23)
CO2: 25 mEq/L (ref 19–32)
Calcium: 10 mg/dL (ref 8.4–10.5)
Chloride: 106 mEq/L (ref 96–112)
Creatinine, Ser: 0.93 mg/dL (ref 0.40–1.20)
GFR: 62.76 mL/min (ref >60.00)
Glucose, Bld: 86 mg/dL (ref 70–99)
Potassium: 3.9 mEq/L (ref 3.5–5.1)
Sodium: 140 mEq/L (ref 135–145)
Total Bilirubin: 0.5 mg/dL (ref 0.2–1.2)
Total Protein: 6.9 g/dL (ref 6.0–8.3)

## 2022-11-08 LAB — MICROALBUMIN / CREATININE URINE RATIO
Creatinine,U: 79.8 mg/dL
Microalb Creat Ratio: 0.9 mg/g (ref 0.0–30.0)
Microalb, Ur: 0.7 mg/dL (ref 0.0–1.9)

## 2022-11-08 LAB — HEMOGLOBIN A1C: Hgb A1c MFr Bld: 5.2 % (ref 4.6–6.5)

## 2022-11-08 LAB — LDL CHOLESTEROL, DIRECT: Direct LDL: 97 mg/dL

## 2022-11-08 MED ORDER — ROSUVASTATIN CALCIUM 5 MG PO TABS: 5.0000 mg | ORAL_TABLET | Freq: Every day | ORAL | 3 refills | Status: DC

## 2022-11-08 NOTE — Assessment & Plan Note (Addendum)
Referral to nephrology made.  She is avoiding NSAIDs  and taking an ARB  Lastcr Lab Results  Component Value Date   MICROALBUR 2.1 (H) 12/01/2021   MICROALBUR 51.6 (H) 07/14/2020    '/

## 2022-11-08 NOTE — Patient Instructions (Addendum)
Your Goal for Blood pressure is 130/80   Please return for an RN visit AND BRING YOUR HOME BP MACHINE SO we can test it against ours  Your EKG is normal

## 2022-11-08 NOTE — Assessment & Plan Note (Signed)
She is anxious today due to elevated reading.  Home readings have been < 140/90 using a wrist cuff.  Patient to return with home BP cuff for RN VISIT FOR RECHECK.   Screening for proteinuria and LVH done today.  I have ordered and reviewed a 12 lead EKG and find that there are no acute changes and patient is in sinus rhythm.

## 2022-11-08 NOTE — Progress Notes (Signed)
Patient ID: Penny Butler, female    DOB: 25-May-1951  Age: 72 y.o. MRN: [id number]  The patient is here for annual Medicare wellness examination and management of other chronic and acute problems.   The risk factors are reflected in the social history.  The roster of all physicians providing medical care to patient - is listed in the Snapshot section of the chart.  Activities of daily living:  The patient is 100% independent in all ADLs: dressing, toileting, feeding as well as independent mobility  Home safety : The patient has smoke detectors in the home. They wear seatbelts.  There are no firearms at home. There is no violence in the home.   There is no risks for hepatitis, STDs or HIV. There is no   history of blood transfusion. They have no travel history to infectious disease endemic areas of the world.  The patient has seen their dentist in the last six month. They have seen their eye doctor in the last year. They admit to slight hearing difficulty with regard to whispered voices and some television programs.  They have deferred audiologic testing in the last year.  They do not  have excessive sun exposure. Discussed the need for sun protection: hats, long sleeves and use of sunscreen if there is significant sun exposure.   Diet: the importance of a healthy diet is discussed. They do have a healthy diet.  The benefits of regular aerobic exercise were discussed. She walks 4 times per week ,  20 minutes.   Depression screen: there are no signs or vegative symptoms of depression- irritability, change in appetite, anhedonia, sadness/tearfullness.  Cognitive assessment: the patient manages all their financial and personal affairs and is actively engaged. They could relate day,date,year and events; recalled 2/3 objects at 3 minutes; performed clock-face test normally.  The following portions of the patient's history were reviewed and updated as appropriate: allergies, current medications,  past family history, past medical history,  past surgical history, past social history  and problem list.  Visual acuity was not assessed per patient preference since she has regular follow up with her ophthalmologist. Hearing and body mass index were assessed and reviewed.   During the course of the visit the patient was educated and counseled about appropriate screening and preventive services including : fall prevention , diabetes screening, nutrition counseling, colorectal cancer screening, and recommended immunizations.    CC: The primary encounter diagnosis was Encounter for preventive health examination. Diagnoses of Obesity, diabetes, and hypertension syndrome (HCC), Hyperlipidemia associated with type 2 diabetes mellitus (HCC), Long-term use of high-risk medication, Elevated blood pressure reading in office with diagnosis of hypertension, CKD stage 2 due to type 2 diabetes mellitus (HCC), and Long-term current use of injectable noninsulin antidiabetic medication were also pertinent to this visit.   1)obesity DM:  she has lost 67 lbs on Mounjaro.  She has recently dropped dose to 10 mg to avoid losing more.    2) HTN:   Home BP readings reviewed  .  All are < 140/90  using a wrist cuff,   takes micardis 40 in the evening ,  does not take NSAIDs.  Does not use salt .     History Penny Butler has a past medical history of Hyperlipidemia, Hypertension, Obesity, OSA (obstructive sleep apnea), and Type 2 diabetes mellitus (HCC).   She has a past surgical history that includes Abdominal hysterectomy and Bilateral oophorectomy (2007).   Her family history includes Breast cancer (age of onset:  63) in her sister; Heart disease in her mother; Heart disease (age of onset: 36) in her father; Lung cancer in her father and mother.She reports that she has never smoked. She has never used smokeless tobacco. She reports that she does not currently use alcohol. She reports that she does not use  drugs.  Outpatient Medications Prior to Visit  Medication Sig Dispense Refill   OneTouch Delica Lancets 30G MISC USE AS DIRECTED EVERY DAY 100 each 5   ONETOUCH VERIO test strip USE AS DIRECTED ONCE PER DAY 100 strip 5   progesterone (PROMETRIUM) 200 MG capsule Take 200 mg by mouth at bedtime.     telmisartan (MICARDIS) 40 MG tablet TAKE 1 TABLET BY MOUTH EVERYDAY AT BEDTIME 90 tablet 1   tirzepatide (MOUNJARO) 10 MG/0.5ML Pen Inject 10 mg into the skin once a week. 6 mL 0   rosuvastatin (CRESTOR) 5 MG tablet Take 1 tablet (5 mg total) by mouth daily. 90 tablet 3   tirzepatide (MOUNJARO) 12.5 MG/0.5ML Pen Inject 12.5 mg into the skin once a week. (Patient not taking: Reported on 11/08/2022) 6 mL 0   No facility-administered medications prior to visit.    Review of Systems  Patient denies headache, fevers, malaise, unintentional weight loss, skin rash, eye pain, sinus congestion and sinus pain, sore throat, dysphagia,  hemoptysis , cough, dyspnea, wheezing, chest pain, palpitations, orthopnea, edema, abdominal pain, nausea, melena, diarrhea, constipation, flank pain, dysuria, hematuria, urinary  Frequency, nocturia, numbness, tingling, seizures,  Focal weakness, Loss of consciousness,  Tremor, insomnia, depression, anxiety, and suicidal ideation.     Objective:  BP 129/85   Pulse (!) 104   Temp 98.3 F (36.8 C) (Oral)   Ht 5\' 1"  (1.549 m)   Wt 133 lb 12.8 oz (60.7 kg)   SpO2 98%   BMI 25.28 kg/m   Physical Exam Vitals reviewed.  Constitutional:      General: She is not in acute distress.    Appearance: Normal appearance. She is well-developed and normal weight. She is not ill-appearing, toxic-appearing or diaphoretic.  HENT:     Head: Normocephalic.     Right Ear: Tympanic membrane, ear canal and external ear normal. There is no impacted cerumen.     Left Ear: Tympanic membrane, ear canal and external ear normal. There is no impacted cerumen.     Nose: Nose normal.      Mouth/Throat:     Mouth: Mucous membranes are moist.     Pharynx: Oropharynx is clear.  Eyes:     General: No scleral icterus.       Right eye: No discharge.        Left eye: No discharge.     Conjunctiva/sclera: Conjunctivae normal.     Pupils: Pupils are equal, round, and reactive to light.  Neck:     Thyroid: No thyromegaly.     Vascular: No carotid bruit or JVD.  Cardiovascular:     Rate and Rhythm: Normal rate and regular rhythm.     Heart sounds: Normal heart sounds.  Pulmonary:     Effort: Pulmonary effort is normal. No respiratory distress.     Breath sounds: Normal breath sounds.  Chest:  Breasts:    Breasts are symmetrical.     Right: Normal. No swelling, inverted nipple, mass, nipple discharge, skin change or tenderness.     Left: Normal. No swelling, inverted nipple, mass, nipple discharge, skin change or tenderness.  Abdominal:     General: Bowel sounds  are normal.     Palpations: Abdomen is soft. There is no mass.     Tenderness: There is no abdominal tenderness. There is no guarding or rebound.  Musculoskeletal:        General: Normal range of motion.     Cervical back: Normal range of motion and neck supple.  Lymphadenopathy:     Cervical: No cervical adenopathy.     Upper Body:     Right upper body: No supraclavicular, axillary or pectoral adenopathy.     Left upper body: No supraclavicular, axillary or pectoral adenopathy.  Skin:    General: Skin is warm and dry.  Neurological:     General: No focal deficit present.     Mental Status: She is alert and oriented to person, place, and time. Mental status is at baseline.  Psychiatric:        Mood and Affect: Mood normal.        Behavior: Behavior normal.        Thought Content: Thought content normal.        Judgment: Judgment normal.   Assessment & Plan:  Encounter for preventive health examination Assessment & Plan: age appropriate education and counseling updated, referrals for preventative services  and immunizations addressed, dietary and smoking counseling addressed, most recent labs reviewed.  I have personally reviewed and have noted:   1) the patient's medical and social history 2) The pt's use of alcohol, tobacco, and illicit drugs 3) The patient's current medications and supplements 4) Functional ability including ADL's, fall risk, home safety risk, hearing and visual impairment 5) Diet and physical activities 6) Evidence for depression or mood disorder 7) The patient's height, weight, and BMI have been recorded in the chart   I have made referrals, and provided counseling and education based on review of the above    Obesity, diabetes, and hypertension syndrome [facility]) Assessment & Plan: Diagnosed in March 2023  with a1c of 8.2  . Marked improvement with use of mounjaro.  Net weight loss of 67 lbs since  starting medicaiton March and A1c has been at  goal. Microalbuminuria has improved with telmisartan.  Continue  rosuvastatin.    Lab Results  Component Value Date   HGBA1C 5.5 06/02/2022   Lab Results  Component Value Date   MICROALBUR 2.1 (H) 12/01/2021   MICROALBUR 51.6 (H) 07/14/2020   Lab Results  Component Value Date   CREATININE 0.91 06/02/2022   Lab Results  Component Value Date   CHOL 146 06/02/2022   HDL 37.20 (L) 06/02/2022   LDLCALC 82 06/02/2022   LDLDIRECT 85.0 06/02/2022   TRIG 135.0 06/02/2022   CHOLHDL 4 06/02/2022       Orders: -     Microalbumin / creatinine urine ratio -     Hemoglobin A1c -     Comprehensive metabolic panel  Hyperlipidemia associated with type 2 diabetes mellitus (HCC) -     Lipid panel -     LDL cholesterol, direct  Long-term use of high-risk medication -     CBC with Differential/Platelet -     TSH  Elevated blood pressure reading in office with diagnosis of hypertension Assessment & Plan: She is anxious today due to elevated reading.  Home readings have been < 140/90 using a wrist cuff.  Patient to return  with home BP cuff for RN VISIT FOR RECHECK.   Screening for proteinuria and LVH done today.  I have ordered and reviewed a 12 lead EKG  and find that there are no acute changes and patient is in sinus rhythm.     Orders: -     EKG 12-Lead  CKD stage 2 due to type 2 diabetes mellitus [facility]) Assessment & Plan: Referral to nephrology made.  She is avoiding NSAIDs  and taking an ARB  Lastcr Lab Results  Component Value Date   MICROALBUR 2.1 (H) 12/01/2021   MICROALBUR 51.6 (H) 07/14/2020    '/   Long-term current use of injectable noninsulin antidiabetic medication Assessment & Plan: Agree with reduction in Mounjaro dose to 10 units today . She has lost 67 lbs thus far    Other orders -     Rosuvastatin Calcium; Take 1 tablet (5 mg total) by mouth daily.  Dispense: 90 tablet; Refill: 3      I provided 40 minutes of  face-to-face time during this encounter reviewing patient's current problems and past surgeries,  recent labs and imaging studies, providing counseling on the above mentioned problems , and coordination  of care .   Follow-up: Return in about 6 months (around 05/11/2023).   Sherlene Shams, MD

## 2022-11-08 NOTE — Assessment & Plan Note (Signed)
Agree with reduction in Mounjaro dose to 10 units today . She has lost 67 lbs thus far

## 2022-11-08 NOTE — Assessment & Plan Note (Signed)
Diagnosed in March 2023  with a1c of 8.2  . Marked improvement with use of mounjaro.  Net weight loss of 67 lbs since  starting medicaiton March and A1c has been at  goal. Microalbuminuria has improved with telmisartan.  Continue  rosuvastatin.    Lab Results  Component Value Date   HGBA1C 5.5 06/02/2022   Lab Results  Component Value Date   MICROALBUR 2.1 (H) 12/01/2021   MICROALBUR 51.6 (H) 07/14/2020   Lab Results  Component Value Date   CREATININE 0.91 06/02/2022   Lab Results  Component Value Date   CHOL 146 06/02/2022   HDL 37.20 (L) 06/02/2022   LDLCALC 82 06/02/2022   LDLDIRECT 85.0 06/02/2022   TRIG 135.0 06/02/2022   CHOLHDL 4 06/02/2022

## 2022-11-08 NOTE — Assessment & Plan Note (Signed)

## 2022-11-09 LAB — TSH: TSH: 1.91 u[IU]/mL (ref 0.35–5.50)

## 2022-11-16 MED ORDER — TIRZEPATIDE 10 MG/0.5ML ~~LOC~~ SOAJ
10.0000 mg | SUBCUTANEOUS | 0 refills | Status: DC
Start: 2022-11-16 — End: 2022-11-30

## 2022-11-16 NOTE — Addendum Note (Signed)
Addended by: Sandy Salaam on: 11/16/2022 02:25 PM   Modules accepted: Orders

## 2022-11-16 NOTE — Telephone Encounter (Signed)
[facility] [place]. Pt is aware.

## 2022-11-16 NOTE — Telephone Encounter (Signed)
[facility] [place]

## 2022-11-18 ENCOUNTER — Ambulatory Visit: Payer: Medicare Other

## 2022-11-29 ENCOUNTER — Telehealth: Payer: Self-pay | Admitting: Internal Medicine

## 2022-11-29 DIAGNOSIS — E1121 Type 2 diabetes mellitus with diabetic nephropathy: Secondary | ICD-10-CM

## 2022-11-29 NOTE — Telephone Encounter (Signed)
Pt called stating she would like for the cma to call her because she want the cma to send a three month supply of mounjaro 12.5mg  to the [facility] in [place]. Pt does not know what her copay will be and she want to know before they fill it

## 2022-11-30 ENCOUNTER — Other Ambulatory Visit: Payer: Self-pay

## 2022-11-30 MED ORDER — RYBELSUS 14 MG PO TABS
14.0000 mg | ORAL_TABLET | Freq: Every day | ORAL | 2 refills | Status: DC
  Filled 2022-11-30 – 2022-12-06 (×4): qty 30, 30d supply, fill #0

## 2022-11-30 MED ORDER — RYBELSUS 14 MG PO TABS: 14.0000 mg | ORAL_TABLET | Freq: Every day | ORAL | 2 refills | Status: DC

## 2022-11-30 MED ORDER — MOUNJARO 12.5 MG/0.5ML ~~LOC~~ SOAJ: 12.5000 mg | SUBCUTANEOUS | 2 refills | Status: DC

## 2022-11-30 NOTE — Telephone Encounter (Signed)
Spoke with pt to let her know that the Rybelsus has been sent to [facility]. Pt stated that she needs to rx sent to [facility]. I have resent the rx to [facility].

## 2022-11-30 NOTE — Telephone Encounter (Signed)
Pt called in to let Dt. Tullo knows that she will go Rybelsus now, but will get back to [facility] in January.

## 2022-11-30 NOTE — Telephone Encounter (Signed)
Rybelsus at maximal dose sent as altenrative to max dose mounajor

## 2022-11-30 NOTE — Telephone Encounter (Signed)
Pt called in to let Dr. Darrick Huntsman knows that she cannot afford Mounjaro anymore so she would like to change to Rybelsus??

## 2022-11-30 NOTE — Telephone Encounter (Signed)
Spoke with pt and she stated that she is in the donut hole right now and unable to afford a three month supply of the [facility]. Pt asked that per her last conversation with Dr. Darrick Huntsman that we send in the 12.5 mg dose for one month and she will stretch it out as directed by Dr. Darrick Huntsman. I have sent in the 12.5 mg dose for one month.

## 2022-12-01 ENCOUNTER — Other Ambulatory Visit: Payer: Self-pay

## 2022-12-02 ENCOUNTER — Telehealth: Payer: Self-pay | Admitting: Pharmacy Technician

## 2022-12-02 DIAGNOSIS — E1121 Type 2 diabetes mellitus with diabetic nephropathy: Secondary | ICD-10-CM

## 2022-12-02 NOTE — Telephone Encounter (Signed)
Pharmacy Patient Advocate Encounter   Received notification from CoverMyMeds that prior authorization for Rybelsus 14MG  tablets is required/requested.  PA submitted to [facility] via PA options: CoverMyMeds Key/confirmation #/EOC [id number] Status is pending

## 2022-12-03 ENCOUNTER — Other Ambulatory Visit: Payer: Self-pay

## 2022-12-04 NOTE — Telephone Encounter (Signed)
Patient Advocate Encounter  Prior Authorization for Rybelsus 14MG  tablets has been approved through [employer].    Key: [id number]  Effective: 12-02-2022 to 12-01-2025

## 2022-12-06 ENCOUNTER — Other Ambulatory Visit: Payer: Self-pay

## 2022-12-07 ENCOUNTER — Ambulatory Visit: Payer: Medicare Other | Admitting: Internal Medicine

## 2022-12-07 MED ORDER — MOUNJARO 12.5 MG/0.5ML ~~LOC~~ SOAJ: 12.5000 mg | SUBCUTANEOUS | 0 refills | Status: DC

## 2022-12-07 NOTE — Telephone Encounter (Signed)
Patient would like to stay on Mounjaro because Rybelsus has an ingredient that is also in Ozempic, that she can not take. She is so sorry for the back and forth decision. Please order her Mounjaro.

## 2022-12-07 NOTE — Telephone Encounter (Signed)
Rx for mounjaro 12.5 mg sent to pharmacy per patient request . Pls refer to her mychart

## 2022-12-07 NOTE — Telephone Encounter (Signed)
noted 

## 2022-12-09 ENCOUNTER — Ambulatory Visit: Payer: Medicare Other | Admitting: Internal Medicine

## 2022-12-13 ENCOUNTER — Other Ambulatory Visit: Payer: Self-pay | Admitting: Internal Medicine

## 2022-12-13 ENCOUNTER — Telehealth: Payer: Self-pay

## 2022-12-13 ENCOUNTER — Ambulatory Visit: Payer: Medicare Other

## 2022-12-13 ENCOUNTER — Other Ambulatory Visit: Payer: Self-pay

## 2022-12-13 DIAGNOSIS — H538 Other visual disturbances: Secondary | ICD-10-CM | POA: Diagnosis not present

## 2022-12-13 DIAGNOSIS — H2513 Age-related nuclear cataract, bilateral: Secondary | ICD-10-CM | POA: Diagnosis not present

## 2022-12-13 DIAGNOSIS — H04123 Dry eye syndrome of bilateral lacrimal glands: Secondary | ICD-10-CM | POA: Diagnosis not present

## 2022-12-13 DIAGNOSIS — E119 Type 2 diabetes mellitus without complications: Secondary | ICD-10-CM | POA: Diagnosis not present

## 2022-12-13 DIAGNOSIS — Z1231 Encounter for screening mammogram for malignant neoplasm of breast: Secondary | ICD-10-CM

## 2022-12-13 LAB — HM DIABETES EYE EXAM

## 2022-12-13 NOTE — Telephone Encounter (Signed)
Pt missed her appointment 12/13/22 for BP check.  Attempted to call and reschedule.  No answer and voicemail not set up.

## 2022-12-14 ENCOUNTER — Encounter: Payer: Self-pay | Admitting: Internal Medicine

## 2022-12-14 NOTE — Telephone Encounter (Signed)
noted 

## 2022-12-14 NOTE — Telephone Encounter (Signed)
Patient states she is returning our call.  Patient apologized, she states she has a lot going on with eye surgery coming up.  Patient states she thought her appointment was this coming Thursday (12/16/2022).  Patient's BP check appointment has been rescheduled for 12/21/2022.

## 2022-12-16 DIAGNOSIS — H2511 Age-related nuclear cataract, right eye: Secondary | ICD-10-CM | POA: Diagnosis not present

## 2022-12-16 DIAGNOSIS — H2512 Age-related nuclear cataract, left eye: Secondary | ICD-10-CM | POA: Diagnosis not present

## 2022-12-20 ENCOUNTER — Encounter: Payer: Self-pay | Admitting: Ophthalmology

## 2022-12-21 ENCOUNTER — Ambulatory Visit: Payer: Medicare Other

## 2022-12-21 DIAGNOSIS — I10 Essential (primary) hypertension: Secondary | ICD-10-CM

## 2022-12-21 NOTE — Anesthesia Preprocedure Evaluation (Addendum)
Anesthesia Evaluation  Patient identified by MRN, date of birth, ID band Patient awake    Reviewed: Allergy & Precautions, H&P , NPO status , Patient's Chart, lab work & pertinent test results  Airway Mallampati: III  TM Distance: >3 FB Neck ROM: Full    Dental no notable dental hx.    Pulmonary neg pulmonary ROS, sleep apnea    Pulmonary exam normal breath sounds clear to auscultation       Cardiovascular hypertension, + Peripheral Vascular Disease  Normal cardiovascular exam Rhythm:Regular Rate:Normal     Neuro/Psych  PSYCHIATRIC DISORDERS Anxiety     negative neurological ROS  negative psych ROS   GI/Hepatic negative GI ROS, Neg liver ROS,,,  Endo/Other  diabetes, Type 2    Renal/GU Renal diseasenegative Renal ROS  negative genitourinary   Musculoskeletal negative musculoskeletal ROS (+)    Abdominal   Peds negative pediatric ROS (+)  Hematology negative hematology ROS (+)   Anesthesia Other Findings Hypertension  Obesity OSA (obstructive sleep apnea) Hyperlipidemia Type 2 diabetes mellitus (HCC)  Diabetic nephropathy  Preop, patient states she becomes "nauseated" and dizzy if she lies flat.  Explained to patient she will need to lie flat for this surgery. Patient states she "feels groggy all day" with "regular anesthesia meds,"  but does "OK" with propofol. Plan for cataract surgery will be sedation with versed and perhaps fentanyl.   Postop, patient stated she felt "weak" and nauseated. BP stable.Heart rate to 118 and then back to baseline. Skin slightly clammy.    Patient received zofran, with some relief, but still feeling "weak" dizzy and nauseated.  EKG postop NSR. Patient does not feel able to get out of OR chair/bed. Will send her to [facility] ER, discussed w/patient and her husband.   Spoke on phone postop with Dr. Vivien Rossetti ER physician [place], blood sugar postop in OR was 100.  Patient feeling "weak"  and "sick" and "nauseated."   Reproductive/Obstetrics negative OB ROS                             Anesthesia Physical Anesthesia Plan  ASA: 3  Anesthesia Plan: MAC   Post-op Pain Management:    Induction: Intravenous  PONV Risk Score and Plan:   Airway Management Planned: Natural Airway and Nasal Cannula  Additional Equipment:   Intra-op Plan:   Post-operative Plan:   Informed Consent: I have reviewed the patients History and Physical, chart, labs and discussed the procedure including the risks, benefits and alternatives for the proposed anesthesia with the patient or authorized representative who has indicated his/her understanding and acceptance.     Dental Advisory Given  Plan Discussed with: Anesthesiologist, CRNA and Surgeon  Anesthesia Plan Comments: (Patient consented for risks of anesthesia including but not limited to:  - adverse reactions to medications - damage to eyes, teeth, lips or other oral mucosa - nerve damage due to positioning  - sore throat or hoarseness - Damage to heart, brain, nerves, lungs, other parts of body or loss of life  Patient voiced understanding.)       Anesthesia Quick Evaluation

## 2022-12-21 NOTE — Progress Notes (Signed)
Pt presented in office to check home BP machine ( wrist cuff) pt cuff reading was 155/100 but was inaccurate  but pt did not want to wait to let me check it again after she sat for about 10 mins. Pt stated she would rather take it at another time

## 2022-12-27 NOTE — Discharge Instructions (Signed)

## 2022-12-29 ENCOUNTER — Other Ambulatory Visit: Payer: Self-pay

## 2022-12-29 ENCOUNTER — Encounter: Payer: Self-pay | Admitting: Ophthalmology

## 2022-12-29 ENCOUNTER — Encounter
Admission: RE | Disposition: A | Discharge status: Home / Self Care | Payer: Self-pay | Source: Home / Self Care | Attending: Ophthalmology

## 2022-12-29 ENCOUNTER — Ambulatory Visit: Payer: Medicare Other | Admitting: Anesthesiology

## 2022-12-29 ENCOUNTER — Ambulatory Visit
Admission: RE | Admit: 2022-12-29 | Discharge: 2022-12-29 | Disposition: A | Discharge status: Home / Self Care | Payer: Medicare Other | Attending: Ophthalmology | Admitting: Ophthalmology

## 2022-12-29 DIAGNOSIS — I10 Essential (primary) hypertension: Secondary | ICD-10-CM | POA: Insufficient documentation

## 2022-12-29 DIAGNOSIS — I129 Hypertensive chronic kidney disease with stage 1 through stage 4 chronic kidney disease, or unspecified chronic kidney disease: Secondary | ICD-10-CM | POA: Diagnosis not present

## 2022-12-29 DIAGNOSIS — Z01810 Encounter for preprocedural cardiovascular examination: Secondary | ICD-10-CM | POA: Diagnosis not present

## 2022-12-29 DIAGNOSIS — E1122 Type 2 diabetes mellitus with diabetic chronic kidney disease: Secondary | ICD-10-CM | POA: Diagnosis not present

## 2022-12-29 DIAGNOSIS — H2511 Age-related nuclear cataract, right eye: Secondary | ICD-10-CM | POA: Diagnosis not present

## 2022-12-29 DIAGNOSIS — E1136 Type 2 diabetes mellitus with diabetic cataract: Secondary | ICD-10-CM | POA: Insufficient documentation

## 2022-12-29 DIAGNOSIS — N182 Chronic kidney disease, stage 2 (mild): Secondary | ICD-10-CM | POA: Diagnosis not present

## 2022-12-29 DIAGNOSIS — Z87891 Personal history of nicotine dependence: Secondary | ICD-10-CM | POA: Diagnosis not present

## 2022-12-29 HISTORY — PX: CATARACT EXTRACTION W/PHACO: SHX586

## 2022-12-29 LAB — GLUCOSE, CAPILLARY
Glucose-Capillary: 80 mg/dL (ref 70–99)
Glucose-Capillary: 100 mg/dL — ABNORMAL HIGH (ref 70–99)
Glucose-Capillary: 130 mg/dL — ABNORMAL HIGH (ref 70–99)

## 2022-12-29 SURGERY — PHACOEMULSIFICATION, CATARACT, WITH IOL INSERTION
Anesthesia: Monitor Anesthesia Care | Site: Eye | Laterality: Right

## 2022-12-29 MED ORDER — FENTANYL CITRATE (PF) 100 MCG/2ML IJ SOLN
INTRAMUSCULAR | Status: DC | PRN
  Administered 2022-12-29: 50 ug via INTRAVENOUS

## 2022-12-29 MED ORDER — SIGHTPATH DOSE#1 BSS IO SOLN
INTRAOCULAR | Status: DC | PRN
  Administered 2022-12-29: 15 mL via INTRAOCULAR

## 2022-12-29 MED ORDER — SIGHTPATH DOSE#1 NA HYALUR & NA CHOND-NA HYALUR IO KIT
PACK | INTRAOCULAR | Status: DC | PRN
  Administered 2022-12-29: 1

## 2022-12-29 MED ORDER — CEFUROXIME OPHTHALMIC INJECTION 1 MG/0.1 ML
INJECTION | OPHTHALMIC | Status: DC | PRN
  Administered 2022-12-29: .1 mL via SUBCONJUNCTIVAL

## 2022-12-29 MED ORDER — LACTATED RINGERS IV SOLN: INTRAVENOUS | Status: DC

## 2022-12-29 MED ORDER — TETRACAINE HCL 0.5 % OP SOLN
1.0000 [drp] | OPHTHALMIC | Status: DC | PRN
  Administered 2022-12-29 (×3): 1 [drp] via OPHTHALMIC

## 2022-12-29 MED ORDER — BRIMONIDINE TARTRATE-TIMOLOL 0.2-0.5 % OP SOLN
OPHTHALMIC | Status: DC | PRN
  Administered 2022-12-29: 1 [drp] via OPHTHALMIC

## 2022-12-29 MED ORDER — MIDAZOLAM HCL 2 MG/2ML IJ SOLN
INTRAMUSCULAR | Status: DC | PRN
  Administered 2022-12-29: 2 mg via INTRAVENOUS

## 2022-12-29 MED ORDER — ARMC OPHTHALMIC DILATING DROPS
1.0000 | OPHTHALMIC | Status: DC | PRN
  Administered 2022-12-29 (×3): 1 via OPHTHALMIC

## 2022-12-29 MED ORDER — DEXTROSE 50 % IV SOLN
15.0000 mL | Freq: Once | INTRAVENOUS | Status: AC
  Administered 2022-12-29: 15 mL via INTRAVENOUS

## 2022-12-29 MED ORDER — SODIUM CHLORIDE 0.9% FLUSH
INTRAVENOUS | Status: DC | PRN
  Administered 2022-12-29: 10 mL via INTRAVENOUS

## 2022-12-29 MED ORDER — ONDANSETRON HCL 4 MG/2ML IJ SOLN
INTRAMUSCULAR | Status: DC | PRN
  Administered 2022-12-29: 4 mg via INTRAVENOUS

## 2022-12-29 MED ORDER — SIGHTPATH DOSE#1 BSS IO SOLN
INTRAOCULAR | Status: DC | PRN
  Administered 2022-12-29: 2 mL

## 2022-12-29 MED ORDER — SIGHTPATH DOSE#1 BSS IO SOLN
INTRAOCULAR | Status: DC | PRN
  Administered 2022-12-29: 88 mL via OPHTHALMIC

## 2022-12-29 SURGICAL SUPPLY — 11 items
CANNULA ANT/CHMB 27G (MISCELLANEOUS) IMPLANT
CANNULA ANT/CHMB 27GA (MISCELLANEOUS) IMPLANT
CATARACT SUITE SIGHTPATH (MISCELLANEOUS) ×1 IMPLANT
FEE CATARACT SUITE SIGHTPATH (MISCELLANEOUS) ×1 IMPLANT
GLOVE SRG 8 PF TXTR STRL LF DI (GLOVE) ×1 IMPLANT
GLOVE SURG ENC TEXT LTX SZ7.5 (GLOVE) ×1 IMPLANT
GLOVE SURG UNDER POLY LF SZ8 (GLOVE) ×1
LENS IOL TECNIS EYHANCE 21.0 (Intraocular Lens) IMPLANT
NDL FILTER BLUNT 18X1 1/2 (NEEDLE) ×1 IMPLANT
NEEDLE FILTER BLUNT 18X1 1/2 (NEEDLE) ×1 IMPLANT
SYR 3ML LL SCALE MARK (SYRINGE) ×1 IMPLANT

## 2022-12-29 NOTE — Progress Notes (Signed)
EMS arrived to transport pt to ER.  Evaluated by 'EMS personnel.  Pt refused to go stating she just need to eat some crackers and did not want to wait in ER for hours.  Husband in agreement.  VS stable and WNL.  EMS assisted pt to standing position to check BP which was WNL.  Pt continues to states she just feels bad and wants to go home.  Pt and husband signed AMA papers and pt discharged via w/c to car.

## 2022-12-29 NOTE — Op Note (Signed)
LOCATION:  [facility]   PREOPERATIVE DIAGNOSIS:    Nuclear sclerotic cataract right eye. H25.11   POSTOPERATIVE DIAGNOSIS:  Nuclear sclerotic cataract right eye.     PROCEDURE:  Phacoemusification with posterior chamber intraocular lens placement of the right eye   ULTRASOUND TIME: Procedure(s) with comments: CATARACT EXTRACTION PHACO AND INTRAOCULAR LENS PLACEMENT (IOC) RIGHT 4.98 00:42:4 (Right) - Diabetic  LENS:   Implant Name Type Inv. Item Serial No. Manufacturer Lot No. LRB No. Used Action  LENS IOL TECNIS EYHANCE 21.0 - [id number] Intraocular Lens LENS IOL TECNIS EYHANCE 21.0 [id number] [employer]  Right 1 Implanted         SURGEON:  Deirdre Evener, MD   ANESTHESIA:  Topical with tetracaine drops and 2% Xylocaine jelly, augmented with 1% preservative-free intracameral lidocaine.    COMPLICATIONS:  None.   DESCRIPTION OF PROCEDURE:  The patient was identified in the holding room and transported to the operating room and placed in the supine position under the operating microscope.  The right eye was identified as the operative eye and it was prepped and draped in the usual sterile ophthalmic fashion.   A 1 millimeter clear-corneal paracentesis was made at the 12:00 position.  0.5 ml of preservative-free 1% lidocaine was injected into the anterior chamber. The anterior chamber was filled with Viscoat viscoelastic.  A 2.4 millimeter keratome was used to make a near-clear corneal incision at the 9:00 position.  A curvilinear capsulorrhexis was made with a cystotome and capsulorrhexis forceps.  Balanced salt solution was used to hydrodissect and hydrodelineate the nucleus.   Phacoemulsification was then used in stop and chop fashion to remove the lens nucleus and epinucleus.  The remaining cortex was then removed using the irrigation and aspiration handpiece. Provisc was then placed into the capsular bag to distend it for lens placement.  A lens was then injected  into the capsular bag.  The remaining viscoelastic was aspirated.   Wounds were hydrated with balanced salt solution.  The anterior chamber was inflated to a physiologic pressure with balanced salt solution.  No wound leaks were noted. Cefuroxime 0.1 ml of a 10mg /ml solution was injected into the anterior chamber for a dose of 1 mg of intracameral antibiotic at the completion of the case.   Timolol and Brimonidine drops were applied to the eye.  The patient was taken to the recovery room in stable condition without complications of anesthesia or surgery.   Tanuj Mullens 12/29/2022, 11:47 AM

## 2022-12-29 NOTE — H&P (Signed)
[facility]   Primary Care Physician:  Sherlene Shams, MD Ophthalmologist: Dr. Lockie Mola  Pre-Procedure History & Physical: HPI:  Penny Butler is a 72 y.o. female here for ophthalmic surgery.   Past Medical History:  Diagnosis Date   Hyperlipidemia    Hypertension    Obesity    OSA (obstructive sleep apnea)    now off of CAPAP without symptoms    Type 2 diabetes mellitus (HCC)     Past Surgical History:  Procedure Laterality Date   ABDOMINAL HYSTERECTOMY     BILATERAL OOPHORECTOMY  06/21/2005   [place]   COLONOSCOPY      Prior to Admission medications   Medication Sig Start Date End Date Taking? Authorizing Provider  Cyanocobalamin (VITAMIN B-12 SL) Place under the tongue daily.   Yes [provider]  Multiple Vitamin (MULTIVITAMIN) tablet Take 1 tablet by mouth daily.   Yes [provider]  progesterone (PROMETRIUM) 200 MG capsule Take 200 mg by mouth at bedtime. 07/06/22  Yes [provider]  rosuvastatin (CRESTOR) 5 MG tablet Take 1 tablet (5 mg total) by mouth daily. 11/08/22  Yes Sherlene Shams, MD  telmisartan (MICARDIS) 40 MG tablet TAKE 1 TABLET BY MOUTH EVERYDAY AT BEDTIME 10/22/22  Yes Sherlene Shams, MD  tirzepatide [facility]) 12.5 MG/0.5ML Pen Inject 12.5 mg into the skin once a week. 12/07/22 03/07/23 Yes Sherlene Shams, MD  VITAMIN D PO Take by mouth daily.   Yes [provider]  OneTouch Delica Lancets 30G MISC USE AS DIRECTED EVERY DAY 02/27/19   Sherlene Shams, MD  [facility] VERIO test strip USE AS DIRECTED ONCE PER DAY 06/29/21   Sherlene Shams, MD    Allergies as of 12/16/2022 - Review Complete 11/08/2022  Allergen Reaction Noted   Citalopram Other (See Comments) 10/21/2015   Codeine Nausea Only 11/22/2013   Tacrine Other (See Comments) 08/18/2021    Family History  Problem Relation Age of Onset   Breast cancer Sister 20   Heart disease Mother    Lung cancer Mother    Heart disease Father  52   Lung cancer Father     Social History   Socioeconomic History   Marital status: Married    Spouse name: Not on file   Number of children: Not on file   Years of education: Not on file   Highest education level: Not on file  Occupational History   Not on file  Tobacco Use   Smoking status: Never   Smokeless tobacco: Never  Vaping Use   Vaping Use: Former  Substance and Sexual Activity   Alcohol use: Not Currently    Comment: Few drinks a year   Drug use: No   Sexual activity: Yes  Other Topics Concern   Not on file  Social History Narrative   Not on file   Social Determinants of Health   Financial Resource Strain: Low Risk  (11/26/2021)   Overall Financial Resource Strain (CARDIA)    Difficulty of Paying Living Expenses: Not hard at all  Food Insecurity: No Food Insecurity (11/26/2021)   Hunger Vital Sign    Worried About Running Out of Food in the Last Year: Never true    Ran Out of Food in the Last Year: Never true  Transportation Needs: No Transportation Needs (11/26/2021)   PRAPARE - Administrator, Civil Service (Medical): No    Lack of Transportation (Non-Medical): No  Physical Activity: Sufficiently  Active (11/26/2021)   Exercise Vital Sign    Days of Exercise per Week: 7 days    Minutes of Exercise per Session: 30 min  Stress: No Stress Concern Present (11/26/2021)   [employer] of Occupational Health - Occupational Stress Questionnaire    Feeling of Stress : Not at all  Social Connections: Unknown (11/26/2021)   Social Connection and Isolation Panel [NHANES]    Frequency of Communication with Friends and Family: Not on file    Frequency of Social Gatherings with Friends and Family: Not on file    Attends Religious Services: Not on file    Active Member of Clubs or Organizations: Not on file    Attends Banker Meetings: Not on file    Marital Status: Married  Intimate Partner Violence: Not At Risk (11/26/2021)   Humiliation,  Afraid, Rape, and Kick questionnaire    Fear of Current or Ex-Partner: No    Emotionally Abused: No    Physically Abused: No    Sexually Abused: No    Review of Systems: See HPI, otherwise negative ROS  Physical Exam: BP (!) 170/90   Pulse 96   Temp 98.4 F (36.9 C) (Temporal)   Resp 18   Ht 5' (1.524 m)   Wt 59.9 kg   SpO2 99%   BMI 25.78 kg/m  General:   Alert,  pleasant and cooperative in NAD Head:  Normocephalic and atraumatic. Lungs:  Clear to auscultation.    Heart:  Regular rate and rhythm.   Impression/Plan: Penny Butler is here for ophthalmic surgery.  Risks, benefits, limitations, and alternatives regarding ophthalmic surgery have been reviewed with the patient.  Questions have been answered.  All parties agreeable.   Lockie Mola, MD  12/29/2022, 10:08 AM

## 2022-12-29 NOTE — Anesthesia Postprocedure Evaluation (Signed)
Anesthesia Post Note  Patient: ANGELINE TRICK  Procedure(s) Performed: CATARACT EXTRACTION PHACO AND INTRAOCULAR LENS PLACEMENT (IOC) RIGHT 4.98 00:42:4 (Right: Eye)  Patient location during evaluation: PACU Anesthesia Type: MAC Level of consciousness: awake and alert Pain management: pain level controlled Vital Signs Assessment: post-procedure vital signs reviewed and stable Respiratory status: spontaneous breathing, nonlabored ventilation, respiratory function stable and patient connected to nasal cannula oxygen Cardiovascular status: stable and blood pressure returned to baseline Postop Assessment: no apparent nausea or vomiting Anesthetic complications: no   No notable events documented.   Last Vitals:  Vitals:   12/29/22 1234 12/29/22 1245  BP:  (!) 146/64  Pulse: 78 79  Resp: (!) 23 19  Temp:    SpO2: 100% 100%    Last Pain:  Vitals:   12/29/22 1200  TempSrc:   PainSc: 0-No pain                 Daianna Vasques C Chinedu Agustin

## 2022-12-29 NOTE — Progress Notes (Signed)
Please see note from Preop/postop evaluation. Patient had originally wanted to go to hospital, but once EMS arrived, patient decided she didn't want to go to hospital; she wanted to eat crackers and go home and rest.  Husband had originally agreed with EMS transport to hospital, but supported his wife in her desire to go home instead. Blood sugar was rechecked and was 130, vital signs stable, has consistently denied chest pain, chest pressure, tightness in chest, dyspnea, but has reported nausea and "feeling weak." Patient signed AMA form for both ASC and EMS, and was transported via wheelchair to car, where her husband then took her in the car, and presumably, they went home.  Patient states she can "never have this kind of surgery again."

## 2022-12-29 NOTE — Transfer of Care (Addendum)
Immediate Anesthesia Transfer of Care Note  Patient: Penny Butler  Procedure(s) Performed: CATARACT EXTRACTION PHACO AND INTRAOCULAR LENS PLACEMENT (IOC) RIGHT 4.98 00:42:4 (Right: Eye)  Patient Location: PACU  Anesthesia Type:MAC  Level of Consciousness: awake, alert , and oriented  Airway & Oxygen Therapy: Patient Spontanous Breathing and Patient connected to nasal cannula oxygen  Post-op Assessment: Report given to RN and Post -op Vital signs reviewed and stable  Post vital signs: Reviewed  Last Vitals: Dr. Juel Burrow with pt and EKG called for.   Vitals Value Taken Time  BP 161/89 12/29/22 1200  Temp 35.7 C 12/29/22 1200  Pulse 82 12/29/22 1204  Resp 5 12/29/22 1204  SpO2 99 % 12/29/22 1204  Vitals shown include unvalidated device data.  Last Pain:  Vitals:   12/29/22 1007  TempSrc: Temporal  PainSc: 0-No pain         Complications: No notable events documented.

## 2022-12-30 ENCOUNTER — Encounter: Payer: Self-pay | Admitting: Ophthalmology

## 2023-01-03 DIAGNOSIS — H2512 Age-related nuclear cataract, left eye: Secondary | ICD-10-CM | POA: Diagnosis not present

## 2023-01-03 NOTE — Anesthesia Preprocedure Evaluation (Signed)
Anesthesia Evaluation  Patient identified by MRN, date of birth, ID band Patient awake  General Assessment Comment:Preop, patient states she becomes "nauseated" and dizzy if she lies flat.  Explained to patient she will need to lie flat for this surgery. Patient states she "feels groggy all day" with "regular anesthesia meds,"  but does "OK" with propofol. Plan for cataract surgery will be sedation with versed and perhaps fentanyl.    Postop, patient stated she felt "weak" and nauseated. BP stable.Heart rate to 118 and then back to baseline. Skin slightly clammy.     Patient received zofran, with some relief, but still feeling "weak" dizzy and nauseated.  EKG postop NSR. Patient does not feel able to get out of OR chair/bed. Will send her to [facility] ER, discussed w/patient and her husband.    Spoke on phone postop with Dr. Vivien Rossetti ER physician Loraine, blood sugar postop in OR was 100.  Patient feeling "weak" and "sick" and "nauseated."   Reviewed: Allergy & Precautions, H&P , NPO status , Patient's Chart, lab work & pertinent test results  Airway Mallampati: III  TM Distance: >3 FB Neck ROM: Full    Dental no notable dental hx.    Pulmonary neg pulmonary ROS, sleep apnea    Pulmonary exam normal breath sounds clear to auscultation       Cardiovascular hypertension, + Peripheral Vascular Disease  Normal cardiovascular exam Rhythm:Regular Rate:Normal     Neuro/Psych  PSYCHIATRIC DISORDERS Anxiety     negative neurological ROS  negative psych ROS   GI/Hepatic negative GI ROS, Neg liver ROS,,,  Endo/Other  diabetes    Renal/GU Renal diseasenegative Renal ROS  negative genitourinary   Musculoskeletal negative musculoskeletal ROS (+)    Abdominal   Peds negative pediatric ROS (+)  Hematology negative hematology ROS (+)   Anesthesia Other Findings Hypertension  Obesity OSA (obstructive sleep apnea) Hyperlipidemia Type 2  diabetes mellitus (HCC)   Previous cataract surgery 12-29-22 signed out AMA, after at first agreeiing to go via EMS to ER due to feeling 'weak and dizzy" and said she would "not have this anesthesia again." Once EMS arrived, patient still did not feel like moving, but she also didn't want to go to ER. Vital signs were stable.    Nice discussion today with patient. She describes more of a BPV type response to lying flat.  Patient and I also discussed w/Dr. Inez Pilgrim. Will avoid fentanyl. Will attempt to avoid any versed, but if needed, will provide minimal versed, so patient will likely be awake for the case. She is nervous, and we will attempt to verbally alleviate anxiety and with versed if absolutely necessary, as we do wish to honor her desire to avoid "feeling bad" for the entire day like the last time.   Reproductive/Obstetrics negative OB ROS                             Anesthesia Physical Anesthesia Plan  ASA: 3  Anesthesia Plan: MAC   Post-op Pain Management:    Induction: Intravenous  PONV Risk Score and Plan:   Airway Management Planned: Natural Airway and Nasal Cannula  Additional Equipment:   Intra-op Plan:   Post-operative Plan:   Informed Consent: I have reviewed the patients History and Physical, chart, labs and discussed the procedure including the risks, benefits and alternatives for the proposed anesthesia with the patient or authorized representative who has indicated his/her understanding and acceptance.  Dental Advisory Given  Plan Discussed with: Anesthesiologist, CRNA and Surgeon  Anesthesia Plan Comments: (Patient consented for risks of anesthesia including but not limited to:  - adverse reactions to medications - damage to eyes, teeth, lips or other oral mucosa - nerve damage due to positioning  - sore throat or hoarseness - Damage to heart, brain, nerves, lungs, other parts of body or loss of life  Patient voiced  understanding.)       Anesthesia Quick Evaluation

## 2023-01-03 NOTE — Discharge Instructions (Signed)

## 2023-01-05 ENCOUNTER — Encounter: Payer: Self-pay | Admitting: Ophthalmology

## 2023-01-05 ENCOUNTER — Ambulatory Visit: Payer: Self-pay | Admitting: Anesthesiology

## 2023-01-05 ENCOUNTER — Ambulatory Visit
Admission: RE | Admit: 2023-01-05 | Discharge: 2023-01-05 | Disposition: A | Discharge status: Home / Self Care | Payer: Medicare Other | Attending: Ophthalmology | Admitting: Ophthalmology

## 2023-01-05 ENCOUNTER — Other Ambulatory Visit: Payer: Self-pay

## 2023-01-05 ENCOUNTER — Ambulatory Visit: Payer: Medicare Other | Admitting: Anesthesiology

## 2023-01-05 ENCOUNTER — Encounter
Admission: RE | Disposition: A | Discharge status: Home / Self Care | Payer: Self-pay | Source: Home / Self Care | Attending: Ophthalmology

## 2023-01-05 DIAGNOSIS — E1121 Type 2 diabetes mellitus with diabetic nephropathy: Secondary | ICD-10-CM | POA: Diagnosis not present

## 2023-01-05 DIAGNOSIS — G4733 Obstructive sleep apnea (adult) (pediatric): Secondary | ICD-10-CM | POA: Insufficient documentation

## 2023-01-05 DIAGNOSIS — E1151 Type 2 diabetes mellitus with diabetic peripheral angiopathy without gangrene: Secondary | ICD-10-CM | POA: Diagnosis not present

## 2023-01-05 DIAGNOSIS — I10 Essential (primary) hypertension: Secondary | ICD-10-CM | POA: Diagnosis not present

## 2023-01-05 DIAGNOSIS — Z7984 Long term (current) use of oral hypoglycemic drugs: Secondary | ICD-10-CM | POA: Diagnosis not present

## 2023-01-05 DIAGNOSIS — N182 Chronic kidney disease, stage 2 (mild): Secondary | ICD-10-CM | POA: Diagnosis not present

## 2023-01-05 DIAGNOSIS — H2512 Age-related nuclear cataract, left eye: Secondary | ICD-10-CM | POA: Diagnosis not present

## 2023-01-05 DIAGNOSIS — E1122 Type 2 diabetes mellitus with diabetic chronic kidney disease: Secondary | ICD-10-CM | POA: Diagnosis not present

## 2023-01-05 DIAGNOSIS — E1136 Type 2 diabetes mellitus with diabetic cataract: Secondary | ICD-10-CM | POA: Insufficient documentation

## 2023-01-05 DIAGNOSIS — I129 Hypertensive chronic kidney disease with stage 1 through stage 4 chronic kidney disease, or unspecified chronic kidney disease: Secondary | ICD-10-CM | POA: Diagnosis not present

## 2023-01-05 HISTORY — PX: CATARACT EXTRACTION W/PHACO: SHX586

## 2023-01-05 LAB — GLUCOSE, CAPILLARY: Glucose-Capillary: 90 mg/dL (ref 70–99)

## 2023-01-05 SURGERY — PHACOEMULSIFICATION, CATARACT, WITH IOL INSERTION
Anesthesia: Monitor Anesthesia Care | Laterality: Left

## 2023-01-05 MED ORDER — LACTATED RINGERS IV SOLN: INTRAVENOUS | Status: DC

## 2023-01-05 MED ORDER — CEFUROXIME OPHTHALMIC INJECTION 1 MG/0.1 ML
INJECTION | OPHTHALMIC | Status: DC | PRN
  Administered 2023-01-05: 1 mg via INTRACAMERAL

## 2023-01-05 MED ORDER — SIGHTPATH DOSE#1 BSS IO SOLN
INTRAOCULAR | Status: DC | PRN
  Administered 2023-01-05: 15 mL via INTRAOCULAR

## 2023-01-05 MED ORDER — BRIMONIDINE TARTRATE-TIMOLOL 0.2-0.5 % OP SOLN
OPHTHALMIC | Status: DC | PRN
  Administered 2023-01-05: 1 [drp] via OPHTHALMIC

## 2023-01-05 MED ORDER — ARMC OPHTHALMIC DILATING DROPS
1.0000 | OPHTHALMIC | Status: DC | PRN
  Administered 2023-01-05 (×3): 1 via OPHTHALMIC

## 2023-01-05 MED ORDER — TETRACAINE HCL 0.5 % OP SOLN
1.0000 [drp] | OPHTHALMIC | Status: DC | PRN
  Administered 2023-01-05 (×3): 1 [drp] via OPHTHALMIC

## 2023-01-05 MED ORDER — SIGHTPATH DOSE#1 BSS IO SOLN
INTRAOCULAR | Status: DC | PRN
  Administered 2023-01-05: 2 mL

## 2023-01-05 MED ORDER — SIGHTPATH DOSE#1 NA HYALUR & NA CHOND-NA HYALUR IO KIT
PACK | INTRAOCULAR | Status: DC | PRN
  Administered 2023-01-05: 1 via OPHTHALMIC

## 2023-01-05 MED ORDER — ONDANSETRON HCL 4 MG/2ML IJ SOLN
4.0000 mg | Freq: Once | INTRAMUSCULAR | Status: AC
  Administered 2023-01-05: 4 mg via INTRAVENOUS

## 2023-01-05 MED ORDER — SIGHTPATH DOSE#1 BSS IO SOLN
INTRAOCULAR | Status: DC | PRN
  Administered 2023-01-05: 80 mL via OPHTHALMIC

## 2023-01-05 MED ORDER — TETRACAINE 0.5 % OP SOLN OPTIME - NO CHARGE
OPHTHALMIC | Status: DC | PRN
  Administered 2023-01-05: 1 [drp] via OPHTHALMIC

## 2023-01-05 SURGICAL SUPPLY — 9 items
CATARACT SUITE SIGHTPATH (MISCELLANEOUS) ×1
FEE CATARACT SUITE SIGHTPATH (MISCELLANEOUS) ×1 IMPLANT
GLOVE SRG 8 PF TXTR STRL LF DI (GLOVE) ×1 IMPLANT
GLOVE SURG ENC TEXT LTX SZ7.5 (GLOVE) ×1 IMPLANT
GLOVE SURG UNDER POLY LF SZ8 (GLOVE) ×1
LENS IOL TECNIS EYHANCE 20.5 (Intraocular Lens) IMPLANT
NDL FILTER BLUNT 18X1 1/2 (NEEDLE) ×1 IMPLANT
NEEDLE FILTER BLUNT 18X1 1/2 (NEEDLE) ×1
SYR 3ML LL SCALE MARK (SYRINGE) ×1 IMPLANT

## 2023-01-05 NOTE — Op Note (Signed)
OPERATIVE NOTE  Penny Butler [id number] 01/05/2023   PREOPERATIVE DIAGNOSIS:  Nuclear sclerotic cataract left eye. H25.12   POSTOPERATIVE DIAGNOSIS:    Nuclear sclerotic cataract left eye.     PROCEDURE:  Phacoemusification with posterior chamber intraocular lens placement of the left eye  Ultrasound time: Procedure(s) with comments: CATARACT EXTRACTION PHACO AND INTRAOCULAR LENS PLACEMENT (IOC) LEFT   DIABETIC 4.05 00:35.0 (Left) - Diabetic  LENS:   Implant Name Type Inv. Item Serial No. Manufacturer Lot No. LRB No. Used Action  LENS IOL TECNIS EYHANCE 20.5 - [id number] Intraocular Lens LENS IOL TECNIS EYHANCE 20.5 [id number] [employer]  Left 1 Implanted      SURGEON:  Deirdre Evener, MD   ANESTHESIA:  Topical with tetracaine drops and 2% Xylocaine jelly, augmented with 1% preservative-free intracameral lidocaine.    COMPLICATIONS:  None.   DESCRIPTION OF PROCEDURE:  The patient was identified in the holding room and transported to the operating room and placed in the supine position under the operating microscope.  The left eye was identified as the operative eye and it was prepped and draped in the usual sterile ophthalmic fashion.   A 1 millimeter clear-corneal paracentesis was made at the 1:30 position.  0.5 ml of preservative-free 1% lidocaine was injected into the anterior chamber.  The anterior chamber was filled with Viscoat viscoelastic.  A 2.4 millimeter keratome was used to make a near-clear corneal incision at the 10:30 position.  .  A curvilinear capsulorrhexis was made with a cystotome and capsulorrhexis forceps.  Balanced salt solution was used to hydrodissect and hydrodelineate the nucleus.   Phacoemulsification was then used in stop and chop fashion to remove the lens nucleus and epinucleus.  The remaining cortex was then removed using the irrigation and aspiration handpiece. Provisc was then placed into the capsular bag to distend it for lens placement.  A  lens was then injected into the capsular bag.  The remaining viscoelastic was aspirated.   Wounds were hydrated with balanced salt solution.  The anterior chamber was inflated to a physiologic pressure with balanced salt solution.  No wound leaks were noted. Cefuroxime 0.1 ml of a 10mg /ml solution was injected into the anterior chamber for a dose of 1 mg of intracameral antibiotic at the completion of the case.   Timolol and Brimonidine drops were applied to the eye.  The patient was taken to the recovery room in stable condition without complications of anesthesia or surgery.  [name] 01/05/2023, 8:30 AM

## 2023-01-05 NOTE — Anesthesia Postprocedure Evaluation (Signed)
Anesthesia Post Note  Patient: Penny Butler  Procedure(s) Performed: CATARACT EXTRACTION PHACO AND INTRAOCULAR LENS PLACEMENT (IOC) LEFT   DIABETIC 4.05 00:35.0 (Left)  Patient location during evaluation: PACU Anesthesia Type: MAC Level of consciousness: awake and alert Pain management: pain level controlled Vital Signs Assessment: post-procedure vital signs reviewed and stable Respiratory status: spontaneous breathing, nonlabored ventilation, respiratory function stable and patient connected to nasal cannula oxygen Cardiovascular status: stable and blood pressure returned to baseline Postop Assessment: no apparent nausea or vomiting Anesthetic complications: no Comments: [name] was happy with her care, does not feel nauseated, dizzy nor weak.  Very lovely lady, smiling, and feels she can go home and is glad she had a good experience this time.    No notable events documented.   Last Vitals:  Vitals:   01/05/23 0835 01/05/23 0837  BP:  (!) 170/85  Pulse: 79 79  Resp: 16 16  Temp:    SpO2: 99% 99%    Last Pain:  Vitals:   01/05/23 0837  TempSrc:   PainSc: 0-No pain                 Marisue Humble

## 2023-01-05 NOTE — Transfer of Care (Signed)
Immediate Anesthesia Transfer of Care Note  Patient: Penny Butler  Procedure(s) Performed: CATARACT EXTRACTION PHACO AND INTRAOCULAR LENS PLACEMENT (IOC) LEFT   DIABETIC 4.05 00:35.0 (Left)  Patient Location: PACU  Anesthesia Type: MAC  Level of Consciousness: awake, alert  and patient cooperative  Airway and Oxygen Therapy: Patient Spontanous Breathing and Patient connected to supplemental oxygen  Post-op Assessment: Post-op Vital signs reviewed, Patient's Cardiovascular Status Stable, Respiratory Function Stable, Patent Airway and No signs of Nausea or vomiting  Post-op Vital Signs: Reviewed and stable  Complications: No notable events documented.

## 2023-01-05 NOTE — H&P (Signed)
[facility]   Primary Care Physician:  Sherlene Shams, MD Ophthalmologist: Dr. Lockie Mola  Pre-Procedure History & Physical: HPI:  Penny Butler is a 72 y.o. female here for ophthalmic surgery.   Past Medical History:  Diagnosis Date   Hyperlipidemia    Hypertension    Obesity    OSA (obstructive sleep apnea)    now off of CAPAP without symptoms    Type 2 diabetes mellitus (HCC)     Past Surgical History:  Procedure Laterality Date   ABDOMINAL HYSTERECTOMY     BILATERAL OOPHORECTOMY  06/21/2005   [place]   CATARACT EXTRACTION W/PHACO Right 12/29/2022   Procedure: CATARACT EXTRACTION PHACO AND INTRAOCULAR LENS PLACEMENT (IOC) RIGHT 4.98 00:42:4;  Surgeon: Lockie Mola, MD;  Location: [facility];  Service: Ophthalmology;  Laterality: Right;  Diabetic   COLONOSCOPY      Prior to Admission medications   Medication Sig Start Date End Date Taking? Authorizing Provider  Cyanocobalamin (VITAMIN B-12 SL) Place under the tongue daily.   Yes [provider]  Multiple Vitamin (MULTIVITAMIN) tablet Take 1 tablet by mouth daily.   Yes [provider]  OneTouch Delica Lancets 30G MISC USE AS DIRECTED EVERY DAY 02/27/19  Yes Sherlene Shams, MD  [facility] VERIO test strip USE AS DIRECTED ONCE PER DAY 06/29/21  Yes Sherlene Shams, MD  progesterone (PROMETRIUM) 200 MG capsule Take 200 mg by mouth at bedtime. 07/06/22  Yes [provider]  rosuvastatin (CRESTOR) 5 MG tablet Take 1 tablet (5 mg total) by mouth daily. 11/08/22  Yes Sherlene Shams, MD  telmisartan (MICARDIS) 40 MG tablet TAKE 1 TABLET BY MOUTH EVERYDAY AT BEDTIME 10/22/22  Yes Sherlene Shams, MD  tirzepatide [facility]) 12.5 MG/0.5ML Pen Inject 12.5 mg into the skin once a week. 12/07/22 03/07/23 Yes Sherlene Shams, MD  VITAMIN D PO Take by mouth daily.   Yes [provider]    Allergies as of 12/16/2022 - Review Complete 11/08/2022  Allergen Reaction Noted    Citalopram Other (See Comments) 10/21/2015   Codeine Nausea Only 11/22/2013   Tacrine Other (See Comments) 08/18/2021    Family History  Problem Relation Age of Onset   Breast cancer Sister 50   Heart disease Mother    Lung cancer Mother    Heart disease Father 17   Lung cancer Father     Social History   Socioeconomic History   Marital status: Married    Spouse name: Not on file   Number of children: Not on file   Years of education: Not on file   Highest education level: Not on file  Occupational History   Not on file  Tobacco Use   Smoking status: Never   Smokeless tobacco: Never  Vaping Use   Vaping status: Former  Substance and Sexual Activity   Alcohol use: Not Currently    Comment: Few drinks a year   Drug use: No   Sexual activity: Yes  Other Topics Concern   Not on file  Social History Narrative   Not on file   Social Determinants of Health   Financial Resource Strain: Low Risk  (11/26/2021)   Overall Financial Resource Strain (CARDIA)    Difficulty of Paying Living Expenses: Not hard at all  Food Insecurity: No Food Insecurity (11/26/2021)   Hunger Vital Sign    Worried About Running Out of Food in the Last Year: Never true    Ran Out of  Food in the Last Year: Never true  Transportation Needs: No Transportation Needs (11/26/2021)   PRAPARE - Administrator, Civil Service (Medical): No    Lack of Transportation (Non-Medical): No  Physical Activity: Sufficiently Active (11/26/2021)   Exercise Vital Sign    Days of Exercise per Week: 7 days    Minutes of Exercise per Session: 30 min  Stress: No Stress Concern Present (11/26/2021)   [employer] of Occupational Health - Occupational Stress Questionnaire    Feeling of Stress : Not at all  Social Connections: Unknown (11/26/2021)   Social Connection and Isolation Panel [NHANES]    Frequency of Communication with Friends and Family: Not on file    Frequency of Social Gatherings with Friends and  Family: Not on file    Attends Religious Services: Not on file    Active Member of Clubs or Organizations: Not on file    Attends Banker Meetings: Not on file    Marital Status: Married  Intimate Partner Violence: Not At Risk (11/26/2021)   Humiliation, Afraid, Rape, and Kick questionnaire    Fear of Current or Ex-Partner: No    Emotionally Abused: No    Physically Abused: No    Sexually Abused: No    Review of Systems: See HPI, otherwise negative ROS  Physical Exam: BP (!) 181/88   Pulse 82   Temp 97.7 F (36.5 C) (Temporal)   Ht 5' (1.524 m)   Wt 57.8 kg   SpO2 100%   BMI 24.90 kg/m  General:   Alert,  pleasant and cooperative in NAD Head:  Normocephalic and atraumatic. Lungs:  Clear to auscultation.    Heart:  Regular rate and rhythm.   Impression/Plan: Penny Butler is here for ophthalmic surgery.  Risks, benefits, limitations, and alternatives regarding ophthalmic surgery have been reviewed with the patient.  Questions have been answered.  All parties agreeable.   Lockie Mola, MD  01/05/2023, 7:33 AM

## 2023-01-06 ENCOUNTER — Encounter: Payer: Self-pay | Admitting: Ophthalmology

## 2023-01-10 ENCOUNTER — Other Ambulatory Visit: Payer: Self-pay

## 2023-01-12 ENCOUNTER — Other Ambulatory Visit: Payer: Self-pay

## 2023-01-12 ENCOUNTER — Telehealth: Payer: Self-pay | Admitting: Internal Medicine

## 2023-01-12 DIAGNOSIS — E1121 Type 2 diabetes mellitus with diabetic nephropathy: Secondary | ICD-10-CM

## 2023-01-12 MED ORDER — MOUNJARO 12.5 MG/0.5ML ~~LOC~~ SOAJ
12.5000 mg | SUBCUTANEOUS | 0 refills | Status: DC
  Filled 2023-01-12: qty 2, 28d supply, fill #0

## 2023-01-12 NOTE — Telephone Encounter (Signed)
Medication refill has been sent to [facility].

## 2023-01-12 NOTE — Telephone Encounter (Signed)
Prescription Request  01/12/2023  LOV: 11/08/2022  What is the name of the medication or equipment? Moujaro 12.5 PATIENT STATED DON'T WITHDRAWAL PRESCRIPTION FROM [facility], ON BACK ORDER.  Have you contacted your pharmacy to request a refill? Yes   Which pharmacy would you like this sent to?   [facility] [address] [place] [address] Phone: [phone] Fax: [phone]    Patient notified that their request is being sent to the clinical staff for review and that they should receive a response within 2 business days.   Please advise at [facility] [phone]

## 2023-01-12 NOTE — Addendum Note (Signed)
Addended by: Sandy Salaam on: 01/12/2023 04:00 PM   Modules accepted: Orders

## 2023-01-18 DIAGNOSIS — R5382 Chronic fatigue, unspecified: Secondary | ICD-10-CM | POA: Diagnosis not present

## 2023-01-18 DIAGNOSIS — R232 Flushing: Secondary | ICD-10-CM | POA: Diagnosis not present

## 2023-01-24 ENCOUNTER — Ambulatory Visit
Admission: RE | Admit: 2023-01-24 | Discharge: 2023-01-24 | Disposition: A | Discharge status: Home / Self Care | Payer: Medicare Other | Source: Ambulatory Visit | Attending: Internal Medicine | Admitting: Internal Medicine

## 2023-01-24 DIAGNOSIS — Z1231 Encounter for screening mammogram for malignant neoplasm of breast: Secondary | ICD-10-CM | POA: Insufficient documentation

## 2023-02-01 MED ORDER — TELMISARTAN 40 MG PO TABS: ORAL_TABLET | ORAL | 1 refills | Status: DC

## 2023-02-01 NOTE — Telephone Encounter (Signed)
Prescription Request  02/01/2023  LOV: 06/02/2022  What is the name of the medication or equipment? telmisartan (MICARDIS) 40 MG tablet  Have you contacted your pharmacy to request a refill? Yes   Which pharmacy would you like this sent to?   [facility] [place] [address]    Patient notified that their request is being sent to the clinical staff for review and that they should receive a response within 2 business days.   Please advise at Mobile [phone] (mobile)   Pt stated all her meds will be going to [facility] now.

## 2023-02-01 NOTE — Telephone Encounter (Signed)
refilled 

## 2023-02-01 NOTE — Addendum Note (Signed)
Addended by: Sandy Salaam on: 02/01/2023 05:26 PM   Modules accepted: Orders

## 2023-02-04 ENCOUNTER — Other Ambulatory Visit: Payer: Self-pay

## 2023-02-07 ENCOUNTER — Telehealth: Payer: Self-pay

## 2023-02-07 MED ORDER — TELMISARTAN 40 MG PO TABS: ORAL_TABLET | ORAL | 1 refills | Status: DC

## 2023-02-07 NOTE — Telephone Encounter (Signed)
Rx has been sent to [facility]  

## 2023-02-07 NOTE — Telephone Encounter (Signed)
Patient states she asked [place] to please send her prescription for telmisartan (MICARDIS) 40 MG tablet to [facility] in [place].  I [facility] in [facility] [place].  Patient states she would like for all of her prescriptions to go to [facility] in [place] going forward except her Greggory Keen which she has been getting from the [facility] since they have been having a shortage.  Patient states Sandy Salaam, CMA, is aware of this situation.    Patient would like for [place] to please correct this prescription for telmisartan (MICARDIS) 40 MG tablet and send it to [facility] in [place] so she can pick it up.  Patient states she only has one pill left after she takes the one for today.

## 2023-02-18 LAB — FECAL OCCULT BLOOD, IMMUNOCHEMICAL: IFOBT: NEGATIVE

## 2023-02-28 ENCOUNTER — Other Ambulatory Visit: Payer: Self-pay

## 2023-04-07 ENCOUNTER — Telehealth: Payer: Self-pay | Admitting: Internal Medicine

## 2023-04-07 NOTE — Telephone Encounter (Signed)
Patient just called and said she needs a refill on her medication. tirzepatide (MOUNJARO) 12.5 MG/0.5ML Pen. She said she wants 10 mL and not 12.5ML. She said she is completely out. The pharmacy she use is  [id number] [place], [place] - [address], [place] [address] Phone: [phone]  Fax: [phone] DEA #: [id number]  Her number is [phone]

## 2023-04-11 MED ORDER — TIRZEPATIDE 10 MG/0.5ML ~~LOC~~ SOAJ: 10.0000 mg | SUBCUTANEOUS | 2 refills | Status: DC

## 2023-04-11 NOTE — Telephone Encounter (Signed)
Spoke with pt to let her know that [facility] and she stated that was the wrong [facility].  I have resent the medication to [facility] in [place].

## 2023-04-11 NOTE — Telephone Encounter (Signed)
Penny Butler has been refilled at the lower dose ,  10 mg [facility]

## 2023-04-14 ENCOUNTER — Other Ambulatory Visit (HOSPITAL_COMMUNITY): Payer: Self-pay

## 2023-04-18 NOTE — Telephone Encounter (Signed)
Patient called and is out of her tirzepatide [facility]) 10 MG/0.5ML Pen . Pharmacy told her she needs a Prior Auth. She is not due for next injection until this coming Saturday.  Make sure Shanda Bumps cancels the tirzepatide [facility]) [facility], [facility]

## 2023-04-18 NOTE — Telephone Encounter (Signed)
PA for mounjaro is needed.

## 2023-04-19 ENCOUNTER — Other Ambulatory Visit (HOSPITAL_COMMUNITY): Payer: Self-pay

## 2023-04-20 ENCOUNTER — Other Ambulatory Visit (HOSPITAL_COMMUNITY): Payer: Self-pay

## 2023-04-20 NOTE — Telephone Encounter (Signed)
Pharmacy Patient Advocate Encounter   Received notification from Pt Calls Messages that prior authorization for Mounjaro 10mg  is required/requested.   Insurance verification completed.   The patient is insured through  [employer]  .   Per test claim: The current 84 day co-pay is, $806.02.  No PA needed at this time. This test claim was processed through [facility]- copay amounts may vary at other pharmacies due to pharmacy/plan contracts, or as the patient moves through the different stages of their insurance plan.     *patient is in the coverage gap contributing to high cost

## 2023-04-20 NOTE — Telephone Encounter (Signed)
noted 

## 2023-04-22 NOTE — Telephone Encounter (Signed)
Patient states she is following up with [place] regarding her mounjaro since she has not heard from [place]. Patient states she is out of her mounjaro pens and she is supposed to take her next dose tomorrow.  Patient states she would like for [place] to please call her.   Patient states her preferred pharmacy is [facility] in [place].

## 2023-04-22 NOTE — Telephone Encounter (Signed)
Attempted to call pt. No answer no voicemail.  

## 2023-04-25 NOTE — Telephone Encounter (Signed)
[employer] called and said they need prior authorization on Mounjaro pens for patient. The fax number is [phone].

## 2023-04-25 NOTE — Telephone Encounter (Signed)
noted 

## 2023-04-25 NOTE — Telephone Encounter (Signed)
Patient called and note was read. Patient is going to call her insurance.

## 2023-04-26 ENCOUNTER — Encounter: Payer: Self-pay | Admitting: Internal Medicine

## 2023-04-26 ENCOUNTER — Ambulatory Visit: Payer: Medicare Other | Admitting: Internal Medicine

## 2023-04-26 VITALS — BP 129/77 | HR 110 | Ht 60.0 in | Wt 131.8 lb

## 2023-04-26 DIAGNOSIS — D126 Benign neoplasm of colon, unspecified: Secondary | ICD-10-CM | POA: Diagnosis not present

## 2023-04-26 DIAGNOSIS — E785 Hyperlipidemia, unspecified: Secondary | ICD-10-CM | POA: Diagnosis not present

## 2023-04-26 DIAGNOSIS — E119 Type 2 diabetes mellitus without complications: Secondary | ICD-10-CM

## 2023-04-26 DIAGNOSIS — E1169 Type 2 diabetes mellitus with other specified complication: Secondary | ICD-10-CM | POA: Diagnosis not present

## 2023-04-26 DIAGNOSIS — Z7985 Long-term (current) use of injectable non-insulin antidiabetic drugs: Secondary | ICD-10-CM | POA: Diagnosis not present

## 2023-04-26 DIAGNOSIS — G4733 Obstructive sleep apnea (adult) (pediatric): Secondary | ICD-10-CM | POA: Diagnosis not present

## 2023-04-26 MED ORDER — TELMISARTAN 40 MG PO TABS: ORAL_TABLET | ORAL | 1 refills | Status: DC

## 2023-04-26 NOTE — Patient Instructions (Signed)
Congratulations again on your successful weight management!  You have reached your goal !!  You can increase the interval between doses of Mounjaro if necessary to maintain your current weight.  Some of my patients have found that a dose every 10 days works (and saves you $$$$)  I'm sorry that you have one more colonoscopy to do,  but if this one is clear you will likely not need any more.  The referral to Dr [name] Creek at [facility] has been made

## 2023-04-26 NOTE — Assessment & Plan Note (Addendum)
She has lost 61 lbs using mounjaro and would like to maintain her current rate . She has reduced her dose to 12.5 mg [

## 2023-04-26 NOTE — Assessment & Plan Note (Addendum)
Her last colonoscopy was in 2015 and was not in chart.  A path report , however, notes that a TA without dysplasia was Retrieved on 2015 colonoscopy br D r Mechele Collin , [facility]. She was never contacted for follow up.  She is dismayed at having to have another,  but is willing to since she is now considered overdue .  Referring to Eather Colas for [place] clinic continuity

## 2023-04-26 NOTE — Progress Notes (Signed)
Subjective:  Patient ID: Penny Butler, female    DOB: 1951/04/03  Age: 72 y.o. MRN: [id number]  CC: The primary encounter diagnosis was Hyperlipidemia associated with type 2 diabetes mellitus (HCC). Diagnoses of Tubular adenoma of colon, Diabetes mellitus treated with injections of non-insulin medication (HCC), and OSA (obstructive sleep apnea) were also pertinent to this visit.   HPI Penny Butler presents for  Chief Complaint  Patient presents with   Medical Management of Chronic Issues   Type 2 DM:  patient has been taking Mounjaro for diabetes and weight management and has reduced her dose from 12 mg  because of excessive weight loss .  she is now taking 10 mg and has reached her target BMI.  (Last reached in her 30's)   total weight loss 61 lbs.  She is exercising regularly  with walking and weights.   HTN:  she is taking telmisartan 40 mg daily . Home readings   HM:  mammogram done by gyn    Outpatient Medications Prior to Visit  Medication Sig Dispense Refill   Cyanocobalamin (VITAMIN B-12 SL) Place under the tongue daily.     Multiple Vitamin (MULTIVITAMIN) tablet Take 1 tablet by mouth daily.     OneTouch Delica Lancets 30G MISC USE AS DIRECTED EVERY DAY 100 each 5   ONETOUCH VERIO test strip USE AS DIRECTED ONCE PER DAY 100 strip 5   progesterone (PROMETRIUM) 200 MG capsule Take 200 mg by mouth at bedtime.     rosuvastatin (CRESTOR) 5 MG tablet Take 1 tablet (5 mg total) by mouth daily. 90 tablet 3   tirzepatide (MOUNJARO) 10 MG/0.5ML Pen Inject 10 mg into the skin once a week. 6 mL 2   VITAMIN D PO Take by mouth daily.     telmisartan (MICARDIS) 40 MG tablet TAKE 1 TABLET BY MOUTH EVERYDAY AT BEDTIME 90 tablet 1   No facility-administered medications prior to visit.    Review of Systems;  Patient denies headache, fevers, malaise, unintentional weight loss, skin rash, eye pain, sinus congestion and sinus pain, sore throat, dysphagia,  hemoptysis , cough,  dyspnea, wheezing, chest pain, palpitations, orthopnea, edema, abdominal pain, nausea, melena, diarrhea, constipation, flank pain, dysuria, hematuria, urinary  Frequency, nocturia, numbness, tingling, seizures,  Focal weakness, Loss of consciousness,  Tremor, insomnia, depression, anxiety, and suicidal ideation.      Objective:  BP 129/77   Pulse (!) 110   Ht 5' (1.524 m)   Wt 131 lb 12.8 oz (59.8 kg)   SpO2 99%   BMI 25.74 kg/m   BP Readings from Last 3 Encounters:  04/26/23 129/77  01/05/23 (!) 170/85  12/29/22 (!) 146/64    Wt Readings from Last 3 Encounters:  04/26/23 131 lb 12.8 oz (59.8 kg)  01/05/23 127 lb 8 oz (57.8 kg)  12/29/22 132 lb (59.9 kg)    Physical Exam Vitals reviewed.  Constitutional:      General: She is not in acute distress.    Appearance: Normal appearance. She is normal weight. She is not ill-appearing, toxic-appearing or diaphoretic.  HENT:     Head: Normocephalic.  Eyes:     General: No scleral icterus.       Right eye: No discharge.        Left eye: No discharge.     Conjunctiva/sclera: Conjunctivae normal.  Cardiovascular:     Rate and Rhythm: Normal rate and regular rhythm.     Heart sounds: Normal heart sounds.  Pulmonary:  Effort: Pulmonary effort is normal. No respiratory distress.     Breath sounds: Normal breath sounds.  Musculoskeletal:        General: Normal range of motion.  Skin:    General: Skin is warm and dry.  Neurological:     General: No focal deficit present.     Mental Status: She is alert and oriented to person, place, and time. Mental status is at baseline.  Psychiatric:        Mood and Affect: Mood normal.        Behavior: Behavior normal.        Thought Content: Thought content normal.        Judgment: Judgment normal.    Lab Results  Component Value Date   HGBA1C 5.2 11/08/2022   HGBA1C 5.5 06/02/2022   HGBA1C 6.1 12/01/2021    Lab Results  Component Value Date   CREATININE 0.93 11/08/2022    CREATININE 0.91 06/02/2022   CREATININE 1.05 12/29/2021    Lab Results  Component Value Date   WBC 7.3 11/08/2022   HGB 13.5 11/08/2022   HCT 39.9 11/08/2022   PLT 316.0 11/08/2022   GLUCOSE 86 11/08/2022   CHOL 165 11/08/2022   TRIG 110.0 11/08/2022   HDL 46.80 11/08/2022   LDLDIRECT 97.0 11/08/2022   LDLCALC 97 11/08/2022   ALT 14 11/08/2022   AST 18 11/08/2022   NA 140 11/08/2022   K 3.9 11/08/2022   CL 106 11/08/2022   CREATININE 0.93 11/08/2022   BUN 16 11/08/2022   CO2 25 11/08/2022   TSH 1.91 11/08/2022   HGBA1C 5.2 11/08/2022   MICROALBUR 0.7 11/08/2022    MM 3D SCREENING MAMMOGRAM BILATERAL BREAST  Result Date: 01/25/2023 CLINICAL DATA:  Screening. EXAM: DIGITAL SCREENING BILATERAL MAMMOGRAM WITH TOMOSYNTHESIS AND CAD TECHNIQUE: Bilateral screening digital craniocaudal and mediolateral oblique mammograms were obtained. Bilateral screening digital breast tomosynthesis was performed. The images were evaluated with computer-aided detection. COMPARISON:  Previous exam(s). ACR Breast Density Category b: There are scattered areas of fibroglandular density. FINDINGS: There are no findings suspicious for malignancy. IMPRESSION: No mammographic evidence of malignancy. A result letter of this screening mammogram will be mailed directly to the patient. RECOMMENDATION: Screening mammogram in one year. (Code:SM-B-01Y) BI-RADS CATEGORY  1: Negative. Electronically Signed   By: Jacob Moores M.D.   On: 01/25/2023 11:43    Assessment & Plan:  .Hyperlipidemia associated with type 2 diabetes mellitus (HCC) -     Lipid panel -     LDL cholesterol, direct  Tubular adenoma of colon Assessment & Plan: Her last colonoscopy was in 2015 and was not in chart.  A path report , however, notes that a TA without dysplasia was Retrieved on 2015 colonoscopy br D r Mechele Collin , [facility]. She was never contacted for follow up.  She is dismayed at having to have another,  but is willing to  since she is now considered overdue .  Referring to Eather Colas for [place] clinic continuity   Orders: -     Ambulatory referral to Gastroenterology  Diabetes mellitus treated with injections of non-insulin medication [facility]) Assessment & Plan: She has lost 61 lbs using mounjaro and would like to maintain her current rate   Orders: -     Comprehensive metabolic panel -     Hemoglobin A1c  OSA (obstructive sleep apnea) Assessment & Plan: She stopped using CPAP in January after achieving  55 lbs  and has no symptoms /  she decliness repeat study    Other orders -     Telmisartan; TAKE 1 TABLET BY MOUTH EVERYDAY AT BEDTIME  Dispense: 90 tablet; Refill: 1     I provided 30 minutes of face-to-face time during this encounter reviewing patient's last visit with me, patient's    recent surgical and non surgical procedures, previous  labs and imaging studies, counseling on currently addressed issues,  and post visit ordering to diagnostics and therapeutics .   Follow-up: No follow-ups on file.   Sherlene Shams, MD

## 2023-04-26 NOTE — Assessment & Plan Note (Addendum)
She stopped using CPAP in January after achieving  55 lbs  and has no symptoms /   she decliness repeat study

## 2023-04-27 LAB — LIPID PANEL
Cholesterol: 162 mg/dL (ref 0–200)
HDL: 43.1 mg/dL (ref >39.00)
LDL Cholesterol: 87 mg/dL (ref 0–99)
NonHDL: 118.83
Total CHOL/HDL Ratio: 4
Triglycerides: 160 mg/dL — ABNORMAL HIGH (ref 0.0–149.0)
VLDL: 32 mg/dL (ref 0.0–40.0)

## 2023-04-27 LAB — HEMOGLOBIN A1C: Hgb A1c MFr Bld: 5.2 % (ref 4.6–6.5)

## 2023-04-27 LAB — LDL CHOLESTEROL, DIRECT: Direct LDL: 90 mg/dL

## 2023-04-27 LAB — COMPREHENSIVE METABOLIC PANEL
ALT: 12 U/L (ref 0–35)
AST: 15 U/L (ref 0–37)
Albumin: 4.1 g/dL (ref 3.5–5.2)
Alkaline Phosphatase: 80 U/L (ref 39–117)
BUN: 23 mg/dL (ref 6–23)
CO2: 28 meq/L (ref 19–32)
Calcium: 10.3 mg/dL (ref 8.4–10.5)
Chloride: 106 meq/L (ref 96–112)
Creatinine, Ser: 1.07 mg/dL (ref 0.40–1.20)
GFR: 52.86 mL/min — ABNORMAL LOW (ref >60.00)
Glucose, Bld: 88 mg/dL (ref 70–99)
Potassium: 4 meq/L (ref 3.5–5.1)
Sodium: 141 meq/L (ref 135–145)
Total Bilirubin: 0.4 mg/dL (ref 0.2–1.2)
Total Protein: 6.5 g/dL (ref 6.0–8.3)

## 2023-04-29 ENCOUNTER — Encounter: Payer: Self-pay | Admitting: Pharmacist

## 2023-04-29 NOTE — Progress Notes (Signed)
Pharmacy Quality Measure Review  This patient is appearing on report for being at risk of failing the measure for Statin Use in Persons with Diabetes (SUPD) medications this calendar year.   Epic fill hx shows multiple refills in 2024. SUPD list showing no refills in 2024.  Dr. Tiajuana Amass online portal showing no medications for this patient, though when meddile initial is included in their first name, statin fill hx is visible.  Last fill rosuvastatin 5 mg daily, 90ds on 07/06/22, 10/03/22, 01/04/23.   Patient incorrectly placed on SUPD list. No further action at this time.   Loree Fee, PharmD Clinical Pharmacist [facility] [phone]

## 2023-05-02 ENCOUNTER — Telehealth: Payer: Self-pay

## 2023-05-02 MED ORDER — ROSUVASTATIN CALCIUM 5 MG PO TABS: 5.0000 mg | ORAL_TABLET | Freq: Every day | ORAL | 3 refills | Status: DC

## 2023-05-02 NOTE — Telephone Encounter (Signed)
Medication has been refilled.

## 2023-05-02 NOTE — Telephone Encounter (Signed)
Patient states she was here last Tuesday for a follow-up appointment and we forgot to send in her prescription for rosuvastatin (CRESTOR) 5 MG tablet.  Patient states Dr. Duncan Dull wanted to see how her bloodwork turned out before she sent in the prescription because it might need to be adjusted.  Patient states she took her last tablet last night, so she is out of this medication.  Prescription Request  05/02/2023  LOV: Visit date not found  What is the name of the medication or equipment? rosuvastatin (CRESTOR) 5 MG tablet  Have you contacted your pharmacy to request a refill? Yes   Which pharmacy would you like this sent to?   [id number] [place], [place] - [address] AT [facility] OF [address] [place] [address] Phone: [phone] Fax: [phone]    Patient notified that their request is being sent to the clinical staff for review and that they should receive a response within 2 business days.   Please advise at Mobile [phone] (mobile)

## 2023-05-20 ENCOUNTER — Other Ambulatory Visit (HOSPITAL_COMMUNITY): Payer: Self-pay

## 2023-05-20 ENCOUNTER — Telehealth: Payer: Self-pay

## 2023-05-20 NOTE — Telephone Encounter (Signed)
Pharmacy Patient Advocate Encounter   Received notification from CoverMyMeds that prior authorization for Mounjaro 10MG /0.5ML auto-injectors is required/requested.   Insurance verification completed.   The patient is insured through [facility] .   Per test claim: Refill too soon. PA is not needed at this time. Medication was filled 05/17/2023. Next eligible fill date is 06/07/2023.

## 2023-05-24 ENCOUNTER — Telehealth: Payer: Self-pay | Admitting: Internal Medicine

## 2023-05-24 NOTE — Telephone Encounter (Signed)
Pt called stating she has not heard back from [place] [facility] about her colonscopy

## 2023-05-25 NOTE — Telephone Encounter (Signed)
Pt was given the number to [facility] to call them to get scheduled.

## 2023-06-27 ENCOUNTER — Other Ambulatory Visit (HOSPITAL_COMMUNITY): Payer: Self-pay

## 2023-06-28 ENCOUNTER — Other Ambulatory Visit: Payer: Self-pay

## 2023-06-28 MED ORDER — TIRZEPATIDE 10 MG/0.5ML ~~LOC~~ SOAJ: 10.0000 mg | SUBCUTANEOUS | 2 refills | Status: DC

## 2023-07-05 ENCOUNTER — Telehealth: Payer: Self-pay | Admitting: Internal Medicine

## 2023-07-05 ENCOUNTER — Other Ambulatory Visit: Payer: Self-pay

## 2023-07-05 MED ORDER — TIRZEPATIDE 12.5 MG/0.5ML ~~LOC~~ SOAJ
12.5000 mg | SUBCUTANEOUS | 2 refills | Status: DC
  Filled 2023-07-05: qty 2, 28d supply, fill #0

## 2023-07-05 NOTE — Telephone Encounter (Signed)
 Mounjaro dose increased to 12.5 mg and sent to pharmacy

## 2023-07-05 NOTE — Telephone Encounter (Signed)
 Patient needs her mounjaro  needs 12.5 she would like a call back regarding this medication [facility] [place]

## 2023-07-06 NOTE — Telephone Encounter (Signed)
 Pt.notified

## 2023-07-15 ENCOUNTER — Telehealth: Payer: Self-pay

## 2023-07-15 ENCOUNTER — Other Ambulatory Visit: Payer: Self-pay | Admitting: Internal Medicine

## 2023-07-15 NOTE — Telephone Encounter (Signed)
Copied from CRM [id number]. Topic: Clinical - Medication Refill >> Jul 15, 2023  4:46 PM Eunice Blase wrote: Most Recent Primary Care Visit:  Provider: Sherlene Shams  Department: [facility]  Visit Type: OFFICE VISIT  Date: 04/26/2023  Medication: tirzepatide [facility]) 12.5 MG/0.5ML Pen  Has the patient contacted their pharmacy? Yes (Agent: If no, request that the patient contact the pharmacy for the refill. If patient does not wish to contact the pharmacy document the reason why and proceed with request.) (Agent: If yes, when and what did the pharmacy advise?)  Is this the correct pharmacy for this prescription? Yes If no, delete pharmacy and type the correct one.  This is the patient's preferred [facility] [address] [place] [address] Ph: [phone]  Has the prescription been filled recently? Yes  Is the patient out of the medication? Yes  Has the patient been seen for an appointment in the last year OR does the patient have an upcoming appointment? Yes  Can we respond through MyChart? Yes  Agent: Please be advised that Rx refills may take up to 3 business days. We ask that you follow-up with your pharmacy.

## 2023-07-18 ENCOUNTER — Other Ambulatory Visit: Payer: Self-pay

## 2023-07-18 MED ORDER — TIRZEPATIDE 12.5 MG/0.5ML ~~LOC~~ SOAJ: 12.5000 mg | SUBCUTANEOUS | 2 refills | Status: DC

## 2023-07-18 NOTE — Telephone Encounter (Signed)
[facility] [place]

## 2023-07-18 NOTE — Addendum Note (Signed)
Addended by: Sandy Salaam on: 07/18/2023 01:41 PM   Modules accepted: Orders

## 2023-07-19 ENCOUNTER — Telehealth: Payer: Self-pay

## 2023-07-19 NOTE — Telephone Encounter (Signed)
Copied from CRM [id number]. Topic: Clinical - Prescription Issue >> Jul 19, 2023  9:56 AM Ernst Spell wrote: Reason for CRM: pt called and stated that her pharmacy is needing a Prior Auth for her prescription for [facility]. Please advise and call patient when PA has been completed.

## 2023-07-19 NOTE — Telephone Encounter (Signed)
PA needed for [facility]

## 2023-07-21 ENCOUNTER — Telehealth: Payer: Self-pay

## 2023-07-21 ENCOUNTER — Other Ambulatory Visit (HOSPITAL_COMMUNITY): Payer: Self-pay

## 2023-07-21 NOTE — Telephone Encounter (Signed)
Pharmacy Patient Advocate Encounter   Received notification from Pt Calls Messages that prior authorization for [facility] is required/requested.   Insurance verification completed.   The patient is insured through [facility] .   Per test claim: The current 28 day co-pay is, $141.  No PA needed at this time. This test claim was processed through [facility]- copay amounts may vary at other pharmacies due to pharmacy/plan contracts, or as the patient moves through the different stages of their insurance plan.

## 2023-07-22 ENCOUNTER — Other Ambulatory Visit: Payer: Self-pay | Admitting: Internal Medicine

## 2023-07-22 NOTE — Telephone Encounter (Signed)
Last Fill: 07/18/23  Last OV: 05/16/23 Next OV: None Scheduled  Routing to provider for review/authorization.

## 2023-07-22 NOTE — Telephone Encounter (Signed)
Copied from CRM [id number]. Topic: Clinical - Medication Refill >> Jul 22, 2023  1:01 PM Thomes Dinning wrote: Most Recent Primary Care Visit:  Provider: Sherlene Shams  Department: [facility]  Visit Type: OFFICE VISIT  Date: 04/26/2023  Medication: tirzepatide [facility]) 12.5 MG/0.5ML Pen  Has the patient contacted their pharmacy? Yes (Agent: If no, request that the patient contact the pharmacy for the refill. If patient does not wish to contact the pharmacy document the reason why and proceed with request.) (Agent: If yes, when and what did the pharmacy advise?)  Is this the correct pharmacy for this prescription? Yes If no, delete pharmacy and type the correct one.  This is the patient's preferred pharmacy:   [facility] [phone] [address] [place] [address]  Has the prescription been filled recently? Yes  Is the patient out of the medication? Yes  Has the patient been seen for an appointment in the last year OR does the patient have an upcoming appointment? Yes  Can we respond through MyChart? Yes  Agent: Please be advised that Rx refills may take up to 3 business days. We ask that you follow-up with your pharmacy.

## 2023-07-22 NOTE — Telephone Encounter (Signed)
 noted

## 2023-08-11 DIAGNOSIS — R5382 Chronic fatigue, unspecified: Secondary | ICD-10-CM | POA: Diagnosis not present

## 2023-11-15 DIAGNOSIS — Z0189 Encounter for other specified special examinations: Secondary | ICD-10-CM | POA: Diagnosis not present

## 2023-11-18 ENCOUNTER — Other Ambulatory Visit: Payer: Self-pay | Admitting: Internal Medicine

## 2023-11-18 DIAGNOSIS — E1121 Type 2 diabetes mellitus with diabetic nephropathy: Secondary | ICD-10-CM

## 2023-12-13 ENCOUNTER — Ambulatory Visit

## 2023-12-14 DIAGNOSIS — I10 Essential (primary) hypertension: Secondary | ICD-10-CM | POA: Diagnosis not present

## 2023-12-14 DIAGNOSIS — Z961 Presence of intraocular lens: Secondary | ICD-10-CM | POA: Diagnosis not present

## 2023-12-14 DIAGNOSIS — E119 Type 2 diabetes mellitus without complications: Secondary | ICD-10-CM | POA: Diagnosis not present

## 2023-12-14 LAB — HM DIABETES EYE EXAM

## 2023-12-29 ENCOUNTER — Other Ambulatory Visit: Payer: Self-pay | Admitting: Internal Medicine

## 2023-12-29 DIAGNOSIS — Z1231 Encounter for screening mammogram for malignant neoplasm of breast: Secondary | ICD-10-CM

## 2024-01-02 ENCOUNTER — Encounter: Payer: Self-pay | Admitting: Internal Medicine

## 2024-01-02 ENCOUNTER — Other Ambulatory Visit: Payer: Self-pay | Admitting: Internal Medicine

## 2024-01-11 DIAGNOSIS — R5382 Chronic fatigue, unspecified: Secondary | ICD-10-CM | POA: Diagnosis not present

## 2024-01-11 DIAGNOSIS — R232 Flushing: Secondary | ICD-10-CM | POA: Diagnosis not present

## 2024-01-25 ENCOUNTER — Ambulatory Visit
Admission: RE | Admit: 2024-01-25 | Discharge: 2024-01-25 | Disposition: A | Discharge status: Home / Self Care | Source: Ambulatory Visit | Attending: Internal Medicine | Admitting: Internal Medicine

## 2024-01-25 DIAGNOSIS — Z1231 Encounter for screening mammogram for malignant neoplasm of breast: Secondary | ICD-10-CM | POA: Diagnosis not present

## 2024-02-07 ENCOUNTER — Ambulatory Visit: Payer: Self-pay | Admitting: Internal Medicine

## 2024-02-14 ENCOUNTER — Encounter: Payer: Self-pay | Admitting: Internal Medicine

## 2024-02-14 ENCOUNTER — Ambulatory Visit (INDEPENDENT_AMBULATORY_CARE_PROVIDER_SITE_OTHER): Admitting: Internal Medicine

## 2024-02-14 ENCOUNTER — Telehealth: Payer: Self-pay | Admitting: Internal Medicine

## 2024-02-14 ENCOUNTER — Telehealth: Payer: Self-pay

## 2024-02-14 VITALS — BP 140/70 | HR 93 | Ht 60.0 in | Wt 134.0 lb

## 2024-02-14 DIAGNOSIS — R5383 Other fatigue: Secondary | ICD-10-CM | POA: Diagnosis not present

## 2024-02-14 DIAGNOSIS — I10 Essential (primary) hypertension: Secondary | ICD-10-CM

## 2024-02-14 DIAGNOSIS — E1121 Type 2 diabetes mellitus with diabetic nephropathy: Secondary | ICD-10-CM | POA: Diagnosis not present

## 2024-02-14 DIAGNOSIS — E785 Hyperlipidemia, unspecified: Secondary | ICD-10-CM

## 2024-02-14 DIAGNOSIS — Z7985 Long-term (current) use of injectable non-insulin antidiabetic drugs: Secondary | ICD-10-CM | POA: Diagnosis not present

## 2024-02-14 DIAGNOSIS — E1169 Type 2 diabetes mellitus with other specified complication: Secondary | ICD-10-CM

## 2024-02-14 DIAGNOSIS — Z23 Encounter for immunization: Secondary | ICD-10-CM | POA: Diagnosis not present

## 2024-02-14 DIAGNOSIS — Z1211 Encounter for screening for malignant neoplasm of colon: Secondary | ICD-10-CM

## 2024-02-14 DIAGNOSIS — Z0000 Encounter for general adult medical examination without abnormal findings: Secondary | ICD-10-CM

## 2024-02-14 DIAGNOSIS — E559 Vitamin D deficiency, unspecified: Secondary | ICD-10-CM

## 2024-02-14 LAB — TSH: TSH: 1.91 u[IU]/mL (ref 0.35–5.50)

## 2024-02-14 LAB — CBC WITH DIFFERENTIAL/PLATELET
Basophils Absolute: 0.1 K/uL (ref 0.0–0.1)
Basophils Relative: 0.8 % (ref 0.0–3.0)
Eosinophils Absolute: 0.1 K/uL (ref 0.0–0.7)
Eosinophils Relative: 1.3 % (ref 0.0–5.0)
HCT: 40.2 % (ref 36.0–46.0)
Hemoglobin: 13.4 g/dL (ref 12.0–15.0)
Lymphocytes Relative: 32 % (ref 12.0–46.0)
Lymphs Abs: 2.1 K/uL (ref 0.7–4.0)
MCHC: 33.4 g/dL (ref 30.0–36.0)
MCV: 84.5 fl (ref 78.0–100.0)
Monocytes Absolute: 0.7 K/uL (ref 0.1–1.0)
Monocytes Relative: 10.3 % (ref 3.0–12.0)
Neutro Abs: 3.6 K/uL (ref 1.4–7.7)
Neutrophils Relative %: 55.6 % (ref 43.0–77.0)
Platelets: 274 K/uL (ref 150.0–400.0)
RBC: 4.76 Mil/uL (ref 3.87–5.11)
RDW: 13.2 % (ref 11.5–15.5)
WBC: 6.6 K/uL (ref 4.0–10.5)

## 2024-02-14 LAB — COMPREHENSIVE METABOLIC PANEL WITH GFR
ALT: 14 U/L (ref 0–35)
AST: 16 U/L (ref 0–37)
Albumin: 4.4 g/dL (ref 3.5–5.2)
Alkaline Phosphatase: 72 U/L (ref 39–117)
BUN: 19 mg/dL (ref 6–23)
CO2: 27 meq/L (ref 19–32)
Calcium: 9.9 mg/dL (ref 8.4–10.5)
Chloride: 106 meq/L (ref 96–112)
Creatinine, Ser: 0.91 mg/dL (ref 0.40–1.20)
GFR: 63.85 mL/min (ref >60.00)
Glucose, Bld: 82 mg/dL (ref 70–99)
Potassium: 5.1 meq/L (ref 3.5–5.1)
Sodium: 142 meq/L (ref 135–145)
Total Bilirubin: 0.6 mg/dL (ref 0.2–1.2)
Total Protein: 6.9 g/dL (ref 6.0–8.3)

## 2024-02-14 LAB — MICROALBUMIN / CREATININE URINE RATIO
Creatinine,U: 101.1 mg/dL
Microalb Creat Ratio: 34.9 mg/g — ABNORMAL HIGH (ref 0.0–30.0)
Microalb, Ur: 3.5 mg/dL — ABNORMAL HIGH (ref 0.0–1.9)

## 2024-02-14 LAB — LIPID PANEL
Cholesterol: 149 mg/dL (ref 0–200)
HDL: 47 mg/dL (ref >39.00)
LDL Cholesterol: 85 mg/dL (ref 0–99)
NonHDL: 102.12
Total CHOL/HDL Ratio: 3
Triglycerides: 85 mg/dL (ref 0.0–149.0)
VLDL: 17 mg/dL (ref 0.0–40.0)

## 2024-02-14 LAB — HEMOGLOBIN A1C: Hgb A1c MFr Bld: 5.4 % (ref 4.6–6.5)

## 2024-02-14 LAB — LDL CHOLESTEROL, DIRECT: Direct LDL: 89 mg/dL

## 2024-02-14 LAB — VITAMIN D 25 HYDROXY (VIT D DEFICIENCY, FRACTURES): VITD: >120 ng/mL

## 2024-02-14 MED ORDER — TELMISARTAN-AMLODIPINE 40-5 MG PO TABS: 1.0000 | ORAL_TABLET | Freq: Every day | ORAL | 1 refills | Status: AC

## 2024-02-14 MED ORDER — MOUNJARO 12.5 MG/0.5ML ~~LOC~~ SOAJ: SUBCUTANEOUS | 0 refills | Status: DC

## 2024-02-14 NOTE — Assessment & Plan Note (Addendum)
 Currently well-controlled on current medications .  hemoglobin A1c is at goal of less than 7.0 . Patient is reminded to schedule an annual eye exam and foot exam is normal today. Patient has no microalbuminuria. Patient is tolerating statin therapy for CAD risk reduction and on ACE/ARB for renal protection and hypertension  She is using 12.5 mounjaro  every 10 to  12 days to maintain current BMI   Lab Results  Component Value Date   HGBA1C 5.4 02/14/2024   Lab Results  Component Value Date   CREATININE 0.91 02/14/2024   Lab Results  Component Value Date   CHOL 149 02/14/2024   HDL 47.00 02/14/2024   LDLCALC 85 02/14/2024   LDLDIRECT 89.0 02/14/2024   TRIG 85.0 02/14/2024   CHOLHDL 3 02/14/2024

## 2024-02-14 NOTE — Assessment & Plan Note (Signed)

## 2024-02-14 NOTE — Telephone Encounter (Signed)
 Pt is aware and gave a verbal understanding. Lab appt has been scheduled for 05/16/2024.

## 2024-02-14 NOTE — Progress Notes (Signed)
 Patient ID: Penny Butler, female    DOB: 1950/08/23  Age: 73 y.o. MRN: [id number]  The patient is here for annual preventive examination and management of other chronic and acute problems.   The risk factors are reflected in the social history.   The roster of all physicians providing medical care to patient - is listed in the Snapshot section of the chart.   Activities of daily living:  The patient is 100% independent in all ADLs: dressing, toileting, feeding as well as independent mobility   Home safety : The patient has smoke detectors in the home. They wear seatbelts.  There are no unsecured firearms at home. There is no violence in the home.    There is no risks for hepatitis, STDs or HIV. There is no   history of blood transfusion. They have no travel history to infectious disease endemic areas of the world.   The patient has seen their dentist in the last six month. They have seen their eye doctor in the last year. The patinet  denies slight hearing difficulty with regard to whispered voices and some television programs.  They have deferred audiologic testing in the last year.  They do not  have excessive sun exposure. Discussed the need for sun protection: hats, long sleeves and use of sunscreen if there is significant sun exposure.    Diet: the importance of a healthy diet is discussed. They do have a healthy diet.   The benefits of regular aerobic exercise were discussed. The patient  exercises  3 to 5 days per week  for  60 minutes.    Depression screen: there are no signs or vegative symptoms of depression- irritability, change in appetite, anhedonia, sadness/tearfullness.   The following portions of the patient's history were reviewed and updated as appropriate: allergies, current medications, past family history, past medical history,  past surgical history, past social history  and problem list.   Visual acuity was not assessed per patient preference since the patient has  regular follow up with an  ophthalmologist. Hearing and body mass index were assessed and reviewed.    During the course of the visit the patient was educated and counseled about appropriate screening and preventive services including : fall prevention , diabetes screening, nutrition counseling, colorectal cancer screening, and recommended immunizations.    Chief Complaint:  Elevated blood pressure readings :  She has had several elevated readings. At home   She feels generally well, is exercising several times per week and checking blood sugars once daily at variable times.  BS have been under 130 fasting and < 150 post prandially.  Denies any recent hypoglyemic events.  Taking his medications as directed. Following a carbohydrate modified diet 6 days per week. Denies numbness, burning and tingling of extremities. Appetite is diminished using Mounjaro . She has reached her target weight and has increased the interval between injections           Review of Symptoms  Patient denies headache, fevers, malaise, unintentional weight loss, skin rash, eye pain, sinus congestion and sinus pain, sore throat, dysphagia,  hemoptysis , cough, dyspnea, wheezing, chest pain, palpitations, orthopnea, edema, abdominal pain, nausea, melena, diarrhea, constipation, flank pain, dysuria, hematuria, urinary  Frequency, nocturia, numbness, tingling, seizures,  Focal weakness, Loss of consciousness,  Tremor, insomnia, depression, anxiety, and suicidal ideation.    Physical Exam:  BP (!) 140/70   Pulse 93   Ht 5' (1.524 m)   Wt 134 lb (60.8 kg)  SpO2 98%   BMI 26.17 kg/m    Physical Exam Vitals reviewed.  Constitutional:      General: She is not in acute distress.    Appearance: Normal appearance. She is normal weight. She is not ill-appearing, toxic-appearing or diaphoretic.  HENT:     Head: Normocephalic.  Eyes:     General: No scleral icterus.       Right eye: No discharge.        Left eye: No  discharge.     Conjunctiva/sclera: Conjunctivae normal.  Cardiovascular:     Rate and Rhythm: Normal rate and regular rhythm.     Heart sounds: Normal heart sounds.  Pulmonary:     Effort: Pulmonary effort is normal. No respiratory distress.     Breath sounds: Normal breath sounds.  Musculoskeletal:        General: Normal range of motion.  Skin:    General: Skin is warm and dry.  Neurological:     General: No focal deficit present.     Mental Status: She is alert and oriented to person, place, and time. Mental status is at baseline.  Psychiatric:        Mood and Affect: Mood normal.        Behavior: Behavior normal.        Thought Content: Thought content normal.        Judgment: Judgment normal.     Assessment and Plan: Hyperlipidemia associated with type 2 diabetes mellitus (HCC) -     Lipid panel -     LDL cholesterol, direct  Diabetes mellitus treated with injections of non-insulin  medication (HCC) Assessment & Plan: Currently well-controlled on current medications .  hemoglobin A1c is at goal of less than 7.0 . Patient is reminded to schedule an annual eye exam and foot exam is normal today. Patient has no microalbuminuria. Patient is tolerating statin therapy for CAD risk reduction and on ACE/ARB for renal protection and hypertension  She is using 12.5 mounjaro  every 10 to  12 days to maintain current BMI   Lab Results  Component Value Date   HGBA1C 5.4 02/14/2024   Lab Results  Component Value Date   CREATININE 0.91 02/14/2024   Lab Results  Component Value Date   CHOL 149 02/14/2024   HDL 47.00 02/14/2024   LDLCALC 85 02/14/2024   LDLDIRECT 89.0 02/14/2024   TRIG 85.0 02/14/2024   CHOLHDL 3 02/14/2024     Orders: -     Microalbumin / creatinine urine ratio -     Hemoglobin A1c -     Comprehensive metabolic panel with GFR -     Pneumococcal conjugate vaccine 20-valent  Type 2 diabetes mellitus with diabetic nephropathy (HCC) -     Mounjaro ; INJECT 12.5  MG SUBCUTANEOUSLY ONE TIME PER WEEK  Dispense: 6 mL; Refill: 0  Other fatigue -     CBC with Differential/Platelet -     TSH  Need for vaccination against Streptococcus pneumoniae  Vitamin D  insufficiency -     VITAMIN D  25 Hydroxy (Vit-D Deficiency, Fractures)  Colon cancer screening -     Cologuard  Elevated blood pressure reading in office with diagnosis of hypertension Assessment & Plan: Home readings are also above 130/90.  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  . Patient has microalbuminuria. AND A RATIO OF 35.  Patient is tolerating statin therapy for CAD risk reduction and on ACE/ARB for renal protection and hypertension    Last metabolic panel Lab Results  Component Value Date   GLUCOSE 82 02/14/2024   NA 142 02/14/2024   K 5.1 02/14/2024   CL 106 02/14/2024   CO2 27 02/14/2024   BUN 19 02/14/2024   CREATININE 0.91 02/14/2024   GFR 63.85 02/14/2024   CALCIUM  9.9 02/14/2024   PROT 6.9 02/14/2024   ALBUMIN 4.4 02/14/2024   BILITOT 0.6 02/14/2024   ALKPHOS 72 02/14/2024   AST 16 02/14/2024   ALT 14 02/14/2024      Need for vaccination  Encounter for preventive health examination Assessment & Plan: age appropriate education and counseling updated, referrals for preventative services and immunizations addressed, dietary and smoking counseling addressed, most recent labs reviewed.  I have personally reviewed and have noted:   1) the patient's medical and social history 2) The pt's use of alcohol, tobacco, and illicit drugs 3) The patient's current medications and supplements 4) Functional ability including ADL's, fall risk, home safety risk, hearing and visual impairment 5) Diet and physical activities 6) Evidence for depression or mood disorder 7) The patient's height, weight, and BMI have  been recorded in the chart    I have made referrals, and provided counseling and education based on review of the above    Other orders -     Telmisartan -amLODIPine ; Take 1 tablet by mouth at bedtime.  Dispense: 30 tablet; Refill: 1    Return in about 6 months (around 08/16/2024) for follow up diabetes.  Penny LITTIE Kettering, MD

## 2024-02-14 NOTE — Telephone Encounter (Signed)
 CRITICAL VALUE STICKER  CRITICAL VALUE: VITAMIN D  GREATER THEN 120  RECEIVER (on-site recipient of call): Jason Frisbee K, RMA  DATE & TIME NOTIFIED:  3:04pm 02/14/24  MESSENGER (representative from lab): Saa  MD NOTIFIED:  Dr. Marylynn   TIME OF NOTIFICATION: 3:06pm

## 2024-02-14 NOTE — Patient Instructions (Signed)
 I am changing your blood pressure to  a combination of telmsartan and  amlodipine .  Take it once daily at night    Our goal is to keep your  BP  between  120/70 to 130/80  You received the prevnar 20 pneumonia vaccine today

## 2024-02-14 NOTE — Telephone Encounter (Signed)
 Vitamin  D level is throught the roof!  /critically high.  Please stopaALL all vitamin supplementation.  Repeat level in 3 months

## 2024-02-14 NOTE — Assessment & Plan Note (Addendum)
 Her diabetes is Currently well-controlled on  tirzepitide , telmisartan  and rosuvastatin  , but she has  microalbuminuria. AND A RATIO OF 35 despite taking telmisartan .  Recommending addition of Jardiance .   Patient is tolerating statin therapy for CAD risk reduction and on ACE/ARB for renal protection and hypertension    Last metabolic panel Lab Results  Component Value Date   GLUCOSE 82 02/14/2024   NA 142 02/14/2024   K 5.1 02/14/2024   CL 106 02/14/2024   CO2 27 02/14/2024   BUN 19 02/14/2024   CREATININE 0.91 02/14/2024   GFR 63.85 02/14/2024   CALCIUM  9.9 02/14/2024   PROT 6.9 02/14/2024   ALBUMIN 4.4 02/14/2024   BILITOT 0.6 02/14/2024   ALKPHOS 72 02/14/2024   AST 16 02/14/2024   ALT 14 02/14/2024

## 2024-02-14 NOTE — Assessment & Plan Note (Signed)
 Home readings are also above 130/90.  Andding 5 mg amlodipine  in combination medication

## 2024-02-16 ENCOUNTER — Ambulatory Visit: Payer: Self-pay | Admitting: Internal Medicine

## 2024-02-16 ENCOUNTER — Other Ambulatory Visit (HOSPITAL_COMMUNITY): Payer: Self-pay

## 2024-02-16 ENCOUNTER — Telehealth: Payer: Self-pay

## 2024-02-16 DIAGNOSIS — E1121 Type 2 diabetes mellitus with diabetic nephropathy: Secondary | ICD-10-CM

## 2024-02-16 MED ORDER — EMPAGLIFLOZIN 10 MG PO TABS
10.0000 mg | ORAL_TABLET | Freq: Every day | ORAL | 2 refills | Status: DC
  Filled 2024-02-16: qty 30, 30d supply, fill #0

## 2024-02-16 NOTE — Telephone Encounter (Signed)
 Copied from CRM [id number]. Topic: Clinical - Prescription Issue >> Feb 16, 2024  4:18 PM Penny Butler wrote: Reason for CRM: Patients refill for the empagliflozin  (JARDIANCE ) 10 MG TABS tablet, was sent to wrong pharmacy ,It is supposed to be sent to  [id number] [place], [place] - [address] [place] [address] Phone: [phone] Fax: [phone] Hours: Not open 24 hours  She needs this corrected as soon as possible.

## 2024-02-17 ENCOUNTER — Other Ambulatory Visit (HOSPITAL_COMMUNITY): Payer: Self-pay

## 2024-02-17 MED ORDER — EMPAGLIFLOZIN 10 MG PO TABS
10.0000 mg | ORAL_TABLET | Freq: Every day | ORAL | 2 refills | Status: DC
Start: 2024-02-17 — End: 2024-02-25

## 2024-02-17 NOTE — Addendum Note (Signed)
 Addended by: HARRIETTE RAISIN on: 02/17/2024 08:47 AM   Modules accepted: Orders

## 2024-02-17 NOTE — Telephone Encounter (Signed)
 [facility] [place] Pt is aware.

## 2024-02-21 ENCOUNTER — Telehealth: Payer: Self-pay

## 2024-02-21 DIAGNOSIS — E1122 Type 2 diabetes mellitus with diabetic chronic kidney disease: Secondary | ICD-10-CM

## 2024-02-21 DIAGNOSIS — E1121 Type 2 diabetes mellitus with diabetic nephropathy: Secondary | ICD-10-CM

## 2024-02-21 NOTE — Telephone Encounter (Signed)
 Copied from CRM [id number]. Topic: Clinical - Medical Advice >> Feb 21, 2024  9:31 AM Berneda FALCON wrote: Reason for CRM: Pt would like Dr. Marylynn to know that the Jardiance  made her very sick and she cannot take it. Started it on her for high protein in her urine. She took it Friday, but did not take it Saturday, Sunday, or Monday due to it making her sick so she is not currently on any medications and would like PCP to know this. She is working on changing her lifestyle.

## 2024-02-24 ENCOUNTER — Telehealth: Payer: Self-pay

## 2024-02-24 NOTE — Telephone Encounter (Signed)
 Copied from CRM [id number]. Topic: Referral - Status >> Feb 24, 2024  8:40 AM Tiffany S wrote: Reason for CRM: Patient is requesting a referral to the Nephrology office listed below   Dr  Woodward Brought, MD [facility]   [email or web address] [address] [place], [place] [address] [phone]

## 2024-02-24 NOTE — Telephone Encounter (Signed)
 Spoke with patient to clarify what symptoms she was experiencing when taking the Jardiance . Patient states she was sick all over, feeling drugged and tired and fatigue. Patient state she has stopped the medication and says she is NOT taking the medication anymore. Patient says she has also called to have a referral placed to [facility] in [place] to Dr Douglas, MD since her husband is already a patient there. Please advise?

## 2024-02-25 NOTE — Assessment & Plan Note (Signed)
 Trial of Jardiance  not tolerated due to profound fatigue, feeling drugged after one dose.  Referring to nephrology in process

## 2024-02-25 NOTE — Addendum Note (Signed)
 Addended by: MARYLYNN VERNEITA CROME on: 02/25/2024 10:43 AM   Modules accepted: Orders

## 2024-02-26 LAB — COLOGUARD: COLOGUARD: NEGATIVE

## 2024-02-27 NOTE — Telephone Encounter (Signed)
 Spoke with pt and she stated that she has already spoken with the nephrology office.

## 2024-03-01 ENCOUNTER — Other Ambulatory Visit: Payer: Self-pay | Admitting: Internal Medicine

## 2024-03-01 DIAGNOSIS — E785 Hyperlipidemia, unspecified: Secondary | ICD-10-CM | POA: Diagnosis not present

## 2024-03-01 DIAGNOSIS — N1831 Chronic kidney disease, stage 3a: Secondary | ICD-10-CM | POA: Diagnosis not present

## 2024-03-01 DIAGNOSIS — I10 Essential (primary) hypertension: Secondary | ICD-10-CM | POA: Diagnosis not present

## 2024-03-01 DIAGNOSIS — R809 Proteinuria, unspecified: Secondary | ICD-10-CM | POA: Diagnosis not present

## 2024-03-01 DIAGNOSIS — E1129 Type 2 diabetes mellitus with other diabetic kidney complication: Secondary | ICD-10-CM | POA: Diagnosis not present

## 2024-03-15 ENCOUNTER — Ambulatory Visit: Admitting: Internal Medicine

## 2024-04-02 DIAGNOSIS — N1831 Chronic kidney disease, stage 3a: Secondary | ICD-10-CM | POA: Diagnosis not present

## 2024-04-02 DIAGNOSIS — E785 Hyperlipidemia, unspecified: Secondary | ICD-10-CM | POA: Diagnosis not present

## 2024-04-02 DIAGNOSIS — E1129 Type 2 diabetes mellitus with other diabetic kidney complication: Secondary | ICD-10-CM | POA: Diagnosis not present

## 2024-04-02 DIAGNOSIS — I10 Essential (primary) hypertension: Secondary | ICD-10-CM | POA: Diagnosis not present

## 2024-04-02 DIAGNOSIS — R809 Proteinuria, unspecified: Secondary | ICD-10-CM | POA: Diagnosis not present

## 2024-05-06 ENCOUNTER — Telehealth: Payer: Self-pay | Admitting: *Deleted

## 2024-05-06 DIAGNOSIS — E559 Vitamin D deficiency, unspecified: Secondary | ICD-10-CM

## 2024-05-06 NOTE — Telephone Encounter (Signed)
 Here is an example of a lab appt scheduled for next week, but the lab appt note only says Vitamin D . But there is only a CMP & Urine Micro ordered future on 8/28.

## 2024-05-08 ENCOUNTER — Encounter: Payer: Self-pay | Admitting: Pharmacist

## 2024-05-08 NOTE — Telephone Encounter (Signed)
 Noted & appt note has been updated

## 2024-05-08 NOTE — Progress Notes (Signed)
 Pharmacy Quality Measure Review  SUPD This patient is appearing on the insurance-providing list for being at risk of failing the adherence measure for Statin Use in Persons with Diabetes (SUPD) medications this calendar year.   Is currently prescribed rosuvastatin . Has filled several times in 2025.  KED: Lab Results  Component Value Date   MICRALBCREAT 34.9 (H) 02/14/2024   Lab Results  Component Value Date   GFR 63.85 02/14/2024   CBP: BP Readings from Last 1 Encounters:  02/14/24 (!) 140/70  Does not meet measure criteria of <140/90.

## 2024-05-09 NOTE — Telephone Encounter (Signed)
 open in error

## 2024-05-14 ENCOUNTER — Telehealth: Payer: Self-pay

## 2024-05-14 ENCOUNTER — Ambulatory Visit: Payer: Self-pay

## 2024-05-14 NOTE — Telephone Encounter (Signed)
 FYI Only or Action Required?: Action required by provider: request for appointment. Pt wanting to know if she can be worked in with Dr. Marylynn. No appt availablity with PCP until January. Pt would like to be called at [phone]  Patient was last seen in primary care on 02/14/2024 by Penny Verneita CROME, MD.  Called Nurse Triage reporting Leg Pain.  Symptoms began a week ago.  Interventions attempted: OTC medications: Absorbine jr. ointment.  Symptoms are: unchanged.  Triage Disposition: See PCP Within 2 Weeks  Patient/caregiver understands and will follow disposition?: Yes  Copied from CRM #[id number]. Topic: Clinical - Red Word Triage >> May 14, 2024  1:36 PM [name] F wrote: Red Word that prompted transfer to Nurse Triage: Left leg has been very achy from the hip down to the ankle. She think it could be from rosuvastatin  (CRESTOR ) 5 MG tablet Reason for Disposition  [1] MILD pain (e.g., does not interfere with normal activities) AND [2] present > 7 days  Answer Assessment - Initial Assessment Questions Pt reports onset of mild aching feeling in left leg from hip down to ankle a week ago. Thinks she may have done something to her leg while working out at the gym a week ago. Denies numbness, tingling, redness, swelling, difficulty walking, CP or SOB. Offered appt with different provider at home office d/t no pcp availability in next 2 weeks. Pt declines, wants to know if she can be worked in with PCP. Decliens scheduling at a different office in pt region. Sending pt request to office. Pt would like to be called at [phone] for scheduling. Advised UC or ED for worsening symptoms.    1. ONSET: When did the pain start?      Last week  2. LOCATION: Where is the pain located?      Left leg  3. PAIN: How bad is the pain?    (Scale 1-10; or mild, moderate, severe)     Denies pain, just achey. Intermittent  4. WORK OR EXERCISE: Has there been any recent work or exercise that involved  this part of the body?      Went to the gym recently  5. CAUSE: What do you think is causing the leg pain?     Possibly r/t weightlifting at the gym  6. OTHER SYMPTOMS: Do you have any other symptoms? (e.g., chest pain, back pain, breathing difficulty, swelling, rash, fever, numbness, weakness)     Denies  Protocols used: Leg Pain-A-AH

## 2024-05-14 NOTE — Telephone Encounter (Signed)
 Spoke with pt to let her know that per her last bone desity scan Dr. Tullo recommended that she repeat in 5 years. Pt will be due in 2026. I aske DTaP if she thought she needed to be seen for the left leg soreness. Pt stated that she just sure she needed to be seen right now, she thinks she just over worked it and the muscles are sore. She stated that when she uses the muscle cream the soreness goes away.

## 2024-05-14 NOTE — Telephone Encounter (Signed)
 See previous message routed to Dr. Marylynn.

## 2024-05-14 NOTE — Telephone Encounter (Signed)
 Copied from CRM [id number]. Topic: Clinical - Request for Lab/Test Order >> May 14, 2024  1:34 PM [name] F wrote: Reason for CRM: Pt says she was supposed to been getting a bone density scan done but she has not heard back from anyone. She is calling to check the status of this request as she is beginning to have achy legs and she is not sure if its from her medication or not

## 2024-05-15 ENCOUNTER — Ambulatory Visit

## 2024-05-16 ENCOUNTER — Ambulatory Visit (INDEPENDENT_AMBULATORY_CARE_PROVIDER_SITE_OTHER)

## 2024-05-16 ENCOUNTER — Other Ambulatory Visit

## 2024-05-16 VITALS — BP 130/80 | HR 86 | Temp 99.2°F | Ht 60.0 in | Wt 134.0 lb

## 2024-05-16 DIAGNOSIS — E559 Vitamin D deficiency, unspecified: Secondary | ICD-10-CM

## 2024-05-16 DIAGNOSIS — E1121 Type 2 diabetes mellitus with diabetic nephropathy: Secondary | ICD-10-CM | POA: Diagnosis not present

## 2024-05-16 DIAGNOSIS — M7918 Myalgia, other site: Secondary | ICD-10-CM | POA: Diagnosis not present

## 2024-05-16 DIAGNOSIS — N1831 Chronic kidney disease, stage 3a: Secondary | ICD-10-CM | POA: Insufficient documentation

## 2024-05-16 MED ORDER — CYCLOBENZAPRINE HCL 5 MG PO TABS: 5.0000 mg | ORAL_TABLET | Freq: Every day | ORAL | 0 refills | Status: AC | PRN

## 2024-05-16 MED ORDER — DICLOFENAC SODIUM 1 % EX GEL: 2.0000 g | Freq: Four times a day (QID) | CUTANEOUS | 0 refills | Status: AC

## 2024-05-16 NOTE — Addendum Note (Signed)
 Addended by: TANDA HARVEY D on: 05/16/2024 11:23 AM   Modules accepted: Orders

## 2024-05-16 NOTE — Assessment & Plan Note (Signed)
 Left hip and leg musculoskeletal pain Acute pain post-exercise, likely musculoskeletal strain.  - Take Tylenol  500 mg every 8 hours as needed for pain. - Prescribed muscle relaxer cyclobenzaprine  2.5-5 mg at bedtime for severe pain, to be taken at night if unable to sleep. - Prescribed Voltaren  (diclofenac ) gel 1%,  apply up to four times a day for pain relief. - Recommended massage therapy and use of a soft cushion under the affected area while sleeping.  - Advised to avoid activities that worsen the pain, especially weight machines, for at least three weeks. - Encouraged staying active with walking and other non-painful activities. - Discussed option of seeing a chiropractor or physical therapist if pain persists.

## 2024-05-16 NOTE — Patient Instructions (Addendum)
-   Take Tylenol  500 mg every 8 hourly as needed.  - Take Cyclobenzaprine  5 mg daily as needed for pain. This may cause grogginess when you take it so please don't drive when you take it.  - Stay active as tolerated, avoid activities that worsen the pain.  - Use ice, heat.  - You can try diclofenac  1% gel up to 4 times a day to help with pain. Your insurance may not cover for this as this is over the counter.

## 2024-05-16 NOTE — Progress Notes (Signed)
 Acute Office Visit  Subjective:    Patient ID: Penny Butler, female    DOB: Apr 02, 1951, 73 y.o.   MRN: [id number]  Chief Complaint  Patient presents with   Hip Pain   Leg Pain   Sciatica    Discussed the use of AI scribe software for clinical note transcription with the patient, who gave verbal consent to proceed.  History of Present Illness Penny Butler is a 73 year old female who presents with left gluteal region pain radiating down her left-sided leg pain following exercise about a week ago.  She experiences a throbbing, aching pain that began a day after working out at the gym last week. The pain is located in the left buttock and radiates down to the left leg and ankle. It is described as 'annoying' and sometimes wakes her up at night. She has stopped using weight machines and has been walking instead, but the pain persists after walking and when sitting down. Topical muscle rub provides some relief, and massages from her husband are beneficial.  No urinary or stool incontinence, loss of sensation, numbness, burning, or weakness in the legs. No falls have occurred, and the pain does not worsen with sneezing or walking. She is able to bear weight on the leg.  She has not used over-the-counter medications like ibuprofen. She has been applying OTC muscle relaxing cream and prn tylenol  to help with pain. Denies shortness of breath, palpitations.  ROS As per HPI    Objective:    BP 130/80 (BP Location: Right Arm, Patient Position: Sitting, Cuff Size: Normal)   Pulse 86   Temp 99.2 F (37.3 C) (Oral)   Ht 5' (1.524 m)   Wt 134 lb (60.8 kg)   SpO2 98%   BMI 26.17 kg/m    Physical Exam Constitutional:      Appearance: Normal appearance.  HENT:     Head: Normocephalic and atraumatic.     Mouth/Throat:     Mouth: Mucous membranes are moist.  Neck:     Thyroid : No thyroid  mass or thyroid  tenderness.  Cardiovascular:     Rate and Rhythm: Normal rate and regular  rhythm.  Pulmonary:     Effort: Pulmonary effort is normal.     Breath sounds: Normal breath sounds. No rales.  Abdominal:     General: Bowel sounds are normal.     Palpations: Abdomen is soft.     Tenderness: There is no guarding.  Musculoskeletal:     Cervical back: Neck supple.     Lumbar back: No spasms. Normal range of motion. Negative right straight leg raise test and negative left straight leg raise test. No scoliosis.     Right hip: No bony tenderness. Normal range of motion. Normal strength.     Left hip: No bony tenderness. Normal range of motion. Normal strength.     Right lower leg: No edema.     Left lower leg: No edema.     Right foot: Normal pulse.     Left foot: Normal pulse.     Comments: Pain on palpation over left medial inferior gluteal region  Skin:    General: Skin is warm.  Neurological:     Mental Status: She is alert and oriented to person, place, and time.     Gait: Gait normal.  Psychiatric:        Mood and Affect: Mood normal.        Behavior: Behavior normal.  No results found for any visits on 05/16/24.     Assessment & Plan:  Patient is a pleasant 73 year old female presenting for evaluation of left gluteal pain for 1 week. Vitals reassuring. D/D includes piriformis syndrome,  SI joint dysfunction, lumbar radiculopathy, gluteus medius/minimus tendinopathy, muscle strain.  Assessment & Plan Gluteal pain Left hip and leg musculoskeletal pain Acute pain post-exercise, likely musculoskeletal strain.  - Take Tylenol  500 mg every 8 hours as needed for pain. - Prescribed muscle relaxer cyclobenzaprine  2.5-5 mg at bedtime for severe pain, to be taken at night if unable to sleep. - Prescribed Voltaren  (diclofenac ) gel 1%,  apply up to four times a day for pain relief. - Recommended massage therapy and use of a soft cushion under the affected area while sleeping.  - Advised to avoid activities that worsen the pain, especially weight machines, for at  least three weeks. - Encouraged staying active with walking and other non-painful activities. - Discussed option of seeing a chiropractor or physical therapist if pain persists.     Return if symptoms worsen or fail to improve.  Luke Shade, MD

## 2024-05-19 ENCOUNTER — Ambulatory Visit: Payer: Self-pay | Admitting: Internal Medicine

## 2024-05-19 ENCOUNTER — Other Ambulatory Visit: Payer: Self-pay | Admitting: Internal Medicine

## 2024-05-19 DIAGNOSIS — E1121 Type 2 diabetes mellitus with diabetic nephropathy: Secondary | ICD-10-CM

## 2024-05-19 LAB — COMPREHENSIVE METABOLIC PANEL WITH GFR
ALT: 13 IU/L (ref 0–32)
AST: 17 IU/L (ref 0–40)
Albumin: 4.4 g/dL (ref 3.9–4.9)
Alkaline Phosphatase: 90 IU/L (ref 49–135)
BUN/Creatinine Ratio: 21 (ref 12–28)
BUN: 21 mg/dL (ref 8–27)
Bilirubin Total: 0.5 mg/dL (ref 0.0–1.2)
CO2: 22 mmol/L (ref 20–29)
Calcium: 10.2 mg/dL (ref 8.7–10.3)
Chloride: 105 mmol/L (ref 96–106)
Creatinine, Ser: 1 mg/dL (ref 0.57–1.00)
Globulin, Total: 2.2 g/dL (ref 1.5–4.5)
Glucose: 79 mg/dL (ref 70–99)
Potassium: 4.5 mmol/L (ref 3.5–5.2)
Sodium: 141 mmol/L (ref 134–144)
Total Protein: 6.6 g/dL (ref 6.0–8.5)
eGFR: 61 mL/min/1.73 (ref >59)

## 2024-05-19 LAB — MICROALBUMIN / CREATININE URINE RATIO
Creatinine, Urine: 175.2 mg/dL
Microalb/Creat Ratio: 16 mg/g{creat} (ref 0–29)
Microalbumin, Urine: 27.3 ug/mL

## 2024-05-19 LAB — VITAMIN D 25 HYDROXY (VIT D DEFICIENCY, FRACTURES): Vit D, 25-Hydroxy: 84.1 ng/mL (ref 30.0–100.0)

## 2024-05-21 ENCOUNTER — Other Ambulatory Visit (HOSPITAL_COMMUNITY): Payer: Self-pay

## 2024-05-21 ENCOUNTER — Telehealth: Payer: Self-pay

## 2024-05-21 NOTE — Telephone Encounter (Signed)
 Pharmacy Patient Advocate Encounter   Received notification from Onbase that prior authorization for diclofenac  Sodium (VOLTAREN ) 1 % GEL  is required/requested.   Insurance verification completed.   The patient is insured through [facility].   Per test claim: PA required; PA submitted to above mentioned insurance via Latent Key/confirmation #/EOC [id number] Status is pending

## 2024-05-22 ENCOUNTER — Other Ambulatory Visit: Payer: Self-pay | Admitting: Internal Medicine

## 2024-05-22 DIAGNOSIS — E1121 Type 2 diabetes mellitus with diabetic nephropathy: Secondary | ICD-10-CM

## 2024-05-23 ENCOUNTER — Ambulatory Visit

## 2024-05-23 NOTE — Telephone Encounter (Signed)
 Pharmacy Patient Advocate Encounter  Received notification from [facility] that Prior Authorization for Diclofenac  Sodium 1% gel   has been DENIED.  See denial reason below. No denial letter attached in CMM. Will attach denial letter to Media tab once received. PLEASE BE ADVISE PT CAN GET OTC   Your plan only covers this drug when it is used for certain health conditions. Covered Use Is For Osteoarthritis Pain In Joints That Can Be Treated Topically Such As Feet, Ankles, Knees, Hands, Wrists, Or Elbows. Your plan does not cover this drug for LEFT HIP AND LEG MUSCULOSKELETAL PAIN.   We reviewed the information we had. Your request has been denied. PA #/Case ID/Reference #: [id number]

## 2024-05-24 NOTE — Telephone Encounter (Signed)
 Patient let the patient know she can get Diclofenac  gel/Voltaren  gel 1% over the counter this over the counter as her insurance does not cover for it.  Luke Shade, MD

## 2024-05-30 ENCOUNTER — Ambulatory Visit

## 2024-07-16 ENCOUNTER — Ambulatory Visit: Admitting: *Deleted

## 2024-07-16 VITALS — BP 126/73 | Ht 60.0 in | Wt 135.0 lb

## 2024-07-16 DIAGNOSIS — Z0000 Encounter for general adult medical examination without abnormal findings: Secondary | ICD-10-CM | POA: Diagnosis not present

## 2024-07-16 NOTE — Patient Instructions (Signed)
 Ms. Bratton,  Thank you for taking the time for your Medicare Wellness Visit. I appreciate your continued commitment to your health goals. Please review the care plan we discussed, and feel free to reach out if I can assist you further.  Please note that Annual Wellness Visits do not include a physical exam. Some assessments may be limited, especially if the visit was conducted virtually. If needed, we may recommend an in-person follow-up with your provider.  Ongoing Care Seeing your primary care provider every 3 to 6 months helps us  monitor your health and provide consistent, personalized care.  Consider updating you covid vaccine.  Referrals If a referral was made during today's visit and you haven't received any updates within two weeks, please contact the referred provider directly to check on the status.  Recommended Screenings:  Health Maintenance  Topic Date Due   COVID-19 Vaccine (4 - 2025-26 season) 02/20/2024   Hemoglobin A1C  08/16/2024   Kidney health urinalysis for diabetes  11/13/2024   Eye exam for diabetics  12/13/2024   Breast Cancer Screening  01/24/2025   Complete foot exam   02/13/2025   Yearly kidney function blood test for diabetes  05/16/2025   Medicare Annual Wellness Visit  07/16/2025   Cologuard (Stool DNA test)  02/20/2027   DTaP/Tdap/Td vaccine (2 - Td or Tdap) 01/02/2034   Pneumococcal Vaccine for age over 16  Completed   Flu Shot  Completed   Osteoporosis screening with Bone Density Scan  Completed   Hepatitis C Screening  Completed   Zoster (Shingles) Vaccine  Completed   Meningitis B Vaccine  Aged Out   Colon Cancer Screening  Discontinued       07/16/2024    1:46 PM  Advanced Directives  Does Patient Have a Medical Advance Directive? No    Vision: Annual vision screenings are recommended for early detection of glaucoma, cataracts, and diabetic retinopathy. These exams can also reveal signs of chronic conditions such as diabetes and high blood  pressure.  Dental: Annual dental screenings help detect early signs of oral cancer, gum disease, and other conditions linked to overall health, including heart disease and diabetes.  Please see the attached documents for additional preventive care recommendations.

## 2024-07-16 NOTE — Progress Notes (Signed)
 "  Chief Complaint  Patient presents with   Medicare Wellness     Subjective:   Penny Butler is a 74 y.o. female who presents for a Medicare Annual Wellness Visit.  Visit info / Clinical Intake: Medicare Wellness Visit Type:: Subsequent Annual Wellness Visit Persons participating in visit and providing information:: patient Medicare Wellness Visit Mode:: Telephone If telephone:: video declined Since this visit was completed virtually, some vitals may be partially provided or unavailable. Missing vitals are due to the limitations of the virtual format.: Documented vitals are patient reported If Telephone or Video please confirm:: I connected with patient using audio/video enable telemedicine. I verified patient identity with two identifiers, discussed telehealth limitations, and patient agreed to proceed. Patient Location:: Home Provider Location:: Office/Home Interpreter Needed?: No Pre-visit prep was completed: yes AWV questionnaire completed by patient prior to visit?: no Living arrangements:: lives with spouse/significant other Patient's Overall Health Status Rating: good Typical amount of pain: none Does pain affect daily life?: no Are you currently prescribed opioids?: no  Dietary Habits and Nutritional Risks How many meals a day?: 3 Eats fruit and vegetables daily?: yes Most meals are obtained by: preparing own meals In the last 2 weeks, have you had any of the following?: none Diabetic:: (!) yes Any non-healing wounds?: no How often do you check your BS?: as needed Would you like to be referred to a Nutritionist or for Diabetic Management? : no  Functional Status Activities of Daily Living (to include ambulation/medication): Independent Ambulation: Independent Medication Administration: Independent Home Management (perform basic housework or laundry): Independent Manage your own finances?: yes Primary transportation is: driving Concerns about vision?: no *vision  screening is required for WTM* Concerns about hearing?: (!) yes (some hearing loss in right ear, declines visit at this time) Uses hearing aids?: no Hear whispered voice?: -- (televisit)  Fall Screening Falls in the past year?: 0 Number of falls in past year: 0 Was there an injury with Fall?: 0 Fall Risk Category Calculator: 0 Patient Fall Risk Level: Low Fall Risk  Fall Risk Patient at Risk for Falls Due to: No Fall Risks Fall risk Follow up: Falls evaluation completed; Falls prevention discussed  Home and Transportation Safety: All rugs have non-skid backing?: N/A, no rugs All stairs or steps have railings?: yes Grab bars in the bathtub or shower?: yes Have non-skid surface in bathtub or shower?: yes Good home lighting?: yes Regular seat belt use?: yes Hospital stays in the last year:: no  Cognitive Assessment Difficulty concentrating, remembering, or making decisions? : no Will 6CIT or Mini Cog be Completed: yes What year is it?: 0 points What month is it?: 0 points Give patient an address phrase to remember (5 components): [address] [place] About what time is it?: 0 points Count backwards from 20 to 1: 0 points Say the months of the year in reverse: 0 points Repeat the address phrase from earlier: 2 points (said Spring) 6 CIT Score: 2 points  Advance Directives (For Healthcare) Does Patient Have a Medical Advance Directive?: No (Patient will pick up paperwork at next visit)  Reviewed/Updated  Reviewed/Updated: Reviewed All (Medical, Surgical, Family, Medications, Allergies, Care Teams, Patient Goals)    Allergies (verified) Fentanyl , Hydrocodone -acetaminophen , Jardiance  [empagliflozin ], and Other   Current Medications (verified) Outpatient Encounter Medications as of 07/16/2024  Medication Sig   b complex vitamins capsule Take 1 capsule by mouth daily.   Multiple Vitamin (MULTIVITAMIN) tablet Take 1 tablet by mouth daily.   OneTouch Delica  Lancets 30G  MISC USE AS DIRECTED EVERY DAY   ONETOUCH VERIO test strip USE AS DIRECTED ONCE PER DAY   progesterone  (PROMETRIUM ) 200 MG capsule Take 200 mg by mouth at bedtime.   rosuvastatin  (CRESTOR ) 5 MG tablet TAKE 1 TABLET (5 MG TOTAL) BY MOUTH DAILY.   Telmisartan -amLODIPine  40-5 MG TABS Take 1 tablet by mouth at bedtime.   testosterone  (ANDROGEL ) 50 MG/5GM (1%) GEL at bedtime. On Testosterone  low dose cream from [facility]   tirzepatide  (MOUNJARO ) 12.5 MG/0.5ML Pen INJECT 12.5 MG SUBCUTANEOUSLY ONE TIME PER WEEK   UNABLE TO FIND Med Name: Testosterone  cream   cyclobenzaprine  (FLEXERIL ) 5 MG tablet Take 1 tablet (5 mg total) by mouth daily as needed for muscle spasms. (Patient not taking: Reported on 07/16/2024)   diclofenac  Sodium (VOLTAREN ) 1 % GEL Apply 2 g topically 4 (four) times daily. (Patient not taking: Reported on 07/16/2024)   VITAMIN D  PO Take by mouth daily. (Patient not taking: Reported on 07/16/2024)   [DISCONTINUED] Cyanocobalamin (VITAMIN B-12 SL) Place under the tongue daily. (Patient not taking: Reported on 07/16/2024)   No facility-administered encounter medications on file as of 07/16/2024.    History: Past Medical History:  Diagnosis Date   Hyperlipidemia    Hypertension    Obesity    OSA (obstructive sleep apnea)    now off of CAPAP without symptoms    Type 2 diabetes mellitus (HCC)    Past Surgical History:  Procedure Laterality Date   ABDOMINAL HYSTERECTOMY     BILATERAL OOPHORECTOMY  06/21/2005   [place]    CATARACT EXTRACTION W/PHACO Right 12/29/2022   Procedure: CATARACT EXTRACTION PHACO AND INTRAOCULAR LENS PLACEMENT (IOC) RIGHT 4.98 00:42:4;  Surgeon: Mittie Gaskin, MD;  Location: [facility];  Service: Ophthalmology;  Laterality: Right;  Diabetic   CATARACT EXTRACTION W/PHACO Left 01/05/2023   Procedure: CATARACT EXTRACTION PHACO AND INTRAOCULAR LENS PLACEMENT (IOC) LEFT   DIABETIC 4.05 00:35.0;  Surgeon: Mittie Gaskin, MD;  Location: [facility];  Service: Ophthalmology;  Laterality: Left;  Diabetic   COLONOSCOPY     Family History  Problem Relation Age of Onset   Breast cancer Sister 61   Heart disease Mother    Lung cancer Mother    Heart disease Father 55   Lung cancer Father    Social History   Occupational History   Not on file  Tobacco Use   Smoking status: Never   Smokeless tobacco: Never  Vaping Use   Vaping status: Former  Substance and Sexual Activity   Alcohol use: Not Currently    Comment: Few drinks a year   Drug use: No   Sexual activity: Yes   Tobacco Counseling Counseling given: Not Answered  SDOH Screenings   Food Insecurity: No Food Insecurity (07/16/2024)  Housing: Low Risk (07/16/2024)  Transportation Needs: No Transportation Needs (07/16/2024)  Utilities: Not At Risk (07/16/2024)  Alcohol Screen: Low Risk (07/16/2024)  Depression (PHQ2-9): Low Risk (07/16/2024)  Financial Resource Strain: Low Risk (07/16/2024)  Physical Activity: Sufficiently Active (07/16/2024)  Social Connections: Socially Integrated (07/16/2024)  Stress: No Stress Concern Present (07/16/2024)  Tobacco Use: Low Risk (07/16/2024)  Health Literacy: Adequate Health Literacy (07/16/2024)   See flowsheets for full screening details  Depression Screen Depression Screening Exception Documentation Depression Screening Exception:: Patient refusal  PHQ 2 & 9 Depression Scale- Over the past 2 weeks, how often have you been bothered by any of the following problems? Little interest or pleasure in  doing things: 0 Feeling down, depressed, or hopeless (PHQ Adolescent also includes...irritable): 0 PHQ-2 Total Score: 0 Trouble falling or staying asleep, or sleeping too much: 0 Feeling tired or having little energy: 0 Poor appetite or overeating (PHQ Adolescent also includes...weight loss): 0 Feeling bad about yourself - or that you are a failure or have let yourself or your family down:  0 Trouble concentrating on things, such as reading the newspaper or watching television (PHQ Adolescent also includes...like school work): 0 Moving or speaking so slowly that other people could have noticed. Or the opposite - being so fidgety or restless that you have been moving around a lot more than usual: 0 Thoughts that you would be better off dead, or of hurting yourself in some way: 0 PHQ-9 Total Score: 0 If you checked off any problems, how difficult have these problems made it for you to do your work, take care of things at home, or get along with other people?: Not difficult at all     Goals Addressed             This Visit's Progress    Patient Stated       Wants to eat the way she should and continue to work out             Objective:    Today's Vitals   07/16/24 1339  BP: 126/73  Weight: 135 lb (61.2 kg)  Height: 5' (1.524 m)   Body mass index is 26.37 kg/m.  Hearing/Vision screen Hearing Screening - Comments:: Some issues with right ear Vision Screening - Comments:: Readers, Mevelyn, up to date Immunizations and Health Maintenance Health Maintenance  Topic Date Due   Colonoscopy  12/24/2020   COVID-19 Vaccine (4 - 2025-26 season) 02/20/2024   HEMOGLOBIN A1C  08/16/2024   Diabetic kidney evaluation - Urine ACR  11/13/2024   OPHTHALMOLOGY EXAM  12/13/2024   Mammogram  01/24/2025   FOOT EXAM  02/13/2025   Diabetic kidney evaluation - eGFR measurement  05/16/2025   Medicare Annual Wellness (AWV)  07/16/2025   DTaP/Tdap/Td (2 - Td or Tdap) 01/02/2034   Pneumococcal Vaccine: 50+ Years  Completed   Influenza Vaccine  Completed   Bone Density Scan  Completed   Hepatitis C Screening  Completed   Zoster Vaccines- Shingrix  Completed   Meningococcal B Vaccine  Aged Out        Assessment/Plan:  This is a routine wellness examination for Penny Butler.  Patient Care Team: Marylynn Verneita CROME, MD as PCP - General (Internal Medicine) Douglas Rule, MD as  Consulting Physician (Nephrology) Deanne Delon CROME, NP (Nurse Practitioner)  I have personally reviewed and noted the following in the patients chart:   Medical and social history Use of alcohol, tobacco or illicit drugs  Current medications and supplements including opioid prescriptions. Functional ability and status Nutritional status Physical activity Advanced directives List of other physicians Hospitalizations, surgeries, and ER visits in previous 12 months Vitals Screenings to include cognitive, depression, and falls Referrals and appointments  No orders of the defined types were placed in this encounter.  In addition, I have reviewed and discussed with patient certain preventive protocols, quality metrics, and best practice recommendations. A written personalized care plan for preventive services as well as general preventive health recommendations were provided to patient.   Angeline Fredericks, LPN   [id number]/73/7973   Return in 1 year (on 07/16/2025).  After Visit Summary: (MyChart) Due to this being a telephonic visit, the after  visit summary with patients personalized plan was offered to patient via MyChart   Nurse Notes: Patient declines covid vaccine "

## 2024-08-17 ENCOUNTER — Ambulatory Visit: Admitting: Internal Medicine

## 2025-07-22 ENCOUNTER — Ambulatory Visit
